# Patient Record
Sex: Male | Born: 1970 | Race: White | Hispanic: No | Smoking: Never smoker
Health system: Southern US, Community
[De-identification: ages and names within clinical notes are randomized; demographics above are authoritative.]

## PROBLEM LIST (undated history)

## (undated) DIAGNOSIS — F419 Anxiety disorder, unspecified: Secondary | ICD-10-CM

## (undated) DIAGNOSIS — F13239 Sedative, hypnotic or anxiolytic dependence with withdrawal, unspecified: Secondary | ICD-10-CM

## (undated) DIAGNOSIS — N189 Chronic kidney disease, unspecified: Secondary | ICD-10-CM

## (undated) DIAGNOSIS — N028 Recurrent and persistent hematuria with other morphologic changes: Secondary | ICD-10-CM

## (undated) DIAGNOSIS — F32A Depression, unspecified: Secondary | ICD-10-CM

## (undated) DIAGNOSIS — R011 Cardiac murmur, unspecified: Secondary | ICD-10-CM

## (undated) DIAGNOSIS — F13939 Sedative, hypnotic or anxiolytic use, unspecified with withdrawal, unspecified: Secondary | ICD-10-CM

## (undated) DIAGNOSIS — G4730 Sleep apnea, unspecified: Secondary | ICD-10-CM

## (undated) DIAGNOSIS — N02B9 Other recurrent and persistent immunoglobulin A nephropathy: Secondary | ICD-10-CM

## (undated) DIAGNOSIS — F329 Major depressive disorder, single episode, unspecified: Secondary | ICD-10-CM

## (undated) DIAGNOSIS — I10 Essential (primary) hypertension: Secondary | ICD-10-CM

## (undated) HISTORY — PX: OTHER SURGICAL HISTORY: SHX169

## (undated) HISTORY — DX: Depression, unspecified: F32.A

## (undated) HISTORY — DX: Other recurrent and persistent immunoglobulin A nephropathy: N02.B9

## (undated) HISTORY — DX: Recurrent and persistent hematuria with other morphologic changes: N02.8

## (undated) HISTORY — PX: PECTUS EXCAVATUM REPAIR: SHX437

## (undated) HISTORY — DX: Chronic kidney disease, unspecified: N18.9

## (undated) HISTORY — DX: Major depressive disorder, single episode, unspecified: F32.9

## (undated) HISTORY — DX: Cardiac murmur, unspecified: R01.1

---

## 2009-10-30 ENCOUNTER — Inpatient Hospital Stay (HOSPITAL_COMMUNITY): Admission: RE | Admit: 2009-10-30 | Discharge: 2009-11-02 | Payer: Self-pay | Admitting: Psychiatry

## 2009-10-30 ENCOUNTER — Ambulatory Visit: Payer: Self-pay | Admitting: Psychiatry

## 2010-05-11 LAB — COMPREHENSIVE METABOLIC PANEL
BUN: 16 mg/dL (ref 6–23)
Calcium: 8.6 mg/dL (ref 8.4–10.5)
Creatinine, Ser: 1.82 mg/dL — ABNORMAL HIGH (ref 0.4–1.5)
Glucose, Bld: 101 mg/dL — ABNORMAL HIGH (ref 70–99)
Sodium: 135 mEq/L (ref 135–145)
Total Protein: 7.3 g/dL (ref 6.0–8.3)

## 2010-05-11 LAB — CBC
HCT: 48.7 % (ref 39.0–52.0)
MCHC: 34.3 g/dL (ref 30.0–36.0)
MCV: 93 fL (ref 78.0–100.0)
RDW: 12.9 % (ref 11.5–15.5)

## 2010-05-11 LAB — TSH: TSH: 1.168 u[IU]/mL (ref 0.350–4.500)

## 2011-02-08 ENCOUNTER — Ambulatory Visit (INDEPENDENT_AMBULATORY_CARE_PROVIDER_SITE_OTHER): Payer: BC Managed Care – PPO

## 2011-02-08 DIAGNOSIS — Z4802 Encounter for removal of sutures: Secondary | ICD-10-CM

## 2011-10-01 ENCOUNTER — Ambulatory Visit (INDEPENDENT_AMBULATORY_CARE_PROVIDER_SITE_OTHER): Payer: BC Managed Care – PPO | Admitting: Family Medicine

## 2011-10-01 VITALS — BP 128/78 | HR 100 | Temp 98.2°F | Resp 16 | Ht 76.25 in | Wt 247.2 lb

## 2011-10-01 DIAGNOSIS — L237 Allergic contact dermatitis due to plants, except food: Secondary | ICD-10-CM

## 2011-10-01 DIAGNOSIS — L255 Unspecified contact dermatitis due to plants, except food: Secondary | ICD-10-CM

## 2011-10-01 DIAGNOSIS — R21 Rash and other nonspecific skin eruption: Secondary | ICD-10-CM

## 2011-10-01 MED ORDER — METHYLPREDNISOLONE SODIUM SUCC 125 MG IJ SOLR
125.0000 mg | Freq: Once | INTRAMUSCULAR | Status: AC
  Administered 2011-10-01: 125 mg via INTRAMUSCULAR

## 2011-10-01 MED ORDER — METHYLPREDNISOLONE 4 MG PO KIT: PACK | ORAL | Status: AC

## 2011-10-01 NOTE — Progress Notes (Signed)
[facility]:  Office Visit  Chief Complaint:  Chief Complaint  Patient presents with  . Poison Ivy    had for a while    HPI: Kelly Johnston is a 41 y.o. male who complains of  Poison ivy x 2 weeks. Developed after working out in yard. Rash behind ears, groin, legs;  Denies skin infection. Used calamine lotion without relief.  Past Medical History  Diagnosis Date  . Depression    Past Surgical History  Procedure Date  . Chest surgery    History   Social History  . Marital Status: Married    Spouse Name: N/A    Number of Children: N/A  . Years of Education: N/A   Social History Main Topics  . Smoking status: Never Smoker   . Smokeless tobacco: None  . Alcohol Use: No  . Drug Use: No  . Sexually Active: None   Other Topics Concern  . None   Social History Narrative  . None   No family history on file. No Known Allergies Prior to Admission medications   Medication Sig Start Date End Date Taking? Authorizing Provider  venlafaxine XR (EFFEXOR-XR) 150 MG 24 hr capsule Take 150 mg by mouth daily.   Yes Historical Provider, MD     ROS: The patient denies fevers, chills, night sweats, unintentional weight loss, chest pain, palpitations, wheezing, dyspnea on exertion, nausea, vomiting, abdominal pain, dysuria, hematuria, melena, numbness, weakness, or tingling.   All other systems have been reviewed and were otherwise negative with the exception of those mentioned in the HPI and as above.    PHYSICAL EXAM: Filed Vitals:   10/01/11 1314  BP: 128/78  Pulse: 109  Temp: 98.2 F (36.8 C)  Resp: 16   Filed Vitals:   10/01/11 1314  Height: 6' 4.25" (1.937 m)  Weight: 247 lb 3.2 oz (112.129 kg)   Body mass index is 29.89 kg/(m^2). Repeat 100  General: Alert, no acute distress HEENT:  Normocephalic, atraumatic, oropharynx patent.  Cardiovascular:  Regular rate and rhythm, no rubs murmurs or gallops.  No Carotid bruits, radial pulse intact. No  pedal edema.  Respiratory: Clear to auscultation bilaterally.  No wheezes, rales, or rhonchi.  No cyanosis, no use of accessory musculature GI: No organomegaly, abdomen is soft and non-tender, positive bowel sounds.  No masses. Skin: + erythematous rash, no  Signs of infection Neurologic: Facial musculature symmetric. Psychiatric: Patient is appropriate throughout our interaction. Lymphatic: No cervical lymphadenopathy Musculoskeletal: Gait intact.   LABS: Results for orders placed during the hospital encounter of 10/30/09  CBC      Component Value Range   WBC 9.7  4.0 - 10.5 K/uL   RBC 5.24  4.22 - 5.81 MIL/uL   Hemoglobin 16.7  13.0 - 17.0 g/dL   HCT 95.6  21.3 - 08.6 %   MCV 93.0  78.0 - 100.0 fL   MCH 31.9  26.0 - 34.0 pg   MCHC 34.3  30.0 - 36.0 g/dL   RDW 57.8  46.9 - 62.9 %   Platelets 321  150 - 400 K/uL  COMPREHENSIVE METABOLIC PANEL      Component Value Range   Sodium 135  135 - 145 mEq/L   Potassium 3.8  3.5 - 5.1 mEq/L   Chloride 101  96 - 112 mEq/L   CO2 27  19 - 32 mEq/L   Glucose, Bld 101 (*) 70 - 99 mg/dL   BUN 16  6 -  23 mg/dL   Creatinine, Ser 1.61 (*) 0.4 - 1.5 mg/dL   Calcium 8.6  8.4 - 09.6 mg/dL   Total Protein 7.3  6.0 - 8.3 g/dL   Albumin 4.4  3.5 - 5.2 g/dL   AST 33  0 - 37 U/L   ALT 22  0 - 53 U/L   Alkaline Phosphatase 64  39 - 117 U/L   Total Bilirubin 1.1  0.3 - 1.2 mg/dL   GFR calc non Af Amer 41 (*) >60 mL/min   GFR calc Af Amer   (*) >60 mL/min   Value: 50            The eGFR has been calculated     using the MDRD equation.     This calculation has not been     validated in all clinical     situations.     eGFR's persistently     <60 mL/min signify     possible Chronic Kidney Disease.  TSH      Component Value Range   TSH 1.168  0.350 - 4.500 uIU/mL     EKG/XRAY:   Primary read interpreted by Dr. Conley Rolls at [facility].   ASSESSMENT/PLAN: Encounter Diagnoses  Name Primary?  . Poison ivy dermatitis Yes  . Rash    Rx Medrol dose  pack Take antihistamine OTC IM Solumedrol given in clinic    Korra Christine PHUONG, DO 10/01/2011 1:30 PM

## 2011-10-04 ENCOUNTER — Ambulatory Visit (INDEPENDENT_AMBULATORY_CARE_PROVIDER_SITE_OTHER): Payer: BC Managed Care – PPO | Admitting: Emergency Medicine

## 2011-10-04 VITALS — BP 132/94 | HR 94 | Temp 98.3°F | Resp 16 | Ht 74.0 in | Wt 246.0 lb

## 2011-10-04 DIAGNOSIS — L255 Unspecified contact dermatitis due to plants, except food: Secondary | ICD-10-CM

## 2011-10-04 MED ORDER — PREDNISONE 10 MG PO TABS: ORAL_TABLET | ORAL | Status: DC

## 2011-10-04 MED ORDER — HYDROXYZINE HCL 25 MG PO TABS: 25.0000 mg | ORAL_TABLET | Freq: Every evening | ORAL | Status: AC | PRN

## 2011-10-04 NOTE — Progress Notes (Signed)
  Subjective:    Patient ID: Kelly Johnston, male    DOB: 12-19-1970, 41 y.o.   MRN: [id number]  HPI See previous visit 10/01/11  Seen for PI and given Solu Medrol 125 IM and po 4 mg methylprednisone dose pack. He states that he is much worse today and was told to come back.   He says his dose today was only 2 tablets. The rash has spread and is worse on arms trunk and groin. He can not sleep because of the itching. Benadryl has not been very helpful.     Review of Systems As noted in HPI, otherwise negative     Objective:   Physical Exam  Multiple scattered areas of vesicular patches on red base along with linear vesicular lesions. Face spared.      Assessment & Plan:  Contact Dermatitis  Change to Prednisone 10 mg 6,5,4,3,2,1 taper Add atarax 25 mg po qhs

## 2012-01-03 ENCOUNTER — Ambulatory Visit (INDEPENDENT_AMBULATORY_CARE_PROVIDER_SITE_OTHER): Payer: BC Managed Care – PPO | Admitting: Internal Medicine

## 2012-01-03 VITALS — BP 144/90 | HR 100 | Temp 98.0°F | Resp 17 | Ht 77.0 in | Wt 266.0 lb

## 2012-01-03 DIAGNOSIS — N529 Male erectile dysfunction, unspecified: Secondary | ICD-10-CM

## 2012-01-03 DIAGNOSIS — Z719 Counseling, unspecified: Secondary | ICD-10-CM

## 2012-01-03 MED ORDER — SILDENAFIL CITRATE 100 MG PO TABS: 100.0000 mg | ORAL_TABLET | Freq: Every day | ORAL | Status: DC | PRN

## 2012-01-03 NOTE — Patient Instructions (Signed)
Erectile Dysfunction  Erectile dysfunction (ED) is the inability to get a good enough erection to have sexual intercourse. ED may involve:  · Inability to get an erection.  · Lack of enough hardness to allow penetration.  · Loss of the erection before sex is finished.  · Premature ejaculation.  · Any combination of these problems if they occur more than 25% of the time.  CAUSES  · Certain drugs, such as:  · Pain relievers.  · Antihistamines.  · Antidepressants.  · Blood pressure medicines.  · Water pills.  · Ulcer medicines.  · Muscle relaxants.  · Illegal drugs.  · Excessive drinking.  · Psychological causes, such as:  · Anxiety.  · Depression.  · Sadness.  · Exhaustion.  · Performance fear.  · Stress.  · Physical causes, such as:  · Artery problems. This may include diabetes, smoking, liver disease, or atherosclerosis.  · High blood pressure.  · Hormonal problems, such as low testosterone.  · Obesity.  · Nerve problems. This may include back or pelvic injuries, diabetes, multiple sclerosis, Parkinson's disease, or some surgeries.  SYMPTOMS  · Inability to get an erection.  · Lack of enough hardness to allow penetration.  · Loss of the erection before sex is finished.  · Premature ejaculation.  · Normal erections at some times, but with frequent unsatisfactory episodes.  · Orgasms that are not satisfactory in sensation or frequency.  · Low sexual satisfaction in either partner because of erection problems.  · A curved penis occurring with erection. The curve may cause pain or may be too curved to allow for intercourse.  · Never having nighttime erections.  DIAGNOSIS  Your caregiver can often diagnose this condition by:  · Performing a physical exam to find other diseases or specific problems with the penis.  · Asking you detailed questions about the problem.  · Performing blood tests to check for diabetes or to measure hormone levels.  · Performing urine tests to find other underlying health  conditions.  · Performing an ultrasound to check for scarring.  · Performing a test to check blood flow to the penis.  · Doing a sleep study at home to measure nighttime erections.  TREATMENT   · You may be prescribed medicines by mouth.  · You may be given medicine injections into the penis.  · You may be prescribed a vacuum pump with a ring.  · Penile implant surgery may be performed. You may receive:  · An inflatable implant.  · A semi-rigid implant.  · Blood vessel surgery may be performed.  HOME CARE INSTRUCTIONS  · Take all medicine as directed by your caregiver. Do not take any other medicines without talking to your caregiver first.  · Follow your caregiver's directions for specific treatments as prescribed.  · Follow up with your caregiver as directed.  Document Released: 02/10/2000 Document Revised: 05/07/2011 Document Reviewed: 06/04/2010  [employer]® Patient Information ©2013 [employer].

## 2012-01-03 NOTE — Progress Notes (Signed)
  Subjective:    Patient ID: Kelly Johnston, male    DOB: 1970/10/14, 41 y.o.   MRN: [id number]  HPI ED using viagra with success. No side affects, no other issues    Review of Systems     Objective:   Physical Exam nl       Assessment & Plan:  SED/viagra rf

## 2012-01-10 ENCOUNTER — Encounter (HOSPITAL_COMMUNITY): Payer: Self-pay | Admitting: Emergency Medicine

## 2012-01-10 ENCOUNTER — Emergency Department (HOSPITAL_COMMUNITY)
Admission: EM | Admit: 2012-01-10 | Discharge: 2012-01-10 | Disposition: A | Discharge status: Home / Self Care | Payer: BC Managed Care – PPO | Attending: Emergency Medicine | Admitting: Emergency Medicine

## 2012-01-10 ENCOUNTER — Ambulatory Visit (INDEPENDENT_AMBULATORY_CARE_PROVIDER_SITE_OTHER): Payer: BC Managed Care – PPO | Admitting: Family Medicine

## 2012-01-10 VITALS — BP 122/85 | HR 95 | Temp 98.5°F | Resp 16 | Ht 75.75 in | Wt 260.0 lb

## 2012-01-10 DIAGNOSIS — N028 Recurrent and persistent hematuria with other morphologic changes: Secondary | ICD-10-CM

## 2012-01-10 DIAGNOSIS — R1110 Vomiting, unspecified: Secondary | ICD-10-CM

## 2012-01-10 DIAGNOSIS — F3289 Other specified depressive episodes: Secondary | ICD-10-CM | POA: Insufficient documentation

## 2012-01-10 DIAGNOSIS — L0291 Cutaneous abscess, unspecified: Secondary | ICD-10-CM

## 2012-01-10 DIAGNOSIS — Z79899 Other long term (current) drug therapy: Secondary | ICD-10-CM | POA: Insufficient documentation

## 2012-01-10 DIAGNOSIS — L03116 Cellulitis of left lower limb: Secondary | ICD-10-CM

## 2012-01-10 DIAGNOSIS — R6889 Other general symptoms and signs: Secondary | ICD-10-CM | POA: Insufficient documentation

## 2012-01-10 DIAGNOSIS — L02419 Cutaneous abscess of limb, unspecified: Secondary | ICD-10-CM | POA: Insufficient documentation

## 2012-01-10 DIAGNOSIS — N189 Chronic kidney disease, unspecified: Secondary | ICD-10-CM | POA: Insufficient documentation

## 2012-01-10 DIAGNOSIS — F329 Major depressive disorder, single episode, unspecified: Secondary | ICD-10-CM | POA: Insufficient documentation

## 2012-01-10 DIAGNOSIS — R238 Other skin changes: Secondary | ICD-10-CM

## 2012-01-10 LAB — POCT CBC
Lymph, poc: 2.6 (ref 0.6–3.4)
MCH, POC: 29.1 pg (ref 27–31.2)
MCV: 95.7 fL (ref 80–97)
MID (cbc): 1.6 — AB (ref 0–0.9)
POC LYMPH PERCENT: 12.2 %L (ref 10–50)
Platelet Count, POC: 356 10*3/uL (ref 142–424)
RDW, POC: 13.1 %
WBC: 21.6 10*3/uL — AB (ref 4.6–10.2)

## 2012-01-10 MED ORDER — VANCOMYCIN HCL IN DEXTROSE 1-5 GM/200ML-% IV SOLN
1000.0000 mg | Freq: Once | INTRAVENOUS | Status: AC
  Administered 2012-01-10: 1000 mg via INTRAVENOUS
  Filled 2012-01-10: qty 200

## 2012-01-10 MED ORDER — SODIUM CHLORIDE 0.9 % IV BOLUS (SEPSIS)
1000.0000 mL | Freq: Once | INTRAVENOUS | Status: AC
  Administered 2012-01-10: 1000 mL via INTRAVENOUS

## 2012-01-10 MED ORDER — OXYCODONE-ACETAMINOPHEN 5-325 MG PO TABS: 1.0000 | ORAL_TABLET | ORAL | Status: DC | PRN

## 2012-01-10 MED ORDER — SULFAMETHOXAZOLE-TRIMETHOPRIM 800-160 MG PO TABS: 1.0000 | ORAL_TABLET | Freq: Two times a day (BID) | ORAL | Status: DC

## 2012-01-10 MED ORDER — KETOROLAC TROMETHAMINE 15 MG/ML IJ SOLN
15.0000 mg | Freq: Once | INTRAMUSCULAR | Status: AC
  Administered 2012-01-10: 15 mg via INTRAVENOUS
  Filled 2012-01-10: qty 1

## 2012-01-10 NOTE — Progress Notes (Signed)
[facility] [address] [place] [address] [phone]  Date:  01/10/2012   Name:  Kelly Johnston   DOB:  1971/02/14   MRN:  [id number]  PCP:  No primary provider on file.    Chief Complaint: cellulitis   History of Present Illness:  Kelly Johnston is a 41 y.o. very pleasant male patient who presents with the following:  Here today with illness- he has noted a fever and chills/ sweats/ malaise for about 2 days. He also has cellulitis of his left thigh which he thinks could be the culprit. He is not aware of any injury to the area or anything that could have caused the cellulitis.  He has never had this in the past.  He has not noted a ST, cough, or any other specific symptoms.  He has not been having any nausea or vomiting.  However, today while in triage he felt very ill and faint, and nearly passed out.  He was transferred to a room by [facility] and an IV was started for hydration   He has a history of IgA nephropathy.  He is seen at [facility], and states that this condition is stable and not generally troublesome to him.  No recent labs available.  He is otherwise generally healthy.   He is married and has 3 children.  His wife is out of town today.    There is no problem list on file for this patient.   Past Medical History  Diagnosis Date  . Depression   . Chronic kidney disease   . Heart murmur     Past Surgical History  Procedure Date  . Chest surgery   . Pectus excavatum repair     History  Substance Use Topics  . Smoking status: Never Smoker   . Smokeless tobacco: Not on file  . Alcohol Use: No    No family history on file.  No Known Allergies  Medication list has been reviewed and updated.  Current Outpatient Prescriptions on File Prior to Visit  Medication Sig Dispense Refill  . liothyronine (CYTOMEL) 25 MCG tablet Take 25 mcg by mouth daily.      . mirtazapine (REMERON) 30 MG tablet Take 30 mg by mouth at bedtime.      .  predniSONE (DELTASONE) 10 MG tablet 6,5,4,3,2,1 taper  21 tablet  0  . sildenafil (VIAGRA) 100 MG tablet Take 100 mg by mouth daily as needed.      . sildenafil (VIAGRA) 100 MG tablet Take 1 tablet (100 mg total) by mouth daily as needed for erectile dysfunction.  10 tablet  3  . venlafaxine XR (EFFEXOR-XR) 150 MG 24 hr capsule Take 150 mg by mouth daily.      . verapamil (CALAN) 40 MG tablet Take 40 mg by mouth 3 (three) times daily.        Review of Systems:  As per HPI- otherwise negative.   Physical Examination: Filed Vitals:   01/10/12 1032  Pulse: 114  Temp: 97.8 F (36.6 C)  Resp: 16   Filed Vitals:   01/10/12 1032  Height: 6' 3.75" (1.924 m)  Weight: 260 lb (117.935 kg)   Body mass index is 31.86 kg/(m^2). Ideal Body Weight: Weight in (lb) to have BMI = 25: 203.6   GEN: WDWN, NAD, Non-toxic, A & O x 3- clammy and pale in triage. slightly overweight HEENT: Atraumatic, Normocephalic. Neck supple. No masses, No LAD. Bilateral TM wnl,  oropharynx normal.  PEERL,EOMI.   Ears and Nose: No external deformity. CV: RRR, No M/G/R. No JVD. No thrill. No extra heart sounds. PULM: CTA B, no wheezes, crackles, rhonchi. No retractions. No resp. distress. No accessory muscle use. ABD: S, NT, ND, +BS. No rebound. No HSM. EXTR: No c/c/e. Left proximal inner thigh shows an area of cellulitis about 4X 6 inches in size.  No apparent fluctuance or wound, no drainage NEURO Normal gait once recovered from pre- syncopal episode  PSYCH: Normally interactive. Conversant. Not depressed or anxious appearing.  Calm demeanor.   IV started left AC with NS for hydration.  Pt laid down and felt better- drank apple juice.  VS checked regularly.    Discussed attempting an I and D on left leg.  He would like to proceed.  Area prepped with betadine and alcohol, anesthesia with 4cc of 1% lidocaine.  Small incision made with 11 blade and pus released immediately.  Culture taken. A small to moderate amount of  pus was expressed.  Explored wound superficially with a curved hemostat but no more loculations or pus found.  Small piece of packing placed and wound dressed.    Results for orders placed in visit on 01/10/12  POCT CBC      Component Value Range   WBC 21.6 (*) 4.6 - 10.2 K/uL   Lymph, poc 2.6  0.6 - 3.4   POC LYMPH PERCENT 12.2  10 - 50 %L   MID (cbc) 1.6 (*) 0 - 0.9   POC MID % 7.6  0 - 12 %M   POC Granulocyte 17.3 (*) 2 - 6.9   Granulocyte percent 80.2 (*) 37 - 80 %G   RBC 5.73  4.69 - 6.13 M/uL   Hemoglobin 16.7  14.1 - 18.1 g/dL   HCT, POC 45.4 (*) 09.8 - 53.7 %   MCV 95.7  80 - 97 fL   MCH, POC 29.1  27 - 31.2 pg   MCHC 30.5 (*) 31.8 - 35.4 g/dL   RDW, POC 11.9     Platelet Count, POC 356  142 - 424 K/uL   MPV 8.7  0 - 99.8 fL    Assessment and Plan: 1. Abscess  Wound culture  2. Vomiting  POCT CBC   Kelly Johnston is here with an abscess/ cellulitis of his left leg, malaise and leukocytosis today.  Due to the location of his abscess I do not want to explore it aggressively in the office.  I do suspect that there is more pus given the severity of his symptoms and high wbc count.  Called and spoke with MD on call for [facility] who recommended that he have further evaluation at ED, as he may need a surgical consult and/ or admission for IV abx.    Offered to call EMS for transport.  However, Kelly Johnston declined this and wants have a friend drive him to the hospital.  His VS are stable and he is no longer clammy or pre- syncopal.  A friend arrived at clinic who can drive him to [facility].  They will proceed straight to the [facility]  ED. Called ahead no notify charge nurse.    I spent more than 45 minutes in direct patient care   Abbe Amsterdam, MD

## 2012-01-10 NOTE — ED Provider Notes (Signed)
History    41 year old male with left thigh pain. Onset on Monday and progressively worsening. Patient felt initially had an ingrown hair. Progressively larger more painful lesion since then. Surrounding redness. Seen in urgent care and reports that he incised and drained an abscess. Please refer to emergency room for further evaluation. He is not sure what specifically was their concern. Subjective fever. No nausea or vomiting. No history of diabetes or other immunocompromising state.   CSN: [id number]  Arrival date & time 01/10/12  1221   First MD Initiated Contact with Patient 01/10/12 1240      Chief Complaint  Patient presents with  . Cellulitis    LLE  . Abnormal Lab    WBC 21    (Consider location/radiation/quality/duration/timing/severity/associated sxs/prior treatment) HPI  Past Medical History  Diagnosis Date  . Depression   . Chronic kidney disease   . Heart murmur     Past Surgical History  Procedure Date  . Chest surgery   . Pectus excavatum repair     History reviewed. No pertinent family history.  History  Substance Use Topics  . Smoking status: Never Smoker   . Smokeless tobacco: Not on file  . Alcohol Use: No      Review of Systems   Review of symptoms negative unless otherwise noted in HPI.   Allergies  Review of patient's allergies indicates no known allergies.  Home Medications   Current Outpatient Rx  Name  Route  Sig  Dispense  Refill  . LIOTHYRONINE SODIUM 25 MCG PO TABS   Oral   Take 25 mcg by mouth daily.         Marland Kitchen MIRTAZAPINE 30 MG PO TABS   Oral   Take 30 mg by mouth at bedtime.         Marland Kitchen SILDENAFIL CITRATE 100 MG PO TABS   Oral   Take 100 mg by mouth daily as needed.         Marland Kitchen SILDENAFIL CITRATE 100 MG PO TABS   Oral   Take 1 tablet (100 mg total) by mouth daily as needed for erectile dysfunction.   10 tablet   3   . VENLAFAXINE HCL ER 150 MG PO CP24   Oral   Take 150 mg by mouth daily.         Marland Kitchen  VERAPAMIL HCL 40 MG PO TABS   Oral   Take 40 mg by mouth 3 (three) times daily.           BP 130/79  Pulse 108  Temp 98.3 F (36.8 C) (Oral)  Resp 18  SpO2 97%  Physical Exam  Nursing note and vitals reviewed. Constitutional: He appears well-developed and well-nourished. No distress.  HENT:  Head: Normocephalic and atraumatic.  Eyes: Conjunctivae normal are normal. Right eye exhibits no discharge. Left eye exhibits no discharge.  Neck: Neck supple.  Cardiovascular: Normal rate, regular rhythm and normal heart sounds.  Exam reveals no gallop and no friction rub.   No murmur heard. Pulmonary/Chest: Effort normal and breath sounds normal. No respiratory distress.  Abdominal: Soft. He exhibits no distension. There is no tenderness.  Musculoskeletal: He exhibits no edema and no tenderness.  Neurological: He is alert.  Skin:       Proximal, medial left thigh with recent incision and drainage. Packing is in place. Surrounding the packing there is only 3 cm of induration and outside of this there is approximately another 4-5 cm of erythema. Area with increased  warmth and tenderness. Ultrasound was performed. No evidence of persistent drainable collection. NV Intact distally. No inguinal adenopathy.  Psychiatric: He has a normal mood and affect. His behavior is normal. Thought content normal.    ED Course  Procedures (including critical care time)  Labs Reviewed - No data to display No results found.   1. Cellulitis of left thigh       MDM  Male with abscess and surrounding cellulitis of the left thigh. Reports subjective fever but is afebrile the emergency room. No vomiting. No history of diabetes or other immunocompromising state. Do not feel that labs or imaging would alter treatment or disposition at this time so deferred. And a dose of IV vancomycin. He will be discharged with Bactrim. Likely MRSA given abscess formation. Return precautions discussed and also the need for  re-check in 24-48 hours.       Raeford Razor, MD 01/11/12 1620

## 2012-01-10 NOTE — Patient Instructions (Addendum)
Go to [facility] for further treatment

## 2012-01-10 NOTE — ED Notes (Signed)
Pt reports infection to left upper leg since Monday, seen at [facility] today and advised to come here for elevated WBC.

## 2012-01-12 ENCOUNTER — Encounter (HOSPITAL_COMMUNITY): Payer: Self-pay | Admitting: *Deleted

## 2012-01-12 ENCOUNTER — Inpatient Hospital Stay (HOSPITAL_COMMUNITY)
Admission: EM | Admit: 2012-01-12 | Discharge: 2012-01-17 | DRG: 277 | Disposition: A | Discharge status: Home / Self Care | Payer: BC Managed Care – PPO | Attending: General Surgery | Admitting: General Surgery

## 2012-01-12 DIAGNOSIS — N189 Chronic kidney disease, unspecified: Secondary | ICD-10-CM | POA: Diagnosis present

## 2012-01-12 DIAGNOSIS — L03116 Cellulitis of left lower limb: Secondary | ICD-10-CM

## 2012-01-12 DIAGNOSIS — L0390 Cellulitis, unspecified: Secondary | ICD-10-CM

## 2012-01-12 DIAGNOSIS — D72829 Elevated white blood cell count, unspecified: Secondary | ICD-10-CM | POA: Diagnosis present

## 2012-01-12 DIAGNOSIS — N028 Recurrent and persistent hematuria with other morphologic changes: Secondary | ICD-10-CM

## 2012-01-12 DIAGNOSIS — F3289 Other specified depressive episodes: Secondary | ICD-10-CM | POA: Diagnosis present

## 2012-01-12 DIAGNOSIS — L0291 Cutaneous abscess, unspecified: Secondary | ICD-10-CM

## 2012-01-12 DIAGNOSIS — Z79899 Other long term (current) drug therapy: Secondary | ICD-10-CM

## 2012-01-12 DIAGNOSIS — Z8614 Personal history of Methicillin resistant Staphylococcus aureus infection: Secondary | ICD-10-CM

## 2012-01-12 DIAGNOSIS — R011 Cardiac murmur, unspecified: Secondary | ICD-10-CM | POA: Diagnosis present

## 2012-01-12 DIAGNOSIS — L02416 Cutaneous abscess of left lower limb: Secondary | ICD-10-CM | POA: Diagnosis present

## 2012-01-12 DIAGNOSIS — L02419 Cutaneous abscess of limb, unspecified: Principal | ICD-10-CM | POA: Diagnosis present

## 2012-01-12 DIAGNOSIS — F329 Major depressive disorder, single episode, unspecified: Secondary | ICD-10-CM | POA: Diagnosis present

## 2012-01-12 DIAGNOSIS — Z87891 Personal history of nicotine dependence: Secondary | ICD-10-CM

## 2012-01-12 DIAGNOSIS — N058 Unspecified nephritic syndrome with other morphologic changes: Secondary | ICD-10-CM | POA: Diagnosis present

## 2012-01-12 DIAGNOSIS — F32A Depression, unspecified: Secondary | ICD-10-CM | POA: Diagnosis present

## 2012-01-12 DIAGNOSIS — N059 Unspecified nephritic syndrome with unspecified morphologic changes: Secondary | ICD-10-CM

## 2012-01-12 LAB — CBC
HCT: 44.2 % (ref 39.0–52.0)
Hemoglobin: 15 g/dL (ref 13.0–17.0)
RDW: 12.4 % (ref 11.5–15.5)
WBC: 13.3 10*3/uL — ABNORMAL HIGH (ref 4.0–10.5)

## 2012-01-12 LAB — WOUND CULTURE

## 2012-01-12 LAB — BASIC METABOLIC PANEL
BUN: 14 mg/dL (ref 6–23)
Chloride: 102 mEq/L (ref 96–112)
GFR calc Af Amer: 88 mL/min — ABNORMAL LOW (ref >90)
Potassium: 3.5 mEq/L (ref 3.5–5.1)
Sodium: 136 mEq/L (ref 135–145)

## 2012-01-12 MED ORDER — LIOTHYRONINE SODIUM 25 MCG PO TABS
25.0000 ug | ORAL_TABLET | Freq: Every day | ORAL | Status: DC
  Administered 2012-01-13 – 2012-01-17 (×4): 25 ug via ORAL
  Filled 2012-01-12 (×5): qty 1

## 2012-01-12 MED ORDER — VERAPAMIL HCL 40 MG PO TABS
40.0000 mg | ORAL_TABLET | Freq: Three times a day (TID) | ORAL | Status: DC
  Administered 2012-01-12 – 2012-01-17 (×14): 40 mg via ORAL
  Filled 2012-01-12 (×17): qty 1

## 2012-01-12 MED ORDER — ONDANSETRON HCL 4 MG/2ML IJ SOLN: 4.0000 mg | Freq: Four times a day (QID) | INTRAMUSCULAR | Status: DC | PRN

## 2012-01-12 MED ORDER — MIRTAZAPINE 30 MG PO TABS
30.0000 mg | ORAL_TABLET | Freq: Every day | ORAL | Status: DC
  Administered 2012-01-12 – 2012-01-16 (×5): 30 mg via ORAL
  Filled 2012-01-12 (×6): qty 1

## 2012-01-12 MED ORDER — HYDROMORPHONE HCL PF 1 MG/ML IJ SOLN
1.0000 mg | Freq: Once | INTRAMUSCULAR | Status: AC
  Administered 2012-01-12: 1 mg via INTRAVENOUS
  Filled 2012-01-12: qty 1

## 2012-01-12 MED ORDER — ENOXAPARIN SODIUM 60 MG/0.6ML ~~LOC~~ SOLN
60.0000 mg | SUBCUTANEOUS | Status: DC
  Administered 2012-01-12 – 2012-01-15 (×4): 60 mg via SUBCUTANEOUS
  Filled 2012-01-12 (×5): qty 0.6

## 2012-01-12 MED ORDER — ONDANSETRON HCL 4 MG PO TABS: 4.0000 mg | ORAL_TABLET | Freq: Four times a day (QID) | ORAL | Status: DC | PRN

## 2012-01-12 MED ORDER — OXYCODONE-ACETAMINOPHEN 5-325 MG PO TABS
1.0000 | ORAL_TABLET | ORAL | Status: DC | PRN
  Administered 2012-01-12 – 2012-01-15 (×2): 1 via ORAL
  Filled 2012-01-12 (×3): qty 1

## 2012-01-12 MED ORDER — VANCOMYCIN HCL IN DEXTROSE 1-5 GM/200ML-% IV SOLN
1000.0000 mg | Freq: Once | INTRAVENOUS | Status: DC
  Filled 2012-01-12: qty 200

## 2012-01-12 MED ORDER — SODIUM CHLORIDE 0.9 % IV BOLUS (SEPSIS): 1000.0000 mL | Freq: Once | INTRAVENOUS | Status: DC

## 2012-01-12 MED ORDER — PIPERACILLIN-TAZOBACTAM 3.375 G IVPB
3.3750 g | Freq: Once | INTRAVENOUS | Status: AC
  Administered 2012-01-12: 3.375 g via INTRAVENOUS
  Filled 2012-01-12: qty 50

## 2012-01-12 MED ORDER — ENOXAPARIN SODIUM 40 MG/0.4ML ~~LOC~~ SOLN: 40.0000 mg | SUBCUTANEOUS | Status: DC

## 2012-01-12 MED ORDER — HYDROMORPHONE HCL PF 1 MG/ML IJ SOLN
1.0000 mg | INTRAMUSCULAR | Status: DC | PRN
  Administered 2012-01-13 – 2012-01-14 (×6): 1 mg via INTRAVENOUS
  Filled 2012-01-12 (×6): qty 1

## 2012-01-12 MED ORDER — SODIUM CHLORIDE 0.9 % IV BOLUS (SEPSIS)
1000.0000 mL | Freq: Once | INTRAVENOUS | Status: AC
  Administered 2012-01-12: 1000 mL via INTRAVENOUS

## 2012-01-12 MED ORDER — ONDANSETRON HCL 4 MG/2ML IJ SOLN
4.0000 mg | Freq: Once | INTRAMUSCULAR | Status: AC
  Administered 2012-01-12: 4 mg via INTRAVENOUS
  Filled 2012-01-12: qty 2

## 2012-01-12 MED ORDER — HYDROMORPHONE HCL PF 1 MG/ML IJ SOLN: 1.0000 mg | Freq: Once | INTRAMUSCULAR | Status: DC

## 2012-01-12 MED ORDER — VENLAFAXINE HCL ER 150 MG PO CP24
150.0000 mg | ORAL_CAPSULE | Freq: Every day | ORAL | Status: DC
  Administered 2012-01-13 – 2012-01-17 (×4): 150 mg via ORAL
  Filled 2012-01-12 (×5): qty 1

## 2012-01-12 NOTE — ED Notes (Signed)
Thought he had an ingrown toenail on Monday. Seen Thurs @ [facility] for abscessed [facility] ED had bedside ultrasound. Area is hot, red/purplishes.  Stated area is remains the same but not better.

## 2012-01-12 NOTE — H&P (Signed)
[facility] History and Physical  Kelly Johnston ZOX:[id number] DOB: 12/22/1970 DOA: 01/12/2012  Referring physician: dr Denton Lank PCP: No primary provider on file.    Chief Complaint: left thigh cellulitis.  HPI: Kelly Johnston is a 41 y.o. male with h/o depression, CKD, Ig A nephropathy, had a area of redness on the left thigh , thought it was a pimple vs ingrown hair, on Monday, but it spread to the surrounding area with cellulitic picture, went to urgent care on Thursday and underwent an I& D done, growing MSSA . He was on bactrim the last 2 days, but wasasked to come to ED today. He reports subjective fever, pain in the area. No other complaints.   Review of Systems: The patient denies anorexia, fever, weight loss,, vision loss, decreased hearing, hoarseness, chest pain, syncope, dyspnea on exertion, peripheral edema, balance deficits, hemoptysis, abdominal pain, melena, hematochezia, severe indigestion/heartburn, hematuria, incontinence, genital sores, muscle weakness, suspicious skin lesions, transient blindness, difficulty walking, depression, unusual weight change, abnormal bleeding, enlarged lymph nodes, angioedema, and breast masses.    Past Medical History  Diagnosis Date  . Depression   . Chronic kidney disease   . Heart murmur    Past Surgical History  Procedure Date  . Chest surgery   . Pectus excavatum repair    Social History:  reports that he has quit smoking. His smoking use included Cigarettes. He has a .2 pack-year smoking history. He has never used smokeless tobacco. He reports that he does not drink alcohol or use illicit drugs.  where does patient live--home,   No Known Allergies  Family History  Problem Relation Age of Onset  . Testicular cancer Father     Prior to Admission medications   Medication Sig Start Date End Date Taking? Authorizing Provider  liothyronine (CYTOMEL) 25 MCG tablet Take 25 mcg by mouth daily.   Yes Historical Provider, MD    mirtazapine (REMERON) 30 MG tablet Take 30 mg by mouth at bedtime.   Yes Historical Provider, MD  oxyCODONE-acetaminophen (PERCOCET/ROXICET) 5-325 MG per tablet Take 1 tablet by mouth every 4 (four) hours as needed for pain. 01/10/12  Yes Raeford Razor, MD  sulfamethoxazole-trimethoprim (BACTRIM DS,SEPTRA DS) 800-160 MG per tablet Take 1 tablet by mouth every 12 (twelve) hours. Pt to take for 7 days. Pt is on day 2 of therapy 01/10/12  Yes Raeford Razor, MD  venlafaxine XR (EFFEXOR-XR) 150 MG 24 hr capsule Take 150 mg by mouth daily.   Yes Historical Provider, MD  verapamil (CALAN) 40 MG tablet Take 40 mg by mouth 3 (three) times daily.   Yes Historical Provider, MD  sildenafil (VIAGRA) 100 MG tablet Take 100 mg by mouth daily as needed.    Historical Provider, MD  sildenafil (VIAGRA) 100 MG tablet Take 1 tablet (100 mg total) by mouth daily as needed for erectile dysfunction. 01/03/12   Jonita Albee, MD   Physical Exam: Filed Vitals:   01/12/12 1124 01/12/12 1200 01/12/12 1300 01/12/12 1507  BP: 140/82 138/92 143/93 158/89  Pulse: 108  96 96  Temp: 99.2 F (37.3 C)  98.6 F (37 C) 98.4 F (36.9 C)  TempSrc:    Oral  Resp:    15  Height:    6' 3.59" (1.92 m)  Weight:    117.9 kg (259 lb 14.8 oz)  SpO2:    100%    Constitutional: Vital signs reviewed.  Patient is a well-developed and well-nourished  in no acute distress and  cooperative with exam. Alert and oriented x3.  Head: Normocephalic and atraumatic Ear: TM normal bilaterally Mouth: no erythema or exudates, MMM Eyes: PERRL, EOMI, conjunctivae normal, No scleral icterus.  Neck: Supple, Trachea midline normal ROM, No JVD, mass, thyromegaly, or carotid bruit present.  Cardiovascular: RRR, S1 normal, S2 normal, no MRG, pulses symmetric and intact bilaterally Pulmonary/Chest: CTAB, no wheezes, rales, or rhonchi Abdominal: Soft. Non-tender, non-distended, bowel sounds are normal, no masses, organomegaly, or guarding present.  GU: no  CVA tenderness Musculoskeletal: left thigh erythema associated with induration and a 3 inch incision area bandaged.  Hematology: no cervical, inginal, or axillary adenopathy.  Neurological: A&O x3, Strength is normal and symmetric bilaterally, cranial nerve II-XII are grossly intact, no focal motor deficit, sensory intact to light touch bilaterally.     Labs on Admission:  Basic Metabolic Panel:  Lab 01/12/12 1610  NA 136  K 3.5  CL 102  CO2 24  GLUCOSE 114*  BUN 14  CREATININE 1.16  CALCIUM 8.9  MG --  PHOS --   Liver Function Tests: No results found for this basename: AST:5,ALT:5,ALKPHOS:5,BILITOT:5,PROT:5,ALBUMIN:5 in the last 168 hours No results found for this basename: LIPASE:5,AMYLASE:5 in the last 168 hours No results found for this basename: AMMONIA:5 in the last 168 hours CBC:  Lab 01/12/12 1115 01/10/12 1047  WBC 13.3* 21.6*  NEUTROABS -- --  HGB 15.0 16.7  HCT 44.2 54.8*  MCV 89.5 95.7  PLT 292 --   Cardiac Enzymes: No results found for this basename: CKTOTAL:5,CKMB:5,CKMBINDEX:5,TROPONINI:5 in the last 168 hours  BNP (last 3 results) No results found for this basename: PROBNP:3 in the last 8760 hours CBG: No results found for this basename: GLUCAP:5 in the last 168 hours  Radiological Exams on Admission: No results found.    Assessment/Plan Active Problems:   1. Cellulitis & Abscess of the left thigh: -s/p I&d. Wound cultures from Thursday grew staph aureas on vancomycin and zosyn.  - elevated the leg.  - x ray of the left thigh.  - if the cellulitis doesn't improve, please obtain CT of the left thigh and call surgery for further recommendations.   2. CKD: on verapamil.   3. Depression: no suicidal ideation.   4. Leukocytosis: probably fromt he left thigh abscess and cellulitis.   DVT prophylaxis  Code Status: full code Family Communication: none at bedside Disposition Plan: 1 to 2days   [facility] Pager  [phone]  If 7PM-7AM, please contact night-coverage [email or web address] Password [facility] 01/12/2012, 5:31 PM

## 2012-01-12 NOTE — ED Provider Notes (Addendum)
History     CSN: [id number]  Arrival date & time 01/12/12  1036   First MD Initiated Contact with Patient 01/12/12 1101      Chief Complaint  Patient presents with  . Recurrent Skin Infections    (Consider location/radiation/quality/duration/timing/severity/associated sxs/prior treatment) The history is provided by the patient.  pt c/o area redness, pain, tenderness to left proximal thigh for past 5 days. Started as small red spot, pt states thought was ingrown hair. No injury to area. No bite or sting. States since then increased swelling and spreading redness to area. Subjective fevers. Chills/sweats. Was at [facility] urgent care, and states very tiny incision made, pt was referred to ED then. In ED, no I and D was done, and pt sent home on bactrim. Pt states swelling and redness persist despite abx tx. No hx diabetes. No hx prior abscess/boils/mrsa. Pain constant, dull, non radiating, worse w palpation.      Past Medical History  Diagnosis Date  . Depression   . Chronic kidney disease   . Heart murmur     Past Surgical History  Procedure Date  . Chest surgery   . Pectus excavatum repair     Family History  Problem Relation Age of Onset  . Testicular cancer Father     History  Substance Use Topics  . Smoking status: Former Smoker -- 0.1 packs/day for 2 years    Types: Cigarettes  . Smokeless tobacco: Never Used  . Alcohol Use: No      Review of Systems  Constitutional: Positive for fever and chills.  HENT: Negative for neck pain.   Eyes: Negative for redness.  Respiratory: Negative for shortness of breath.   Cardiovascular: Negative for chest pain.  Gastrointestinal: Negative for abdominal pain.  Genitourinary: Negative for flank pain.  Musculoskeletal: Negative for back pain.  Skin: Negative for rash.  Neurological: Negative for headaches.  Hematological: Does not bruise/bleed easily.  Psychiatric/Behavioral: Negative for confusion.    Allergies    Review of patient's allergies indicates no known allergies.  Home Medications   Current Outpatient Rx  Name  Route  Sig  Dispense  Refill  . LIOTHYRONINE SODIUM 25 MCG PO TABS   Oral   Take 25 mcg by mouth daily.         Marland Kitchen MIRTAZAPINE 30 MG PO TABS   Oral   Take 30 mg by mouth at bedtime.         . OXYCODONE-ACETAMINOPHEN 5-325 MG PO TABS   Oral   Take 1 tablet by mouth every 4 (four) hours as needed for pain.   10 tablet   0   . SULFAMETHOXAZOLE-TRIMETHOPRIM 800-160 MG PO TABS   Oral   Take 1 tablet by mouth every 12 (twelve) hours. Pt to take for 7 days. Pt is on day 2 of therapy         . VENLAFAXINE HCL ER 150 MG PO CP24   Oral   Take 150 mg by mouth daily.         Marland Kitchen VERAPAMIL HCL 40 MG PO TABS   Oral   Take 40 mg by mouth 3 (three) times daily.         Marland Kitchen SILDENAFIL CITRATE 100 MG PO TABS   Oral   Take 100 mg by mouth daily as needed.         Marland Kitchen SILDENAFIL CITRATE 100 MG PO TABS   Oral   Take 1 tablet (100 mg total) by mouth  daily as needed for erectile dysfunction.   10 tablet   3     BP 147/86  Pulse 125  Temp 98.2 F (36.8 C) (Oral)  Resp 14  SpO2 98%  Physical Exam  Nursing note and vitals reviewed. Constitutional: He is oriented to person, place, and time. He appears well-developed and well-nourished. No distress.  HENT:  Head: Atraumatic.  Eyes: Pupils are equal, round, and reactive to light.  Neck: Neck supple. No tracheal deviation present.  Cardiovascular: Regular rhythm, normal heart sounds and intact distal pulses.   Pulmonary/Chest: Effort normal and breath sounds normal. No accessory muscle usage. No respiratory distress.  Abdominal: Soft. Bowel sounds are normal. He exhibits no distension.  Genitourinary:       No cva tenderness. No scrotal or testicular tenderness   Musculoskeletal: Normal range of motion.       Pt w area induration proximal left thigh w small shallow ulcer centrally draining small amt pus, around  indurated area is surrounding cellulitis. Cellulitis/pain/tenderness does not extend to groin or to scrotum/testicle. No crepitus.   Neurological: He is alert and oriented to person, place, and time.  Skin: Skin is warm and dry.  Psychiatric: He has a normal mood and affect.    ED Course  Procedures (including critical care time)   1. Abscess   2. Cellulitis    Results for orders placed during the hospital encounter of 01/12/12  CBC      Component Value Range   WBC 13.3 (*) 4.0 - 10.5 K/uL   RBC 4.94  4.22 - 5.81 MIL/uL   Hemoglobin 15.0  13.0 - 17.0 g/dL   HCT 40.1  02.7 - 25.3 %   MCV 89.5  78.0 - 100.0 fL   MCH 30.4  26.0 - 34.0 pg   MCHC 33.9  30.0 - 36.0 g/dL   RDW 66.4  40.3 - 47.4 %   Platelets 292  150 - 400 K/uL  BASIC METABOLIC PANEL      Component Value Range   Sodium 136  135 - 145 mEq/L   Potassium 3.5  3.5 - 5.1 mEq/L   Chloride 102  96 - 112 mEq/L   CO2 24  19 - 32 mEq/L   Glucose, Bld 114 (*) 70 - 99 mg/dL   BUN 14  6 - 23 mg/dL   Creatinine, Ser 2.59  0.50 - 1.35 mg/dL   Calcium 8.9  8.4 - 56.3 mg/dL   GFR calc non Af Amer 76 (*) >90 mL/min   GFR calc Af Amer 88 (*) >90 mL/min      MDM  Iv ns bolus. Dilaudid 1 mg iv. zofran iv.  Will I and D abscess.  Reviewed nursing notes and prior charts for additional history.   INCISION AND DRAINAGE Performed by: Suzi Roots Consent: Verbal consent obtained. Risks and benefits: risks, benefits and alternatives were discussed Type: abscess  Body area: left proximal thigh  Anesthesia: local infiltration  Local anesthetic: lidocaine 2% w epinephrine  Anesthetic total: 10 ml  Complexity: complex Blunt dissection to break up loculations  Drainage: purulent  Drainage amount: large  Packing material: 1/2 in iodoform gauze  Patient tolerance: Patient tolerated the procedure well with no immediate complications.    Sterile dressing.  Additional dilaudid 1 mg iv for pain.  Zosyn and vanc  iv.   Given persistent/worsening abscess/cellulitis despite outpt abx, will admit.          Suzi Roots, MD 01/12/12 [phone]  Suzi Roots, MD 01/12/12 [phone]

## 2012-01-12 NOTE — ED Notes (Signed)
Admitting hospitalist @ bedside.

## 2012-01-12 NOTE — ED Notes (Signed)
Pt states that he began to have a small bump on L groin area 5 days ago and began to have flu-like symptoms on Wed. He presented to urgent care on Thursday b/c he began to run temps and the bump "looked infected." He was sent to [facility] from urgent care for further treatment and evaluation. Pt states he had u/s and I&D was discharged and told to follow up with PCP in 48 hours. He reports it is not getting better, but it is not worse either, and states, "I decided to cut out the middle man and come here."

## 2012-01-13 ENCOUNTER — Observation Stay (HOSPITAL_COMMUNITY): Payer: BC Managed Care – PPO

## 2012-01-13 DIAGNOSIS — L02416 Cutaneous abscess of left lower limb: Secondary | ICD-10-CM | POA: Diagnosis present

## 2012-01-13 DIAGNOSIS — L03116 Cellulitis of left lower limb: Secondary | ICD-10-CM | POA: Diagnosis present

## 2012-01-13 LAB — CBC
Hemoglobin: 14.9 g/dL (ref 13.0–17.0)
MCHC: 33.4 g/dL (ref 30.0–36.0)
MCV: 90.7 fL (ref 78.0–100.0)
Platelets: 297 10*3/uL (ref 150–400)
RBC: 4.92 MIL/uL (ref 4.22–5.81)
RDW: 12.6 % (ref 11.5–15.5)

## 2012-01-13 LAB — BASIC METABOLIC PANEL
Chloride: 101 mEq/L (ref 96–112)
Creatinine, Ser: 1.1 mg/dL (ref 0.50–1.35)
GFR calc Af Amer: >90 mL/min (ref >90)
GFR calc non Af Amer: 81 mL/min — ABNORMAL LOW (ref >90)
Potassium: 3.8 mEq/L (ref 3.5–5.1)

## 2012-01-13 MED ORDER — CEFAZOLIN SODIUM-DEXTROSE 2-3 GM-% IV SOLR
2.0000 g | Freq: Three times a day (TID) | INTRAVENOUS | Status: DC
  Administered 2012-01-13 (×2): 2 g via INTRAVENOUS
  Filled 2012-01-13 (×3): qty 50

## 2012-01-13 MED ORDER — IOHEXOL 300 MG/ML  SOLN
100.0000 mL | Freq: Once | INTRAMUSCULAR | Status: AC | PRN
  Administered 2012-01-13: 100 mL via INTRAVENOUS

## 2012-01-13 MED ORDER — ACETAMINOPHEN 325 MG PO TABS
650.0000 mg | ORAL_TABLET | Freq: Four times a day (QID) | ORAL | Status: DC | PRN
  Administered 2012-01-13 – 2012-01-16 (×3): 650 mg via ORAL
  Filled 2012-01-13 (×3): qty 2

## 2012-01-13 MED ORDER — PIPERACILLIN-TAZOBACTAM 3.375 G IVPB
3.3750 g | Freq: Three times a day (TID) | INTRAVENOUS | Status: DC
  Administered 2012-01-13 – 2012-01-17 (×11): 3.375 g via INTRAVENOUS
  Filled 2012-01-13 (×14): qty 50

## 2012-01-13 MED ORDER — VANCOMYCIN HCL IN DEXTROSE 1-5 GM/200ML-% IV SOLN
1000.0000 mg | Freq: Two times a day (BID) | INTRAVENOUS | Status: DC
  Administered 2012-01-13 – 2012-01-16 (×7): 1000 mg via INTRAVENOUS
  Filled 2012-01-13 (×8): qty 200

## 2012-01-13 MED ORDER — VANCOMYCIN HCL 1000 MG IV SOLR
2000.0000 mg | Freq: Once | INTRAVENOUS | Status: AC
  Administered 2012-01-13: 2000 mg via INTRAVENOUS
  Filled 2012-01-13: qty 2000

## 2012-01-13 NOTE — Progress Notes (Signed)
[facility] PROGRESS NOTE  Kelly Johnston ZOX:[id number] DOB: Jan 14, 1971 DOA: 01/12/2012 PCP: No primary provider on file.  Brief narrative: 41 year old male with history of depression, IgA nephropathy who presented with left thigh cellulitis and abscess.  Assessment/Plan:  Principal Problem:  *Abscess and cellulitis of left thigh - patient spiked fever overnight even with antibiotics - concern is for possible development of bacteremia and/or sepsis - we will obtain blood cultures x 2 sets - we will get CT eft thigh to evaluate left thigh abscess - continue vancomycin but stop ancef - start zosyn per pharmacy - continue IV fluids - we may need surgery consult depending on CT left thigh results  Code Status: full code Family Communication: wife at bedside Disposition Plan: home when stable  Manson Passey, MD  [facility] Pager [phone]  If 7PM-7AM, please contact night-coverage [email or web address] Password [email or web address] 01/13/2012, 12:08 PM   LOS: 1 day   Consultants:  None   Procedures:  None   Antibiotics:  Ancef 11/16 --> 11/17  Vanco 01/12/2012 -->  Zosyn 01/13/2012 -->  HPI/Subjective: Pain in left leg, says he feels better but he did spike fever overnight.  Objective: Filed Vitals:   01/13/12 0337 01/13/12 0500 01/13/12 0930 01/13/12 1130  BP: 108/75  129/73   Pulse: 108  108   Temp: 100 F (37.8 C) 98.2 F (36.8 C) 98.1 F (36.7 C) 97.7 F (36.5 C)  TempSrc: Oral  Oral Oral  Resp: 15  16   Height:      Weight:      SpO2: 96%  97%     Intake/Output Summary (Last 24 hours) at 01/13/12 1208 Last data filed at 01/13/12 8295  Gross per 24 hour  Intake   1440 ml  Output      0 ml  Net   1440 ml    Exam:   General:  Pt is alert, follows commands appropriately, not in acute distress  Cardiovascular: Regular rate and rhythm, S1/S2, no murmurs, no rubs, no gallops  Respiratory: Clear to auscultation bilaterally, no wheezing, no crackles, no  rhonchi  Abdomen: Soft, non tender, non distended, bowel sounds present, no guarding  Extremities: left thigh cellulitis, worse than yesterday  Neuro: Grossly nonfocal  Data Reviewed: Basic Metabolic Panel:  Lab 01/13/12 6213 01/12/12 1115  NA 136 136  K 3.8 3.5  CL 101 102  CO2 25 24  GLUCOSE 118* 114*  BUN 13 14  CREATININE 1.10 1.16  CALCIUM 8.7 8.9   CBC:  Lab 01/13/12 0425 01/12/12 1115 01/10/12 1047  WBC 12.4* 13.3* 21.6*  HGB 14.9 15.0 16.7  HCT 44.6 44.2 54.8*  MCV 90.7 89.5 95.7  PLT 297 292 --    WOUND CULTURE     Status: Normal   Collection Time   01/10/12 11:35 AM      Component Value Range Status Comment   Culture Moderate STAPHYLOCOCCUS AUREUS   Final    GRAM STAIN Rare   Final    GRAM STAIN WBC   Final    GRAM STAIN No Squamous Epithelial Cells Seen   Final    GRAM STAIN Rare GRAM POSITIVE COCCI IN CLUSTERS   Final    Organism ID, Bacteria STAPHYLOCOCCUS AUREUS   Final      Studies: No results found.  Scheduled Meds:  . enoxaparin (LOVENOX)   60 mg Subcutaneous Q24H  . liothyronine  25 mcg Oral Daily  . mirtazapine  30 mg Oral QHS  .  piperacillin-tazobactam  3.375 g Intravenous Q8H  . vancomycin  1,000 mg Intravenous Q12H  . venlafaxine XR  150 mg Oral Daily  . verapamil  40 mg Oral TID

## 2012-01-13 NOTE — Progress Notes (Addendum)
ANTIBIOTIC CONSULT NOTE - INITIAL  Pharmacy Consult for Zosyn/Vancomcyin Indication: Cellulitis/MSSA/Concern for MRSA  No Known Allergies  Patient Measurements: Height: 6' 3.59" (192 cm) (Documented in chart as of 01/10/12) Weight: 259 lb 14.8 oz (117.9 kg) (Documented in chart on 01/10/12) IBW/kg (Calculated) : 85.86  Adjusted Body Weight:   Vital Signs: Temp: 98.2 F (36.8 C) (11/17 0500) Temp src: Oral (11/17 0337) BP: 108/75 mmHg (11/17 0337) Pulse Rate: 108  (11/17 0337) Intake/Output from previous day: 11/16 0701 - 11/17 0700 In: 1440 [P.O.:240; I.V.:1200] Out: -  Intake/Output from this shift:    Labs:  Basename 01/13/12 0425 01/12/12 1115 01/10/12 1047  WBC 12.4* 13.3* 21.6*  HGB 14.9 15.0 16.7  PLT 297 292 --  LABCREA -- -- --  CREATININE 1.10 1.16 --   Estimated Creatinine Clearance: 122.1 ml/min (by C-G formula based on Cr of 1.1). No results found for this basename: VANCOTROUGH:2,VANCOPEAK:2,VANCORANDOM:2,GENTTROUGH:2,GENTPEAK:2,GENTRANDOM:2,TOBRATROUGH:2,TOBRAPEAK:2,TOBRARND:2,AMIKACINPEAK:2,AMIKACINTROU:2,AMIKACIN:2, in the last 72 hours   Microbiology: Recent Results (from the past 720 hour(s))  WOUND CULTURE     Status: Normal   Collection Time   01/10/12 11:35 AM      Component Value Range Status Comment   Culture Moderate STAPHYLOCOCCUS AUREUS   Final    GRAM STAIN Rare   Final    GRAM STAIN WBC   Final    GRAM STAIN No Squamous Epithelial Cells Seen   Final    GRAM STAIN Rare GRAM POSITIVE COCCI IN CLUSTERS   Final    Organism ID, Bacteria STAPHYLOCOCCUS AUREUS   Final     Medical History: Past Medical History  Diagnosis Date  . Depression   . Chronic kidney disease   . Heart murmur     Medications:  11/17>> Ancef >>11/17 11/16>> Zosyn >> 11/16 11/16>> Vanc ordered, never administered 11/14>> Bactrim>>11/15    Assessment: 13 yoM with CKD/IgA nephropathy admitted for LLE cellulitis.  Pt had I/D 11/14, culture grew MSSA, and patient  treated with bactrim for 2 days. Pharmacy asked to manage Zosyn and Vancomycin therapy for patient due to concern for possible MRSA in addition to MSSA.    Tm24h: 100.9  WBC 13.3-->12.4  11/17 blood cultures sent.  11/14 I/D MSSA culture sensitive to penicillins.    SCr 1.10 - stable.  CrCl > 100 ml/min  Vancomcyin Trough Goal:  10-15 mcg/ml  Plan:  1.  Zosyn 3.375 grams IV q 8 hours (4 hour infusion) 2.  Vancomcyin  2 gm IV x 1 (Loading Dose) 2.  Vancomcyin 1 gm IV q 12 hours (Maintenance Dose) 2.  F/u cultures, T, WBC, renal fxn, clinical course, vancomycin trough at steady state  Debora Stockdale E 01/13/2012,8:27 AM

## 2012-01-14 DIAGNOSIS — L02419 Cutaneous abscess of limb, unspecified: Secondary | ICD-10-CM

## 2012-01-14 DIAGNOSIS — L03119 Cellulitis of unspecified part of limb: Secondary | ICD-10-CM

## 2012-01-14 MED ORDER — DOCUSATE SODIUM 100 MG PO CAPS
100.0000 mg | ORAL_CAPSULE | Freq: Every day | ORAL | Status: DC
  Administered 2012-01-14 – 2012-01-17 (×3): 100 mg via ORAL
  Filled 2012-01-14 (×4): qty 1

## 2012-01-14 MED ORDER — PSYLLIUM 95 % PO PACK
1.0000 | PACK | Freq: Every day | ORAL | Status: DC
  Administered 2012-01-14 – 2012-01-17 (×3): 1 via ORAL
  Filled 2012-01-14 (×4): qty 1

## 2012-01-14 NOTE — Consult Note (Signed)
Reason for Consult: Left thigh abscess/cellultis Referring Physician: Dr. [name] is an 41 y.o. male.  HPI: 41 yr old male who was admitted on Saturday due to left thigh abscess that has failed outpatient therapy.  Was seen by urgent care last week and had 2 incisions and drainage of this area with PO antibiotics.  Cultures showed MSSA.  When the area continued to get worse he represented and was admitted.  He has been on vanco and zosyn.  He had a CT yesterday and we were asked to see the patient for further recommendations.  Patient reports pain in the area and persistent redness that actually is improving.    Past Medical History  Diagnosis Date  . Depression   . Chronic kidney disease   . Heart murmur     Past Surgical History  Procedure Date  . Chest surgery   . Pectus excavatum repair     Family History  Problem Relation Age of Onset  . Testicular cancer Father     Social History:  reports that he has quit smoking. His smoking use included Cigarettes. He has a .2 pack-year smoking history. He has never used smokeless tobacco. He reports that he does not drink alcohol or use illicit drugs.  Allergies: No Known Allergies  Medications: I have reviewed the patient's current medications.  Results for orders placed during the hospital encounter of 01/12/12 (from the past 48 hour(s))  CBC     Status: Abnormal   Collection Time   01/12/12 11:15 AM      Component Value Range Comment   WBC 13.3 (*) 4.0 - 10.5 K/uL    RBC 4.94  4.22 - 5.81 MIL/uL    Hemoglobin 15.0  13.0 - 17.0 g/dL    HCT 62.1  30.8 - 65.7 %    MCV 89.5  78.0 - 100.0 fL    MCH 30.4  26.0 - 34.0 pg    MCHC 33.9  30.0 - 36.0 g/dL    RDW 84.6  96.2 - 95.2 %    Platelets 292  150 - 400 K/uL   BASIC METABOLIC PANEL     Status: Abnormal   Collection Time   01/12/12 11:15 AM      Component Value Range Comment   Sodium 136  135 - 145 mEq/L    Potassium 3.5  3.5 - 5.1 mEq/L    Chloride 102  96 -  112 mEq/L    CO2 24  19 - 32 mEq/L    Glucose, Bld 114 (*) 70 - 99 mg/dL    BUN 14  6 - 23 mg/dL    Creatinine, Ser 8.41  0.50 - 1.35 mg/dL    Calcium 8.9  8.4 - 32.4 mg/dL    GFR calc non Af Amer 76 (*) >90 mL/min    GFR calc Af Amer 88 (*) >90 mL/min   TSH     Status: Normal   Collection Time   01/13/12  4:25 AM      Component Value Range Comment   TSH 0.545  0.350 - 4.500 uIU/mL   CBC     Status: Abnormal   Collection Time   01/13/12  4:25 AM      Component Value Range Comment   WBC 12.4 (*) 4.0 - 10.5 K/uL    RBC 4.92  4.22 - 5.81 MIL/uL    Hemoglobin 14.9  13.0 - 17.0 g/dL    HCT 40.1  02.7 -  52.0 %    MCV 90.7  78.0 - 100.0 fL    MCH 30.3  26.0 - 34.0 pg    MCHC 33.4  30.0 - 36.0 g/dL    RDW 96.0  45.4 - 09.8 %    Platelets 297  150 - 400 K/uL   BASIC METABOLIC PANEL     Status: Abnormal   Collection Time   01/13/12  4:25 AM      Component Value Range Comment   Sodium 136  135 - 145 mEq/L    Potassium 3.8  3.5 - 5.1 mEq/L    Chloride 101  96 - 112 mEq/L    CO2 25  19 - 32 mEq/L    Glucose, Bld 118 (*) 70 - 99 mg/dL    BUN 13  6 - 23 mg/dL    Creatinine, Ser 1.19  0.50 - 1.35 mg/dL    Calcium 8.7  8.4 - 14.7 mg/dL    GFR calc non Af Amer 81 (*) >90 mL/min    GFR calc Af Amer >90  >90 mL/min   CULTURE, BLOOD (ROUTINE X 2)     Status: Normal (Preliminary result)   Collection Time   01/13/12  8:15 AM      Component Value Range Comment   Specimen Description BLOOD RIGHT HAND      Special Requests BOTTLES DRAWN AEROBIC AND ANAEROBIC 5CC EACH      Culture  Setup Time 01/13/2012 18:40      Culture        Value:        BLOOD CULTURE RECEIVED NO GROWTH TO DATE CULTURE WILL BE HELD FOR 5 DAYS BEFORE ISSUING A FINAL NEGATIVE REPORT   Report Status PENDING     CULTURE, BLOOD (ROUTINE X 2)     Status: Normal (Preliminary result)   Collection Time   01/13/12  8:22 AM      Component Value Range Comment   Specimen Description BLOOD RIGHT ARM      Special Requests BOTTLES  DRAWN AEROBIC AND ANAEROBIC 5CC EACH      Culture  Setup Time 01/13/2012 18:40      Culture        Value:        BLOOD CULTURE RECEIVED NO GROWTH TO DATE CULTURE WILL BE HELD FOR 5 DAYS BEFORE ISSUING A FINAL NEGATIVE REPORT   Report Status PENDING       Ct Femur Left W Contrast  01/13/2012  *RADIOLOGY REPORT*  Clinical Data: MRSA abscess.  Left thigh abscess.  CT OF THE LEFT FEMUR WITH CONTRAST  Contrast: OMNIPAQUE IOHEXOL 300 MG/ML  SOLN  Comparison: None.  Findings: Cellulitis of the anterior left thigh is present.  There is a wound extending to the skin surface anterior to the left sartorius.  This is subcutaneous without extension of the deep soft tissues.  There is high density material centrally, which may represent wound packing.  Bones appear within normal limits.  The neurovascular bundles are normal.  Reactive mildly enlarged lymph nodes present in the left inguinal region.  The distal left thigh appears normal.  There is lateral tilt of the patella incidentally noted.  IMPRESSION: Left thigh cellulitis with small wound in the anterior proximal thigh subcutaneous tissues.  No extension into the deep tissues. Mild reactive adenopathy.  No abscess.   Original Report Authenticated By: Kelly Johnston, M.D.     Review of Systems  Constitutional: Positive for fever. Negative for chills and  weight loss.  HENT: Negative.   Eyes: Negative.   Respiratory: Negative.   Cardiovascular: Negative.   Gastrointestinal: Negative.   Genitourinary: Negative.   Musculoskeletal: Negative.   Skin: Negative.        Abscess of left thigh  Neurological: Negative.   Endo/Heme/Allergies: Negative.   Psychiatric/Behavioral: Negative.    Blood pressure 149/90, pulse 94, temperature 97.9 F (36.6 C), temperature source Oral, resp. rate 15, height 6' 3.59" (1.92 m), weight 259 lb 14.8 oz (117.9 kg), SpO2 96.00%. Physical Exam  Constitutional: He is oriented to person, place, and time. He appears  well-developed and well-nourished. No distress.  HENT:  Head: Normocephalic and atraumatic.  Eyes: Conjunctivae normal are normal. Pupils are equal, round, and reactive to light.  Neck: Normal range of motion.  Cardiovascular: Normal rate and regular rhythm.   Respiratory: Effort normal and breath sounds normal.  GI: Soft. Bowel sounds are normal.  Genitourinary:       Deferred   Musculoskeletal: Normal range of motion.  Neurological: He is alert and oriented to person, place, and time.  Skin:       3-4 cm incision on anterior upper left thigh with large area of erythema that extends mostly superior.  The inferior portion seems to be resolving (has receeded from line that's drawn)  Wound is healthy appear with beefy red tissue  Psychiatric: He has a normal mood and affect. His behavior is normal.    Assessment/Plan: 1. Left thigh cellulitis with abscess that is s/p I and D: patient's wound appears to be adequately drained and CT does not show a residual abscess.  The patient did fail outpatient therapy and would recommend a full 72 hours of IV antibiotics followed by 10 days of PO (doxycycline).  Continue dressing changes daily with Iodoform dressing.  We will see the patient tomorrow before discharge.  Johnston, Kelly 01/14/2012, 10:43 AM    Agree with above.  Still with significant cellulitis, but no deep abscess.  Would like to see some improvement in cellulitis before switching to PO antibiotics.  Will follow.  Kelly Arms. Corliss Skains, MD, [facility]  01/14/2012 1:23 PM

## 2012-01-14 NOTE — Consult Note (Signed)
WOC consult Note Reason for Consult: Patient with suspected abscess Wound type:infectious Pressure Ulcer POA: No Patient seen by [facility] (PA) and there are orders for wet to dry dressings with iodoform gauze provided. I did not see and I will not follow.  Please re-consult if needed. Thanks, Ladona Mow, MSN, RN, [facility], CWOCN [phone])

## 2012-01-14 NOTE — Progress Notes (Signed)
Lt. Upper thigh extremely reddened with an open wound draining purulent drainage, a moderate amount. Dressing changed, area cleansed with NS and  Repacked with Idophor packing and covered with 4x4 an ABD. Pt.'s wife watched as I did the dressing change so she would know what to do when they go home. Possible discharge tommorrow. Appetite good. Family in visiting.Pt. Ambulating to the bathroom with wife's assist,tol. Well.

## 2012-01-14 NOTE — Progress Notes (Signed)
[facility] PROGRESS NOTE  Kelly Johnston RUE:[id number] DOB: Oct 25, 1970 DOA: 01/12/2012 PCP: No primary provider on file.  Brief narrative: 41 year old male with history of depression, IgA nephropathy who presented with left thigh cellulitis.  Assessment/Plan:   Principal Problem:  *Abscess and cellulitis of left thigh  - patient is now afebrile in past 24 hours  - CT left thigh shows no abscess - appreciate surgery recomednations - blood cultures x 2 sets show no growth to date - Continue vancomycin and Zosyn   Code Status: full code  Family Communication: wife at bedside  Disposition Plan: home when stable   Manson Passey, MD  [facility]  Pager [phone]  Consultants:  None  Procedures:  None  Antibiotics:  Ancef 11/16 --> 11/17  Vanco 01/12/2012 -->  Zosyn 01/13/2012 -->  If 7PM-7AM, please contact night-coverage [email or web address] Password [email or web address] 01/14/2012, 3:11 PM   LOS: 2 days   HPI/Subjective: No acute events overnight.  Objective: Filed Vitals:   01/14/12 0135 01/14/12 0535 01/14/12 1042 01/14/12 1350  BP: 126/77 149/90 149/90 129/87  Pulse: 79 94  89  Temp: 97.4 F (36.3 C) 97.9 F (36.6 C)  97.6 F (36.4 C)  TempSrc: Oral Oral  Oral  Resp: 16 15  18   Height:      Weight:      SpO2: 95% 96%  98%    Intake/Output Summary (Last 24 hours) at 01/14/12 1511 Last data filed at 01/14/12 0535  Gross per 24 hour  Intake   1142 ml  Output      0 ml  Net   1142 ml    Exam:   General:  Pt is alert, follows commands appropriately, not in acute distress  Cardiovascular: Regular rate and rhythm, S1/S2, no murmurs, no rubs, no gallops  Respiratory: Clear to auscultation bilaterally, no wheezing, no crackles, no rhonchi  Abdomen: Soft, non tender, non distended, bowel sounds present, no guarding  Extremities: Left thigh cellulitis with central wound area of about an inch in diameter draining pus, or pulses DP and PT palpable bilaterally  Neuro:  Grossly nonfocal  Data Reviewed: Basic Metabolic Panel:  Lab 01/13/12 6213 01/12/12 1115  NA 136 136  K 3.8 3.5  CL 101 102  CO2 25 24  GLUCOSE 118* 114*  BUN 13 14  CREATININE 1.10 1.16  CALCIUM 8.7 8.9   CBC:  Lab 01/13/12 0425 01/12/12 1115 01/10/12 1047  WBC 12.4* 13.3* 21.6*  HGB 14.9 15.0 16.7  HCT 44.6 44.2 54.8*  MCV 90.7 89.5 95.7  PLT 297 292 --     WOUND CULTURE     Status: Normal   Collection Time   01/10/12 11:35 AM      Component Value Range Status Comment   Organism ID, Bacteria STAPHYLOCOCCUS AUREUS   Final   CULTURE, BLOOD (ROUTINE X 2)     Status: Normal (Preliminary result)   Collection Time   01/13/12  8:15 AM      Component Value Range Status Comment   Culture     Final    Value:        BLOOD CULTURE RECEIVED NO GROWTH TO DATE   Report Status PENDING   Incomplete   CULTURE, BLOOD (ROUTINE X 2)     Status: Normal (Preliminary result)   Collection Time   01/13/12  8:22 AM      Component Value Range Status Comment   Culture     Final    Value:  BLOOD CULTURE RECEIVED NO GROWTH TO DATE    Report Status PENDING   Incomplete      Studies: Ct Femur Left W Contrast 01/13/2012  *  IMPRESSION: Left thigh cellulitis with small wound in the anterior proximal thigh subcutaneous tissues.  No extension into the deep tissues. Mild reactive adenopathy.  No abscess.      Scheduled Meds:  . docusate sodium  100 mg Oral Daily  . enoxaparin (LOVENOX)   60 mg Subcutaneous Q24H  . HYDROmorphone  1 mg Intravenous Once  . liothyronine  25 mcg Oral Daily  . mirtazapine  30 mg Oral QHS  . piperacillin-tazobactam   3.375 g Intravenous Q8H  . psyllium  1 packet Oral Daily  . vancomycin  1,000 mg Intravenous Q12H  . venlafaxine XR  150 mg Oral Daily  . verapamil  40 mg Oral TID

## 2012-01-15 ENCOUNTER — Encounter (HOSPITAL_COMMUNITY): Payer: Self-pay | Admitting: *Deleted

## 2012-01-15 NOTE — Progress Notes (Signed)
Patient ID: Kelly Johnston, male   DOB: 1970/03/24, 41 y.o.   MRN: [id number]    Subjective: Pt reports that thigh is feeling better, still having some fevers (low grade 100.4), feels feverish but feeling better overall.  Objective: Vital signs in last 24 hours: Temp:  [97.6 F (36.4 C)-100.3 F (37.9 C)] 98 F (36.7 C) (11/19 0529) Pulse Rate:  [80-97] 80  (11/19 0529) Resp:  [18-20] 20  (11/19 0529) BP: (129-149)/(83-90) 137/83 mmHg (11/19 0529) SpO2:  [98 %-100 %] 100 % (11/19 0529) Weight:  [259 lb 14.8 oz (117.901 kg)] 259 lb 14.8 oz (117.901 kg) (11/18 1350) Last BM Date: 01/11/12  Intake/Output from previous day: 11/18 0701 - 11/19 0700 In: 2377 [P.O.:1617; I.V.:560; IV Piggyback:200] Out: -  Intake/Output this shift:    PE: Thigh: decrease in overall redness, increase in purulent drainage from wound but it was not packed and had begun to close.    Lab Results:   Basename 01/13/12 0425 01/12/12 1115  WBC 12.4* 13.3*  HGB 14.9 15.0  HCT 44.6 44.2  PLT 297 292   BMET  Basename 01/13/12 0425 01/12/12 1115  NA 136 136  K 3.8 3.5  CL 101 102  CO2 25 24  GLUCOSE 118* 114*  BUN 13 14  CREATININE 1.10 1.16  CALCIUM 8.7 8.9   PT/INR No results found for this basename: LABPROT:2,INR:2 in the last 72 hours CMP     Component Value Date/Time   NA 136 01/13/2012 0425   K 3.8 01/13/2012 0425   CL 101 01/13/2012 0425   CO2 25 01/13/2012 0425   GLUCOSE 118* 01/13/2012 0425   BUN 13 01/13/2012 0425   CREATININE 1.10 01/13/2012 0425   CALCIUM 8.7 01/13/2012 0425   PROT 7.3 10/30/2009 2120   ALBUMIN 4.4 10/30/2009 2120   AST 33 10/30/2009 2120   ALT 22 10/30/2009 2120   ALKPHOS 64 10/30/2009 2120   BILITOT 1.1 10/30/2009 2120   GFRNONAA 81* 01/13/2012 0425   GFRAA >90 01/13/2012 0425   Lipase  No results found for this basename: lipase       Studies/Results: Ct Femur Left W Contrast  01/13/2012  *RADIOLOGY REPORT*  Clinical Data: MRSA abscess.  Left thigh abscess.   CT OF THE LEFT FEMUR WITH CONTRAST  Contrast: OMNIPAQUE IOHEXOL 300 MG/ML  SOLN  Comparison: None.  Findings: Cellulitis of the anterior left thigh is present.  There is a wound extending to the skin surface anterior to the left sartorius.  This is subcutaneous without extension of the deep soft tissues.  There is high density material centrally, which may represent wound packing.  Bones appear within normal limits.  The neurovascular bundles are normal.  Reactive mildly enlarged lymph nodes present in the left inguinal region.  The distal left thigh appears normal.  There is lateral tilt of the patella incidentally noted.  IMPRESSION: Left thigh cellulitis with small wound in the anterior proximal thigh subcutaneous tissues.  No extension into the deep tissues. Mild reactive adenopathy.  No abscess.   Original Report Authenticated By: Andreas Newport, M.D.     Anti-infectives: Anti-infectives     Start     Dose/Rate Route Frequency Ordered Stop   01/13/12 2200   vancomycin (VANCOCIN) IVPB 1000 mg/200 mL premix        1,000 mg 200 mL/hr over 60 Minutes Intravenous Every 12 hours 01/13/12 0940     01/13/12 1000  piperacillin-tazobactam (ZOSYN) IVPB 3.375 g  3.375 g 12.5 mL/hr over 240 Minutes Intravenous Every 8 hours 01/13/12 0919     01/13/12 1000   vancomycin (VANCOCIN) 2,000 mg in sodium chloride 0.9 % 500 mL IVPB        2,000 mg 250 mL/hr over 120 Minutes Intravenous  Once 01/13/12 0940 01/13/12 1300   01/13/12 0100   ceFAZolin (ANCEF) IVPB 2 g/50 mL premix  Status:  Discontinued        2 g 100 mL/hr over 30 Minutes Intravenous 3 times per day 01/13/12 0057 01/13/12 0754   01/12/12 1230  piperacillin-tazobactam (ZOSYN) IVPB 3.375 g       3.375 g 12.5 mL/hr over 240 Minutes Intravenous  Once 01/12/12 1202 01/12/12 1747   01/12/12 1230   vancomycin (VANCOCIN) IVPB 1000 mg/200 mL premix  Status:  Discontinued        1,000 mg 200 mL/hr over 60 Minutes Intravenous  Once  01/12/12 1202 01/13/12 0754           Assessment/Plan 1. Left Thigh abscess: erythema improving however increase in purulent drainage as wound was not packed.    --keep on IV abx today then convert to PO tomorrow.  --wound needs to be packed everyday with iodoform (wife watched dressing change today)  --OOB and ambulate in halls  --hopefully home tomorrow on PO abx and dressing changes.   LOS: 3 days    Makaela Cando 01/15/2012

## 2012-01-15 NOTE — Progress Notes (Signed)
[facility] PROGRESS NOTE  Kelly Johnston WUJ:[id number] DOB: [birth year] DOA: 01/12/2012 PCP: No primary provider on file.  Brief narrative: 41 year old male with history of depression, IgA nephropathy who presented with left thigh cellulitis and/or abscess.  Assessment/Plan:   Principal Problem:  *Abscess and cellulitis of left thigh   Patient spiked a low-grade fever in past 24 hour  Currently on Vancomycin and Zosyn   The patient had a CT scan of left thigh which did not reveal abscess  Appreciate surgery recommendation and dressing changes  Blood cultures x2 sets are negative to date  Active problems IgA nephropathy  Kidney function is normal  Per patient he is taking verapamil at home for IgA nephropathy Depression  Continue venlafaxine  Code Status: full code  Family Communication: wife at bedside updated on daily basis Disposition Plan: home when stable   Manson Passey, MD  [facility]  Pager [phone]   Consultants:  Surgery  Procedures:  None  Antibiotics:  Ancef 11/16 --> 11/17  Vanco 01/12/2012 -->  Zosyn 01/13/2012 -->  If 7PM-7AM, please contact night-coverage [email or web address] Password [email or web address] 01/15/2012, 12:42 PM   LOS: 3 days   HPI/Subjective: No acute overnight events.  Objective: Filed Vitals:   01/14/12 1350 01/14/12 1525 01/14/12 2206 01/15/12 0529  BP: 129/87 129/87 143/89 137/83  Pulse: 89  97 80  Temp: 97.6 F (36.4 C)  100.3 F (37.9 C) 98 F (36.7 C)  TempSrc: Oral  Oral Oral  Resp: 18  20 20   Height: 6' 3.59" (1.92 m)     Weight: 117.901 kg (259 lb 14.8 oz)     SpO2: 98%  98% 100%    Intake/Output Summary (Last 24 hours) at 01/15/12 1242 Last data filed at 01/15/12 0900  Gross per 24 hour  Intake   1757 ml  Output      0 ml  Net   1757 ml    Exam:   General:  Pt is alert, follows commands appropriately, not in acute distress  Cardiovascular: Regular rate and rhythm, S1/S2, no murmurs, no rubs, no  gallops  Respiratory: Clear to auscultation bilaterally, no wheezing, no crackles, no rhonchi  Abdomen: Soft, non tender, non distended, bowel sounds present, no guarding  Extremities: Left thigh cellulitis with central area of open wound with about 1 inch in length draining pus, pulses DP and PT palpable bilaterally  Neuro: Grossly nonfocal  Data Reviewed: Basic Metabolic Panel:  Lab 01/13/12 8657 01/12/12 1115  NA 136 136  K 3.8 3.5  CL 101 102  CO2 25 24  GLUCOSE 118* 114*  BUN 13 14  CREATININE 1.10 1.16  CALCIUM 8.7 8.9   CBC:  Lab 01/13/12 0425 01/12/12 1115 01/10/12 1047  WBC 12.4* 13.3* 21.6*  HGB 14.9 15.0 16.7  HCT 44.6 44.2 54.8*  MCV 90.7 89.5 95.7  PLT 297 292 --     WOUND CULTURE     Status: Normal   Collection Time   01/10/12 11:35 AM      Component Value Range Status Comment   GRAM STAIN Rare GRAM POSITIVE COCCI IN CLUSTERS   Final    Organism ID, Bacteria STAPHYLOCOCCUS AUREUS   Final   CULTURE, BLOOD (ROUTINE X 2)     Status: Normal (Preliminary result)   Collection Time   01/13/12  8:15 AM      Component Value Range Status Comment   Specimen Description BLOOD RIGHT HAND   Final    Special Requests BOTTLES DRAWN  AEROBIC AND ANAEROBIC 5CC EACH   Final    Culture  Setup Time 01/13/2012 18:40   Final    Culture     Final    Value:        BLOOD CULTURE RECEIVED NO GROWTH TO DATE CULTURE WILL BE HELD FOR 5 DAYS BEFORE ISSUING A FINAL NEGATIVE REPORT   Report Status PENDING   Incomplete   CULTURE, BLOOD (ROUTINE X 2)     Status: Normal (Preliminary result)   Collection Time   01/13/12  8:22 AM      Component Value Range Status Comment   Specimen Description BLOOD RIGHT ARM   Final    Special Requests BOTTLES DRAWN AEROBIC AND ANAEROBIC 5CC EACH   Final    Culture  Setup Time 01/13/2012 18:40   Final    Culture     Final    Value:        BLOOD CULTURE RECEIVED NO GROWTH TO DATE CULTURE WILL BE HELD FOR 5 DAYS BEFORE ISSUING A FINAL NEGATIVE REPORT    Report Status PENDING   Incomplete      Studies: Ct Femur Left W Contrast 01/13/2012  * IMPRESSION: Left thigh cellulitis with small wound in the anterior proximal thigh subcutaneous tissues.  No extension into the deep tissues. Mild reactive adenopathy.  No abscess.      Scheduled Meds:  . docusate sodium  100 mg Oral Daily  . enoxaparin (LOVENOX)   60 mg Subcutaneous Q24H  . HYDROmorphone  1 mg Intravenous Once  . liothyronine  25 mcg Oral Daily  . mirtazapine  30 mg Oral QHS  . piperacillin-tazobactam   3.375 g Intravenous Q8H  . psyllium  1 packet Oral Daily  . vancomycin  1,000 mg Intravenous Q12H  . venlafaxine XR  150 mg Oral Daily  . verapamil  40 mg Oral TID

## 2012-01-15 NOTE — Progress Notes (Signed)
Still with significant cellulitis, but it does not seem to be spreading. Purulent drainage from the wound No recent labs  Continue IV abx Will follow.  Wilmon Arms. Corliss Skains, MD, [facility]  01/15/2012 12:08 PM

## 2012-01-16 ENCOUNTER — Encounter (HOSPITAL_COMMUNITY): Payer: Self-pay | Admitting: *Deleted

## 2012-01-16 ENCOUNTER — Inpatient Hospital Stay (HOSPITAL_COMMUNITY): Payer: BC Managed Care – PPO | Admitting: *Deleted

## 2012-01-16 ENCOUNTER — Encounter (HOSPITAL_COMMUNITY)
Admission: EM | Disposition: A | Discharge status: Home / Self Care | Payer: Self-pay | Source: Home / Self Care | Attending: Internal Medicine

## 2012-01-16 DIAGNOSIS — F329 Major depressive disorder, single episode, unspecified: Secondary | ICD-10-CM

## 2012-01-16 DIAGNOSIS — F32A Depression, unspecified: Secondary | ICD-10-CM | POA: Diagnosis present

## 2012-01-16 HISTORY — PX: INCISION AND DRAINAGE ABSCESS: SHX5864

## 2012-01-16 LAB — BASIC METABOLIC PANEL
BUN: 13 mg/dL (ref 6–23)
CO2: 28 mEq/L (ref 19–32)
Chloride: 104 mEq/L (ref 96–112)
Creatinine, Ser: 1.09 mg/dL (ref 0.50–1.35)

## 2012-01-16 LAB — CBC
HCT: 45 % (ref 39.0–52.0)
MCV: 91.6 fL (ref 78.0–100.0)
RBC: 4.91 MIL/uL (ref 4.22–5.81)
WBC: 9.2 10*3/uL (ref 4.0–10.5)

## 2012-01-16 SURGERY — INCISION AND DRAINAGE, ABSCESS
Anesthesia: General | Site: Leg Upper | Laterality: Left | Wound class: Dirty or Infected

## 2012-01-16 MED ORDER — ONDANSETRON HCL 4 MG/2ML IJ SOLN
INTRAMUSCULAR | Status: DC | PRN
  Administered 2012-01-16: 4 mg via INTRAVENOUS

## 2012-01-16 MED ORDER — BUPIVACAINE-EPINEPHRINE PF 0.25-1:200000 % IJ SOLN
INTRAMUSCULAR | Status: AC
  Filled 2012-01-16: qty 30

## 2012-01-16 MED ORDER — FENTANYL CITRATE 0.05 MG/ML IJ SOLN
25.0000 ug | INTRAMUSCULAR | Status: DC | PRN
  Administered 2012-01-16 (×2): 25 ug via INTRAVENOUS

## 2012-01-16 MED ORDER — FENTANYL CITRATE 0.05 MG/ML IJ SOLN
INTRAMUSCULAR | Status: DC | PRN
  Administered 2012-01-16: 50 ug via INTRAVENOUS
  Administered 2012-01-16: 100 ug via INTRAVENOUS

## 2012-01-16 MED ORDER — HYDROMORPHONE HCL PF 1 MG/ML IJ SOLN: 1.0000 mg | INTRAMUSCULAR | Status: DC | PRN

## 2012-01-16 MED ORDER — 0.9 % SODIUM CHLORIDE (POUR BTL) OPTIME
TOPICAL | Status: DC | PRN
  Administered 2012-01-16: 1000 mL

## 2012-01-16 MED ORDER — ENOXAPARIN SODIUM 60 MG/0.6ML ~~LOC~~ SOLN
60.0000 mg | SUBCUTANEOUS | Status: DC
  Administered 2012-01-17: 60 mg via SUBCUTANEOUS
  Filled 2012-01-16 (×2): qty 0.6

## 2012-01-16 MED ORDER — BUPIVACAINE-EPINEPHRINE 0.25% -1:200000 IJ SOLN
INTRAMUSCULAR | Status: DC | PRN
  Administered 2012-01-16: 10 mL

## 2012-01-16 MED ORDER — LIDOCAINE-EPINEPHRINE 1 %-1:100000 IJ SOLN
30.0000 mL | Freq: Once | INTRAMUSCULAR | Status: DC
  Filled 2012-01-16: qty 30

## 2012-01-16 MED ORDER — OXYCODONE-ACETAMINOPHEN 5-325 MG PO TABS
1.0000 | ORAL_TABLET | ORAL | Status: DC | PRN
  Administered 2012-01-17: 2 via ORAL
  Filled 2012-01-16: qty 2

## 2012-01-16 MED ORDER — ESMOLOL HCL 10 MG/ML IV SOLN
INTRAVENOUS | Status: DC | PRN
  Administered 2012-01-16: 10 mg via INTRAVENOUS
  Administered 2012-01-16 (×2): 20 mg via INTRAVENOUS

## 2012-01-16 MED ORDER — FENTANYL CITRATE 0.05 MG/ML IJ SOLN
INTRAMUSCULAR | Status: AC
  Filled 2012-01-16: qty 2

## 2012-01-16 MED ORDER — ONDANSETRON HCL 4 MG/2ML IJ SOLN: 4.0000 mg | Freq: Four times a day (QID) | INTRAMUSCULAR | Status: DC | PRN

## 2012-01-16 MED ORDER — KETOROLAC TROMETHAMINE 15 MG/ML IJ SOLN
15.0000 mg | Freq: Four times a day (QID) | INTRAMUSCULAR | Status: DC
  Administered 2012-01-16 – 2012-01-17 (×3): 15 mg via INTRAVENOUS
  Filled 2012-01-16 (×7): qty 1

## 2012-01-16 MED ORDER — ONDANSETRON HCL 4 MG PO TABS: 4.0000 mg | ORAL_TABLET | Freq: Four times a day (QID) | ORAL | Status: DC | PRN

## 2012-01-16 MED ORDER — HYDRALAZINE HCL 20 MG/ML IJ SOLN
INTRAMUSCULAR | Status: AC
  Filled 2012-01-16: qty 1

## 2012-01-16 MED ORDER — LIDOCAINE HCL 1 % IJ SOLN
INTRAMUSCULAR | Status: DC | PRN
  Administered 2012-01-16: 100 mg via INTRADERMAL

## 2012-01-16 MED ORDER — PROPOFOL 10 MG/ML IV BOLUS
INTRAVENOUS | Status: DC | PRN
  Administered 2012-01-16: 200 mg via INTRAVENOUS

## 2012-01-16 MED ORDER — ENOXAPARIN SODIUM 40 MG/0.4ML ~~LOC~~ SOLN: 40.0000 mg | SUBCUTANEOUS | Status: DC

## 2012-01-16 MED ORDER — SODIUM CHLORIDE 0.9 % IR SOLN
Status: DC | PRN
  Administered 2012-01-16: 3000 mL

## 2012-01-16 MED ORDER — MIDAZOLAM HCL 5 MG/5ML IJ SOLN
INTRAMUSCULAR | Status: DC | PRN
  Administered 2012-01-16: 2 mg via INTRAVENOUS

## 2012-01-16 MED ORDER — HYDRALAZINE HCL 20 MG/ML IJ SOLN
INTRAMUSCULAR | Status: DC | PRN
  Administered 2012-01-16: 5 mg via INTRAVENOUS

## 2012-01-16 MED ORDER — VANCOMYCIN HCL 1000 MG IV SOLR
1250.0000 mg | Freq: Two times a day (BID) | INTRAVENOUS | Status: DC
  Administered 2012-01-17: 1250 mg via INTRAVENOUS
  Filled 2012-01-16 (×2): qty 1250

## 2012-01-16 MED ORDER — KCL IN DEXTROSE-NACL 20-5-0.45 MEQ/L-%-% IV SOLN
INTRAVENOUS | Status: DC
  Administered 2012-01-16 – 2012-01-17 (×2): via INTRAVENOUS
  Filled 2012-01-16 (×4): qty 1000

## 2012-01-16 MED ORDER — LACTATED RINGERS IV SOLN: INTRAVENOUS | Status: DC

## 2012-01-16 SURGICAL SUPPLY — 33 items
BANDAGE GAUZE ELAST BULKY 4 IN (GAUZE/BANDAGES/DRESSINGS) ×2 IMPLANT
BLADE SURG 15 STRL LF DISP TIS (BLADE) ×1 IMPLANT
BLADE SURG 15 STRL SS (BLADE) ×1
CANISTER SUCTION 2500CC (MISCELLANEOUS) ×2 IMPLANT
CLOTH BEACON ORANGE TIMEOUT ST (SAFETY) ×2 IMPLANT
COVER SURGICAL LIGHT HANDLE (MISCELLANEOUS) ×2 IMPLANT
DECANTER SPIKE VIAL GLASS SM (MISCELLANEOUS) ×2 IMPLANT
DRAPE LAPAROSCOPIC ABDOMINAL (DRAPES) IMPLANT
DRSG PAD ABDOMINAL 8X10 ST (GAUZE/BANDAGES/DRESSINGS) ×2 IMPLANT
ELECT CAUTERY BLADE 6.4 (BLADE) ×2 IMPLANT
ELECT REM PT RETURN 9FT ADLT (ELECTROSURGICAL) ×2
ELECTRODE REM PT RTRN 9FT ADLT (ELECTROSURGICAL) ×1 IMPLANT
GLOVE BIO SURGEON STRL SZ7.5 (GLOVE) ×2 IMPLANT
GOWN STRL NON-REIN LRG LVL3 (GOWN DISPOSABLE) ×4 IMPLANT
HANDPIECE INTERPULSE COAX TIP (DISPOSABLE) ×1
KIT BASIN OR (CUSTOM PROCEDURE TRAY) ×2 IMPLANT
NEEDLE HYPO 25X1 1.5 SAFETY (NEEDLE) IMPLANT
NS IRRIG 1000ML POUR BTL (IV SOLUTION) ×2 IMPLANT
PENCIL BUTTON HOLSTER BLD 10FT (ELECTRODE) ×2 IMPLANT
SET HNDPC FAN SPRY TIP SCT (DISPOSABLE) ×1 IMPLANT
SPONGE GAUZE 4X4 12PLY (GAUZE/BANDAGES/DRESSINGS) IMPLANT
SPONGE LAP 18X18 X RAY DECT (DISPOSABLE) ×2 IMPLANT
SUT MNCRL AB 4-0 PS2 18 (SUTURE) IMPLANT
SUT VIC AB 3-0 SH 27 (SUTURE)
SUT VIC AB 3-0 SH 27XBRD (SUTURE) IMPLANT
SWAB COLLECTION DEVICE MRSA (MISCELLANEOUS) IMPLANT
SYR BULB 3OZ (MISCELLANEOUS) ×2 IMPLANT
SYR CONTROL 10ML LL (SYRINGE) IMPLANT
TAPE CLOTH SURG 4X10 WHT LF (GAUZE/BANDAGES/DRESSINGS) ×2 IMPLANT
TOWEL OR 17X26 10 PK STRL BLUE (TOWEL DISPOSABLE) ×2 IMPLANT
TUBE ANAEROBIC SPECIMEN COL (MISCELLANEOUS) IMPLANT
WATER STERILE IRR 1000ML POUR (IV SOLUTION) IMPLANT
YANKAUER SUCT BULB TIP NO VENT (SUCTIONS) ×2 IMPLANT

## 2012-01-16 NOTE — Transfer of Care (Signed)
Immediate Anesthesia Transfer of Care Note  Patient: Kelly Johnston  Procedure(s) Performed: Procedure(s) (LRB) with comments: INCISION AND DRAINAGE ABSCESS (Left)  Patient Location: PACU  Anesthesia Type:General  Level of Consciousness: awake, alert , oriented and patient cooperative  Airway & Oxygen Therapy: Patient Spontanous Breathing and Patient connected to face mask oxygen  Post-op Assessment: Report given to PACU RN, Post -op Vital signs reviewed and stable and Patient moving all extremities  Post vital signs: Reviewed and stable  Complications: No apparent anesthesia complications

## 2012-01-16 NOTE — Anesthesia Procedure Notes (Signed)
Procedure Name: LMA Insertion Date/Time: 01/16/2012 12:41 PM Performed by: Durward Parcel A Pre-anesthesia Checklist: Patient identified, Timeout performed, Emergency Drugs available, Suction available and Patient being monitored Patient Re-evaluated:Patient Re-evaluated prior to inductionOxygen Delivery Method: Circle system utilized Preoxygenation: Pre-oxygenation with 100% oxygen Intubation Type: IV induction Ventilation: Mask ventilation without difficulty LMA: LMA with gastric port inserted LMA Size: 5.0 Number of attempts: 1 Tube secured with: Tape

## 2012-01-16 NOTE — Op Note (Signed)
Pre-op Diagnosis:  Left thigh abscess Post-op Diagnosis:  Same Procedure:  Incision and debridement left thigh Surgeon:  Manus Rudd K. Anesthesia:  Gen - LMA Indications:  This is a 41 year old male who presents with a one-week history of a left thigh abscess. This was drained at urgent care and treated with by mouth antibiotics. The cellulitis seems to have worsened. This morning there is a fluctuant area above his previous incision. We made the decision to proceed to the operating room for formal debridement.  The patient was brought to the operating room and placed in a supine position on the operating room table.After an adequate level of general anesthesia was obtained the patient's left proximal thigh and groin were shaved prepped with Betadine and draped sterile fashion. A timeout was taken to ensure the proper patient and proper procedure. We infiltrated the area around the incision with quarter percent Marcaine with epinephrine.  I explored the wound and there was 2 separate pockets of purulent fluid that were trapped in the subcutaneous fat superiorly. We excised some of the overlying skin and subcutaneous tissues. The abscess does not seem to track into the muscle. We thoroughly explored the wound and drained and he trapped pockets of infection. We then used the pulse lavage device with 2 L of normal saline to thoroughly irrigated the wound. We cauterized thoroughly for hemostasis. The wound was then tightly packed with saline soaked gauze. A dry dressing was applied. The patient was then extubated and brought to the recovery room in stable condition. All sponge, instrument, and needle counts are correct.  Wilmon Arms. Corliss Skains, MD, [facility]  01/16/2012 1:16 PM

## 2012-01-16 NOTE — Progress Notes (Signed)
ANTIBIOTIC CONSULT NOTE - Follow-up Pharmacy Consult for Zosyn/Vancomcyin Indication: Cellulitis/MSSA/Concern for MRSA  No Known Allergies  Patient Measurements: Height: 6' 3.59" (192 cm) Weight: 259 lb 14.8 oz (117.901 kg) IBW/kg (Calculated) : 85.86  Adjusted Body Weight:   Vital Signs: Temp: 98 F (36.7 C) (11/20 2206) Temp src: Oral (11/20 2206) BP: 151/84 mmHg (11/20 2206) Pulse Rate: 83  (11/20 2206) Intake/Output from previous day: 11/19 0701 - 11/20 0700 In: 1810 [P.O.:960; IV Piggyback:850] Out: -  Intake/Output from this shift: Total I/O In: 120 [P.O.:120] Out: -   Labs:  Basename 01/16/12 1006  WBC 9.2  HGB 14.8  PLT 373  LABCREA --  CREATININE 1.09   Estimated Creatinine Clearance: 123.2 ml/min (by C-G formula based on Cr of 1.09).  Basename 01/16/12 2131  VANCOTROUGH 8.1*  VANCOPEAK --  Drue Dun --  GENTTROUGH --  GENTPEAK --  GENTRANDOM --  TOBRATROUGH --  TOBRAPEAK --  TOBRARND --  AMIKACINPEAK --  AMIKACINTROU --  AMIKACIN --     Microbiology: Recent Results (from the past 720 hour(s))  WOUND CULTURE     Status: Normal   Collection Time   01/10/12 11:35 AM      Component Value Range Status Comment   Culture Moderate STAPHYLOCOCCUS AUREUS   Final    GRAM STAIN Rare   Final    GRAM STAIN WBC   Final    GRAM STAIN No Squamous Epithelial Cells Seen   Final    GRAM STAIN Rare GRAM POSITIVE COCCI IN CLUSTERS   Final    Organism ID, Bacteria STAPHYLOCOCCUS AUREUS   Final   CULTURE, BLOOD (ROUTINE X 2)     Status: Normal (Preliminary result)   Collection Time   01/13/12  8:15 AM      Component Value Range Status Comment   Specimen Description BLOOD RIGHT HAND   Final    Special Requests BOTTLES DRAWN AEROBIC AND ANAEROBIC 5CC EACH   Final    Culture  Setup Time 01/13/2012 18:40   Final    Culture     Final    Value:        BLOOD CULTURE RECEIVED NO GROWTH TO DATE CULTURE WILL BE HELD FOR 5 DAYS BEFORE ISSUING A FINAL NEGATIVE REPORT     Report Status PENDING   Incomplete   CULTURE, BLOOD (ROUTINE X 2)     Status: Normal (Preliminary result)   Collection Time   01/13/12  8:22 AM      Component Value Range Status Comment   Specimen Description BLOOD RIGHT ARM   Final    Special Requests BOTTLES DRAWN AEROBIC AND ANAEROBIC 5CC EACH   Final    Culture  Setup Time 01/13/2012 18:40   Final    Culture     Final    Value:        BLOOD CULTURE RECEIVED NO GROWTH TO DATE CULTURE WILL BE HELD FOR 5 DAYS BEFORE ISSUING A FINAL NEGATIVE REPORT   Report Status PENDING   Incomplete     Medical History: Past Medical History  Diagnosis Date  . Depression   . Chronic kidney disease   . Heart murmur     Medications:  11/17>> Ancef >>11/17 11/16>> Zosyn >> 11/16 11/16>> Vanc ordered, never administered 11/14>> Bactrim>>11/15  11/17 >> vanc >> 11/17 >> zosyn >>   Assessment: 61 yoM with CKD/IgA nephropathy admitted for LLE cellulitis.  Pt had I/D 11/14, culture grew MSSA, and patient treated with bactrim for  2 days. Pharmacy asked to manage Zosyn and Vancomycin therapy for patient due to concern for possible MRSA in addition to MSSA.    afebrile  WBC 13.3-->12.4 >> now wnl   11/17 blood cultures show no growth to date  11/14 I/D MSSA culture sensitive to penicillins.    SCr 1.09 - stable.  CrCl > 100 ml/min  Per MD - wound not improving. Patient with repeat I&D today 11/20  VT = 8.1 - below goal.  Vancomcyin Trough Goal:  10-15 mcg/ml  Plan:  1.  Continue Zosyn 3.375 grams IV q 8 hours (4 hour infusion) 2. Increase Vancomycin to 1250mg  IV q12h.   3. F/U SCr/levels as needed.  Lorenza Evangelist 01/16/2012 11:20 PM

## 2012-01-16 NOTE — Progress Notes (Signed)
Nutrition Brief Note  Patient identified on the Malnutrition Screening Tool (MST) Report  Body mass index is 31.98 kg/(m^2). Pt meets criteria for class I obesity based on current BMI.   Current diet order is clear liquid. Labs and medications reviewed. Met with pt and wife who report pt eating excellent with stable weight PTA. Diet will likely be advanced soon, as tolerated per nursing.   No nutrition interventions warranted at this time. If nutrition issues arise, please consult RD.   Levon Hedger MS, RD, LDN [phone] Pager [phone] After Hours Pager

## 2012-01-16 NOTE — Progress Notes (Signed)
ANTIBIOTIC CONSULT NOTE - Follow-up Pharmacy Consult for Zosyn/Vancomcyin Indication: Cellulitis/MSSA/Concern for MRSA  No Known Allergies  Patient Measurements: Height: 6' 3.59" (192 cm) Weight: 259 lb 14.8 oz (117.901 kg) IBW/kg (Calculated) : 85.86  Adjusted Body Weight:   Vital Signs: Temp: 97.7 F (36.5 C) (11/20 1433) Temp src: Oral (11/20 0628) BP: 144/89 mmHg (11/20 1433) Pulse Rate: 88  (11/20 1433) Intake/Output from previous day: 11/19 0701 - 11/20 0700 In: 1810 [P.O.:960; IV Piggyback:850] Out: -  Intake/Output from this shift: Total I/O In: 200 [I.V.:200] Out: 920 [Urine:900; Blood:20]  Labs:  [facility] 01/16/12 1006  WBC 9.2  HGB 14.8  PLT 373  LABCREA --  CREATININE 1.09   Estimated Creatinine Clearance: 123.2 ml/min (by C-G formula based on Cr of 1.09). No results found for this basename: VANCOTROUGH:2,VANCOPEAK:2,VANCORANDOM:2,GENTTROUGH:2,GENTPEAK:2,GENTRANDOM:2,TOBRATROUGH:2,TOBRAPEAK:2,TOBRARND:2,AMIKACINPEAK:2,AMIKACINTROU:2,AMIKACIN:2, in the last 72 hours   Microbiology: Recent Results (from the past 720 hour(s))  WOUND CULTURE     Status: Normal   Collection Time   01/10/12 11:35 AM      Component Value Range Status Comment   Culture Moderate STAPHYLOCOCCUS AUREUS   Final    GRAM STAIN Rare   Final    GRAM STAIN WBC   Final    GRAM STAIN No Squamous Epithelial Cells Seen   Final    GRAM STAIN Rare GRAM POSITIVE COCCI IN CLUSTERS   Final    Organism ID, Bacteria STAPHYLOCOCCUS AUREUS   Final   CULTURE, BLOOD (ROUTINE X 2)     Status: Normal (Preliminary result)   Collection Time   01/13/12  8:15 AM      Component Value Range Status Comment   Specimen Description BLOOD RIGHT HAND   Final    Special Requests BOTTLES DRAWN AEROBIC AND ANAEROBIC 5CC EACH   Final    Culture  Setup Time 01/13/2012 18:40   Final    Culture     Final    Value:        BLOOD CULTURE RECEIVED NO GROWTH TO DATE CULTURE WILL BE HELD FOR 5 DAYS BEFORE ISSUING A FINAL  NEGATIVE REPORT   Report Status PENDING   Incomplete   CULTURE, BLOOD (ROUTINE X 2)     Status: Normal (Preliminary result)   Collection Time   01/13/12  8:22 AM      Component Value Range Status Comment   Specimen Description BLOOD RIGHT ARM   Final    Special Requests BOTTLES DRAWN AEROBIC AND ANAEROBIC 5CC EACH   Final    Culture  Setup Time 01/13/2012 18:40   Final    Culture     Final    Value:        BLOOD CULTURE RECEIVED NO GROWTH TO DATE CULTURE WILL BE HELD FOR 5 DAYS BEFORE ISSUING A FINAL NEGATIVE REPORT   Report Status PENDING   Incomplete     Medical History: Past Medical History  Diagnosis Date  . Depression   . Chronic kidney disease   . Heart murmur     Medications:  11/17>> Ancef >>11/17 11/16>> Zosyn >> 11/16 11/16>> Vanc ordered, never administered 11/14>> Bactrim>>11/15  11/17 >> vanc >> 11/17 >> zosyn >>   Assessment: 84 yoM with CKD/IgA nephropathy admitted for LLE cellulitis.  Pt had I/D 11/14, culture grew MSSA, and patient treated with bactrim for 2 days. Pharmacy asked to manage Zosyn and Vancomycin therapy for patient due to concern for possible MRSA in addition to MSSA.    afebrile  WBC 13.3-->12.4 >>  now wnl   11/17 blood cultures show no growth to date  11/14 I/D MSSA culture sensitive to penicillins.    SCr 1.09 - stable.  CrCl > 100 ml/min  Per MD - wound not improving. Patient with repeat I&D today 11/20  Vancomcyin Trough Goal:  10-15 mcg/ml  Plan:  1.  Continue Zosyn 3.375 grams IV q 8 hours (4 hour infusion) 2. Continue Vancomcyin 1 gm IV q 12 hours (Maintenance Dose) for now 3. Will obtain vancomycin steady state trough prior to 2200 dose tonight and adjust dose accordingly 4.  F/u cultures, T, WBC, renal fxn, clinical course, vancomycin trough at steady state  [employer], Pharm.D. Clinical Oncology Pharmacist  Pager # [phone] 01/16/2012,3:18 PM

## 2012-01-16 NOTE — Progress Notes (Signed)
  Subjective: Increasing tenderness in proximal thigh  Objective: Vital signs in last 24 hours: Temp:  [97.6 F (36.4 C)-98.3 F (36.8 C)] 97.6 F (36.4 C) (11/20 0628) Pulse Rate:  [81-107] 81  (11/20 0628) Resp:  [16-20] 16  (11/20 0628) BP: (137-142)/(79-88) 139/79 mmHg (11/20 0628) SpO2:  [96 %-98 %] 98 % (11/20 0628) Last BM Date: 01/12/12  Intake/Output from previous day: 11/19 0701 - 11/20 0700 In: 1810 [P.O.:960; IV Piggyback:850] Out: -  Intake/Output this shift:    Increased fluctuance and deep erythema above the wound  Lab Results:  No results found for this basename: WBC:2,HGB:2,HCT:2,PLT:2 in the last 72 hours BMET No results found for this basename: NA:2,K:2,CL:2,CO2:2,GLUCOSE:2,BUN:2,CREATININE:2,CALCIUM:2 in the last 72 hours PT/INR No results found for this basename: LABPROT:2,INR:2 in the last 72 hours ABG No results found for this basename: PHART:2,PCO2:2,PO2:2,HCO3:2 in the last 72 hours  Studies/Results: No results found.  Anti-infectives: Anti-infectives     Start     Dose/Rate Route Frequency Ordered Stop   01/13/12 2200   vancomycin (VANCOCIN) IVPB 1000 mg/200 mL premix        1,000 mg 200 mL/hr over 60 Minutes Intravenous Every 12 hours 01/13/12 0940     01/13/12 1000   piperacillin-tazobactam (ZOSYN) IVPB 3.375 g        3.375 g 12.5 mL/hr over 240 Minutes Intravenous Every 8 hours 01/13/12 0919     01/13/12 1000   vancomycin (VANCOCIN) 2,000 mg in sodium chloride 0.9 % 500 mL IVPB        2,000 mg 250 mL/hr over 120 Minutes Intravenous  Once 01/13/12 0940 01/13/12 1300   01/13/12 0100   ceFAZolin (ANCEF) IVPB 2 g/50 mL premix  Status:  Discontinued        2 g 100 mL/hr over 30 Minutes Intravenous 3 times per day 01/13/12 0057 01/13/12 0754   01/12/12 1230   piperacillin-tazobactam (ZOSYN) IVPB 3.375 g        3.375 g 12.5 mL/hr over 240 Minutes Intravenous  Once 01/12/12 1202 01/12/12 1747   01/12/12 1230   vancomycin (VANCOCIN)  IVPB 1000 mg/200 mL premix  Status:  Discontinued        1,000 mg 200 mL/hr over 60 Minutes Intravenous  Once 01/12/12 1202 01/13/12 0754          Assessment/Plan: s/p * No surgery found * Worsening cellulitis/ abscess Plan wound exploration/ incision and drainage of left thigh abscess today.  Transfer to [facility] - we will call [facility] if needed, but the patient has minimal other comorbidities.  The surgical procedure has been discussed with the patient.  Potential risks, benefits, alternative treatments, and expected outcomes have been explained.  All of the patient's questions at this time have been answered.  The likelihood of reaching the patient's treatment goal is good.  The patient understand the proposed surgical procedure and wishes to proceed.   LOS: 4 days    Aliya Sol K. 01/16/2012

## 2012-01-16 NOTE — Progress Notes (Signed)
[facility] PROGRESS NOTE  Kelly Johnston MVH:[id number] DOB: 1971/01/13 DOA: 01/12/2012  PCP: Does not have a PCP Nephrologist: Dr. Abel Presto  Brief HPI: Kelly Johnston is a 41 y.o. 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 He thought it was a pimple vs ingrown hair on last Monday, but it spread to the surrounding area with cellulitic picture, went to urgent care on Thursday and underwent an I& D done, growing MSSA. He was on bactrim the last 2 days, but was asked to come to ED as the area was not improving. He reported subjective fever, pain in the area. No other complaints.   Past medical history:  Past Medical History  Diagnosis Date  . Depression   . Chronic kidney disease   . Heart murmur     Consultants: Gen Surgery  Procedures: Plan is for surgical debridement of left thigh wound 11/20  Antibiotics: Vanc and Zosyn since 11/17  Subjective: Patient feels some pain in the left thigh. Better than what it was. Redness persists. Some yellowish drainage.  Objective: Vital Signs  Filed Vitals:   01/15/12 0529 01/15/12 1342 01/15/12 2218 01/16/12 0628  BP: 137/83 142/80 137/88 139/79  Pulse: 80 107 86 81  Temp: 98 F (36.7 C) 98.3 F (36.8 C) 97.8 F (36.6 C) 97.6 F (36.4 C)  TempSrc: Oral  Oral Oral  Resp: 20 18 20 16   Height:      Weight:      SpO2: 100% 98% 96% 98%    Intake/Output Summary (Last 24 hours) at 01/16/12 0859 Last data filed at 01/16/12 0600  Gross per 24 hour  Intake   1810 ml  Output      0 ml  Net   1810 ml   Filed Weights   01/12/12 1507 01/14/12 1350  Weight: 117.9 kg (259 lb 14.8 oz) 117.901 kg (259 lb 14.8 oz)    Intake/Output from previous day: 11/19 0701 - 11/20 0700 In: 1810 [P.O.:960; IV Piggyback:850] Out: -   General appearance: alert, cooperative, appears stated age and no distress Head: Normocephalic, without obvious abnormality, atraumatic Resp: clear to  auscultation bilaterally Cardio: regular rate and rhythm, S1, S2 normal, no murmur, click, rub or gallop GI: soft, non-tender; bowel sounds normal; no masses,  no organomegaly Extremities: Left thigh shows erythema, swelling, tenderness, open wound with yellow drainage Pulses: 2+ and symmetric Skin: erythema around left thigh Neurologic: Alert and Oriented x 3. No focal deficits.  Lab Results:  Basic Metabolic Panel:  Lab 01/13/12 8413 01/12/12 1115  NA 136 136  K 3.8 3.5  CL 101 102  CO2 25 24  GLUCOSE 118* 114*  BUN 13 14  CREATININE 1.10 1.16  CALCIUM 8.7 8.9  MG -- --  PHOS -- --   Liver Function Tests: No results found for this basename: AST:5,ALT:5,ALKPHOS:5,BILITOT:5,PROT:5,ALBUMIN:5 in the last 168 hours No results found for this basename: LIPASE:5,AMYLASE:5 in the last 168 hours No results found for this basename: AMMONIA:5 in the last 168 hours CBC:  Lab 01/13/12 0425 01/12/12 1115 01/10/12 1047  WBC 12.4* 13.3* 21.6*  NEUTROABS -- -- --  HGB 14.9 15.0 16.7  HCT 44.6 44.2 54.8*  MCV 90.7 89.5 95.7  PLT 297 292 --   Cardiac Enzymes: No results found for this basename: CKTOTAL:5,CKMB:5,CKMBINDEX:5,TROPONINI:5 in the last 168 hours BNP (last 3 results) No results found for this basename: PROBNP:3 in the last 8760 hours CBG:  No results found for this basename: GLUCAP:5 in the last 168 hours  Recent Results (from the past 240 hour(s))  WOUND CULTURE     Status: Normal   Collection Time   01/10/12 11:35 AM      Component Value Range Status Comment   Culture Moderate STAPHYLOCOCCUS AUREUS   Final    GRAM STAIN Rare   Final    GRAM STAIN WBC   Final    GRAM STAIN No Squamous Epithelial Cells Seen   Final    GRAM STAIN Rare GRAM POSITIVE COCCI IN CLUSTERS   Final    Organism ID, Bacteria STAPHYLOCOCCUS AUREUS   Final   CULTURE, BLOOD (ROUTINE X 2)     Status: Normal (Preliminary result)   Collection Time   01/13/12  8:15 AM      Component Value Range Status  Comment   Specimen Description BLOOD RIGHT HAND   Final    Special Requests BOTTLES DRAWN AEROBIC AND ANAEROBIC 5CC EACH   Final    Culture  Setup Time 01/13/2012 18:40   Final    Culture     Final    Value:        BLOOD CULTURE RECEIVED NO GROWTH TO DATE CULTURE WILL BE HELD FOR 5 DAYS BEFORE ISSUING A FINAL NEGATIVE REPORT   Report Status PENDING   Incomplete   CULTURE, BLOOD (ROUTINE X 2)     Status: Normal (Preliminary result)   Collection Time   01/13/12  8:22 AM      Component Value Range Status Comment   Specimen Description BLOOD RIGHT ARM   Final    Special Requests BOTTLES DRAWN AEROBIC AND ANAEROBIC 5CC EACH   Final    Culture  Setup Time 01/13/2012 18:40   Final    Culture     Final    Value:        BLOOD CULTURE RECEIVED NO GROWTH TO DATE CULTURE WILL BE HELD FOR 5 DAYS BEFORE ISSUING A FINAL NEGATIVE REPORT   Report Status PENDING   Incomplete       Studies/Results: No results found.  Medications:  Scheduled:   . docusate sodium  100 mg Oral Daily  . enoxaparin (LOVENOX) injection  60 mg Subcutaneous Q24H  . HYDROmorphone  1 mg Intravenous Once  . lidocaine-EPINEPHrine  30 mL Intradermal Once  . liothyronine  25 mcg Oral Daily  . mirtazapine  30 mg Oral QHS  . piperacillin-tazobactam (ZOSYN)  IV  3.375 g Intravenous Q8H  . psyllium  1 packet Oral Daily  . sodium chloride  1,000 mL Intravenous Once  . vancomycin  1,000 mg Intravenous Q12H  . venlafaxine XR  150 mg Oral Daily  . verapamil  40 mg Oral TID   Continuous:  ZOX:[id number], HYDROmorphone (DILAUDID) injection, ondansetron (ZOFRAN) IV, ondansetron, oxyCODONE-acetaminophen  Assessment/Plan:  Principal Problem:  *Abscess of left thigh Active Problems:  Cellulitis of left thigh   Abscess and cellulitis of left thigh  Patient afebrile. But the wound apparently not improving despite broad spectrum coverage. Surgery plans to take him to OR today. Continue antibiotics for now.   IgA nephropathy    Chronic. Continue Verapamil. Was also on Lisinopril but has not taken it for a few months. He will talk to Dr. Abel Presto about resuming it. Will get renal function checked today as he is on Antibiotics.   Depression  Continue venlafaxine, Cytomel and Remeron. Patient has received ECT in the past.  Code Status: Full code  Family Communication: Wife at bedside Disposition Plan: Will likely return home when improved.   DVT Prophylaxis Enoxaparin  General Surgery to assume care per Dr. Fatima Sanger note. [facility] will be available as needed. Will follow up on labs ordered for today.   LOS: 4 days   [facility]  [facility] Pager [phone] 01/16/2012, 8:59 AM  If 8PM-8AM, please contact night-coverage at [email or web address], password [facility]

## 2012-01-16 NOTE — Anesthesia Preprocedure Evaluation (Signed)
Anesthesia Evaluation  Patient identified by MRN, date of birth, ID band Patient awake    Reviewed: Allergy & Precautions, H&P , NPO status , Patient's Chart, lab work & pertinent test results  Airway Mallampati: II TM Distance: >3 FB Neck ROM: full    Dental No notable dental hx. (+) Teeth Intact and Dental Advisory Given   Pulmonary neg pulmonary ROS,  breath sounds clear to auscultation  Pulmonary exam normal       Cardiovascular Exercise Tolerance: Good negative cardio ROS  Rhythm:regular Rate:Normal     Neuro/Psych negative neurological ROS  negative psych ROS   GI/Hepatic negative GI ROS, Neg liver ROS,   Endo/Other  negative endocrine ROS  Renal/GU Renal diseaseIgA nephropathy.  Labs OK  negative genitourinary   Musculoskeletal   Abdominal   Peds  Hematology negative hematology ROS (+)   Anesthesia Other Findings   Reproductive/Obstetrics negative OB ROS                           Anesthesia Physical Anesthesia Plan  ASA: II  Anesthesia Plan: General   Post-op Pain Management:    Induction: Intravenous  Airway Management Planned: LMA  Additional Equipment:   Intra-op Plan:   Post-operative Plan:   Informed Consent: I have reviewed the patients History and Physical, chart, labs and discussed the procedure including the risks, benefits and alternatives for the proposed anesthesia with the patient or authorized representative who has indicated his/her understanding and acceptance.   Dental Advisory Given  Plan Discussed with: CRNA and Surgeon  Anesthesia Plan Comments:         Anesthesia Quick Evaluation

## 2012-01-16 NOTE — Anesthesia Postprocedure Evaluation (Signed)
  Anesthesia Post-op Note  Patient: Kelly Johnston  Procedure(s) Performed: Procedure(s) (LRB): INCISION AND DRAINAGE ABSCESS (Left)  Patient Location: PACU  Anesthesia Type: General  Level of Consciousness: awake and alert   Airway and Oxygen Therapy: Patient Spontanous Breathing  Post-op Pain: mild  Post-op Assessment: Post-op Vital signs reviewed, Patient's Cardiovascular Status Stable, Respiratory Function Stable, Patent Airway and No signs of Nausea or vomiting  Last Vitals:  Filed Vitals:   01/16/12 1806  BP: 140/85  Pulse: 99  Temp: 36.6 C  Resp: 22    Post-op Vital Signs: stable   Complications: No apparent anesthesia complications

## 2012-01-17 LAB — CBC
HCT: 42.9 % (ref 39.0–52.0)
Hemoglobin: 13.8 g/dL (ref 13.0–17.0)
MCH: 29.6 pg (ref 26.0–34.0)
MCV: 92.1 fL (ref 78.0–100.0)
RBC: 4.66 MIL/uL (ref 4.22–5.81)

## 2012-01-17 LAB — BASIC METABOLIC PANEL
BUN: 17 mg/dL (ref 6–23)
CO2: 27 mEq/L (ref 19–32)
Calcium: 8.5 mg/dL (ref 8.4–10.5)
Glucose, Bld: 101 mg/dL — ABNORMAL HIGH (ref 70–99)
Sodium: 140 mEq/L (ref 135–145)

## 2012-01-17 MED ORDER — OXYCODONE HCL 5 MG PO TABS: 5.0000 mg | ORAL_TABLET | ORAL | Status: DC | PRN

## 2012-01-17 MED ORDER — DSS 100 MG PO CAPS: 100.0000 mg | ORAL_CAPSULE | Freq: Every day | ORAL | Status: DC

## 2012-01-17 MED ORDER — DOXYCYCLINE HYCLATE 50 MG PO CAPS: 100.0000 mg | ORAL_CAPSULE | Freq: Two times a day (BID) | ORAL | Status: DC

## 2012-01-17 NOTE — Discharge Summary (Signed)
Physician Discharge Summary  Patient ID: Kelly Johnston MRN: [id number] DOB/AGE: 42/04/1970 42 y.o.  Admit date: 01/12/2012 Discharge date: 01/17/2012  Admitting Diagnosis: Left thigh abscess, cellulitis  Discharge Diagnosis Patient Active Problem List   Diagnosis Date Noted  . Depression 01/16/2012  . Abscess of left thigh 01/13/2012  . Cellulitis of left thigh 01/13/2012  . IgA nephropathy 01/10/2012    Consultants Internal medicine  Procedures Left thigh abscess incision and drainage 01/16/2012  Hospital Course:  41 yr old male who presented to [facility] with abscess and cellulitis of the left upper thigh.  He had been treated as an outpatient with antibiotics and small incision and drainage but this failed and he worsened.  He presented with fevers, chills and extensive cellulitis and drainage from the existing wound.  He was admitted and placed on IV antibiotics.  His outpatient cultures grew MSSA.  Over the next 2 days the inferior portion of erythema improved however the superior portion worsened and developed and area of fluctuance.  He was taken to the OR and under went procedure above.  He tolerated this well.  On POD#1 the patient's pain was improved significantly, he was afebrile and the erythema had almost resolved.  His wound bed was healthy with less then 10% necrotic tissue.  He was tolerating his diet, ambulating well and was felt ready for discharge.  He will get his last dose of IV antibiotics this AM.  He will have home health to help with dressings and will follow up next week for wound check.     Medication List     As of 01/17/2012  8:46 AM    TAKE these medications         doxycycline 50 MG capsule   Commonly known as: VIBRAMYCIN   Take 2 capsules (100 mg total) by mouth 2 (two) times daily.      DSS 100 MG Caps   Take 100 mg by mouth daily.      liothyronine 25 MCG tablet   Commonly known as: CYTOMEL   Take 25 mcg by mouth daily.      mirtazapine  30 MG tablet   Commonly known as: REMERON   Take 30 mg by mouth at bedtime.      oxyCODONE 5 MG immediate release tablet   Commonly known as: Oxy IR/ROXICODONE   Take 1-2 tablets (5-10 mg total) by mouth every 4 (four) hours as needed for pain.      oxyCODONE-acetaminophen 5-325 MG per tablet   Commonly known as: PERCOCET/ROXICET   Take 1 tablet by mouth every 4 (four) hours as needed for pain.      sildenafil 100 MG tablet   Commonly known as: VIAGRA   Take 100 mg by mouth daily as needed.      sildenafil 100 MG tablet   Commonly known as: VIAGRA   Take 1 tablet (100 mg total) by mouth daily as needed for erectile dysfunction.      sulfamethoxazole-trimethoprim 800-160 MG per tablet   Commonly known as: BACTRIM DS,SEPTRA DS   Take 1 tablet by mouth every 12 (twelve) hours. Pt to take for 7 days. Pt is on day 2 of therapy      venlafaxine XR 150 MG 24 hr capsule   Commonly known as: EFFEXOR-XR   Take 150 mg by mouth daily.      verapamil 40 MG tablet   Commonly known as: CALAN   Take 40 mg by mouth 3 (three)  times daily.             Follow-up Information    Follow up with [facility] On 01/22/2012. (Arrive at 1:45 for 2pm appointment)    Contact information:   [address] [place] [address] [phone]         Signed: Denny Levy [facility] [phone]  01/17/2012, 8:46 AM

## 2012-01-17 NOTE — Discharge Instructions (Signed)
1.  Change dressing daily with wet gauze in base of wound, dry gauze over top and ABD dressing over top of all dressings. 2.  May shower but cover dressing well, then change dressing after shower.  Use saline to rinse base of wound. 3.  May change outer dressing as needed during day for saturation. 4.  Call with fever >101, foul smell from wound, purulent drainage, increasing redness or pain. 5.  May drive if not taking narcotics. 6.  Finish antibiotics as prescribed. 7.  Follow up Tuesday 11/26 at 1:45 for wound check.  Abscess Care After An abscess (also called a boil or furuncle) is an infected area that contains a collection of pus. Signs and symptoms of an abscess include pain, tenderness, redness, or hardness, or you may feel a moveable soft area under your skin. An abscess can occur anywhere in the body. The infection may spread to surrounding tissues causing cellulitis. A cut (incision) by the surgeon was made over your abscess and the pus was drained out. Gauze may have been packed into the space to provide a drain that will allow the cavity to heal from the inside outwards. The boil may be painful for 5 to 7 days. Most people with a boil do not have high fevers. Your abscess, if seen early, may not have localized, and may not have been lanced. If not, another appointment may be required for this if it does not get better on its own or with medications. HOME CARE INSTRUCTIONS   Only take over-the-counter or prescription medicines for pain, discomfort, or fever as directed by your caregiver.  When you bathe, soak and then remove gauze or iodoform packs at least daily or as directed by your caregiver. You may then wash the wound gently with mild soapy water. Repack with gauze or do as your caregiver directs. SEEK IMMEDIATE MEDICAL CARE IF:   You develop increased pain, swelling, redness, drainage, or bleeding in the wound site.  You develop signs of generalized infection including muscle  aches, chills, fever, or a general ill feeling.  An oral temperature above 102 F (38.9 C) develops, not controlled by medication. See your caregiver for a recheck if you develop any of the symptoms described above. If medications (antibiotics) were prescribed, take them as directed. Document Released: 08/31/2004 Document Revised: 05/07/2011 Document Reviewed: 04/28/2007 [facility] Patient Information 2013 [place], [place].

## 2012-01-17 NOTE — Discharge Summary (Signed)
Patient improved.  Home health nursing for dressing changes.   Wilmon Arms. Corliss Skains, MD, [facility]  01/17/2012 9:03 AM

## 2012-01-18 ENCOUNTER — Encounter (HOSPITAL_COMMUNITY): Payer: Self-pay | Admitting: Surgery

## 2012-01-19 LAB — CULTURE, BLOOD (ROUTINE X 2): Culture: NO GROWTH

## 2012-01-22 ENCOUNTER — Encounter (INDEPENDENT_AMBULATORY_CARE_PROVIDER_SITE_OTHER): Payer: Self-pay | Admitting: General Surgery

## 2012-01-22 ENCOUNTER — Ambulatory Visit (INDEPENDENT_AMBULATORY_CARE_PROVIDER_SITE_OTHER): Payer: BC Managed Care – PPO | Admitting: General Surgery

## 2012-01-22 VITALS — BP 130/96 | HR 100 | Temp 98.1°F | Resp 18 | Ht 77.0 in | Wt 264.0 lb

## 2012-01-22 DIAGNOSIS — Z9889 Other specified postprocedural states: Secondary | ICD-10-CM

## 2012-01-22 DIAGNOSIS — L02419 Cutaneous abscess of limb, unspecified: Secondary | ICD-10-CM

## 2012-01-22 DIAGNOSIS — L02415 Cutaneous abscess of right lower limb: Secondary | ICD-10-CM

## 2012-01-22 NOTE — Patient Instructions (Signed)
Continue daily dressing changes

## 2012-01-22 NOTE — Progress Notes (Signed)
Kelly Johnston 1970-03-15 [id number]  Patient is s/p I&D of right thigh abscess by Dr. Corliss Skains last Thursday.  The patient has been doing daily dressing changes at home with no issues.  PE: Ext: wound is healing well with good granulation tissue.  No purulent drainage or evidence of continued infection.  Some induration still present, but minimal.  Packing replaced.  A/P: 1. Right thigh abscess, s/p I&D Continue normal saline WD dressing changes daily to wound.  Will RTC in 2 weeks for another wound check.

## 2012-02-05 ENCOUNTER — Encounter (INDEPENDENT_AMBULATORY_CARE_PROVIDER_SITE_OTHER): Payer: BC Managed Care – PPO

## 2012-02-19 ENCOUNTER — Encounter (INDEPENDENT_AMBULATORY_CARE_PROVIDER_SITE_OTHER): Payer: BC Managed Care – PPO | Admitting: General Surgery

## 2012-03-05 ENCOUNTER — Ambulatory Visit (INDEPENDENT_AMBULATORY_CARE_PROVIDER_SITE_OTHER): Payer: BC Managed Care – PPO | Admitting: Surgery

## 2012-03-05 ENCOUNTER — Encounter (INDEPENDENT_AMBULATORY_CARE_PROVIDER_SITE_OTHER): Payer: Self-pay | Admitting: Surgery

## 2012-03-05 VITALS — BP 136/92 | HR 93 | Temp 98.6°F | Ht 77.0 in | Wt 276.0 lb

## 2012-03-05 DIAGNOSIS — L02416 Cutaneous abscess of left lower limb: Secondary | ICD-10-CM

## 2012-03-05 DIAGNOSIS — L02419 Cutaneous abscess of limb, unspecified: Secondary | ICD-10-CM

## 2012-03-05 NOTE — Progress Notes (Signed)
Status post incision and drainage of a very large left thigh abscess on 01/16/12. The patient is doing well. The incision has completely healed. He has a firm scar tissue in this area but no sign of infection. No drainage. I told that the scar tissue should soften up over the next several months. No need for antibiotics or further treatment this time. He may followup with [place] as needed.  Wilmon Arms. Corliss Skains, MD, [facility]  03/05/2012 10:11 AM

## 2012-03-07 ENCOUNTER — Ambulatory Visit (INDEPENDENT_AMBULATORY_CARE_PROVIDER_SITE_OTHER): Payer: BC Managed Care – PPO | Admitting: Family Medicine

## 2012-03-07 VITALS — BP 167/98 | HR 102 | Temp 99.0°F | Resp 16 | Ht 77.0 in | Wt 227.8 lb

## 2012-03-07 DIAGNOSIS — L03119 Cellulitis of unspecified part of limb: Secondary | ICD-10-CM

## 2012-03-07 DIAGNOSIS — L03116 Cellulitis of left lower limb: Secondary | ICD-10-CM

## 2012-03-07 DIAGNOSIS — L02419 Cutaneous abscess of limb, unspecified: Secondary | ICD-10-CM

## 2012-03-07 LAB — POCT CBC
Granulocyte percent: 68.2 %G (ref 37–80)
HCT, POC: 54.1 % — AB (ref 43.5–53.7)
MCH, POC: 29.6 pg (ref 27–31.2)
MCV: 94.8 fL (ref 80–97)
POC LYMPH PERCENT: 24.1 %L (ref 10–50)
RBC: 5.71 M/uL (ref 4.69–6.13)
WBC: 14 10*3/uL — AB (ref 4.6–10.2)

## 2012-03-07 MED ORDER — DOXYCYCLINE HYCLATE 100 MG PO TABS: 100.0000 mg | ORAL_TABLET | Freq: Two times a day (BID) | ORAL | Status: DC

## 2012-03-07 MED ORDER — SULFAMETHOXAZOLE-TMP DS 800-160 MG PO TABS: 1.0000 | ORAL_TABLET | Freq: Two times a day (BID) | ORAL | Status: DC

## 2012-03-07 NOTE — Patient Instructions (Addendum)
Keep a close eye on your condition.  If you are getting worse- clammy, fever over 101, vomiting, or weak- please call or go to the ED right away.  Please come by clinic tomorrow for a recheck.

## 2012-03-07 NOTE — Progress Notes (Signed)
I have spoken to [facility] the Triage nurse at [facility] and Dr Andrey Campanile is on call. She will have him call Dr Patsy Lager back concerning the patient.

## 2012-03-07 NOTE — Progress Notes (Signed)
[facility] [address], [place] [address] [phone]  Date:  03/07/2012   Name:  Kelly Johnston   DOB:  [birth year]   MRN:  [id number]  PCP:  Provider Not In System    Chief Complaint: Cellulitis   History of Present Illness:  Kelly Johnston is a 42 y.o. very pleasant male patient who presents with the following:  I saw this gentleman in November with cellulitis of his left leg and associated illness.  He was noted to have a very high wbc count.  We did an I and D here but suspected there was more pus present due to the severity of his symtpoms.  He was sent for evaluation at the ED and sent home, then went back 2 days later and was admitted for IV abx.  Dr. Harlon Flor performed an I and D in the OR on November 20th.  He was discharged home on 01/17/12, and just saw Dr. Corliss Skains for a recheck 2 days ago.  At that time he was doing ok.    Yesterday he noted return of redness, heat and soreness on his left thigh- distal to the area that he had last time. He recently had a couple of superficial pustules- ?related to folliculitis- which he did pick at.  Then the redness and heat/ tenderness spread.  He also feels fatigued, achy, and had noted a subjective fever. Temp here today is 99 and he is slightly tachycardic as well.  However, he does not feel faint or sweaty as he did last time.    His last wound culture grew out MSSA.  He was sent home with doxy and septra po which he tolerated well  Patient Active Problem List  Diagnosis  . IgA nephropathy  . Abscess of left thigh  . Cellulitis of left thigh  . Depression    Past Medical History  Diagnosis Date  . Depression   . Chronic kidney disease   . Heart murmur     Past Surgical History  Procedure Date  . Chest surgery   . Pectus excavatum repair   . Incision and drainage abscess 01/16/2012    Procedure: INCISION AND DRAINAGE ABSCESS;  Surgeon: Wilmon Arms. Corliss Skains, MD;  Location: [facility];  Service: General;   Laterality: Left;    History  Substance Use Topics  . Smoking status: Former Smoker -- 0.1 packs/day for 2 years    Types: Cigarettes  . Smokeless tobacco: Never Used  . Alcohol Use: No    Family History  Problem Relation Age of Onset  . Testicular cancer Father     No Known Allergies  Medication list has been reviewed and updated.  Current Outpatient Prescriptions on File Prior to Visit  Medication Sig Dispense Refill  . docusate sodium 100 MG CAPS Take 100 mg by mouth daily.  10 capsule    . liothyronine (CYTOMEL) 25 MCG tablet Take 25 mcg by mouth daily.      . mirtazapine (REMERON) 30 MG tablet Take 30 mg by mouth at bedtime.      . sildenafil (VIAGRA) 100 MG tablet Take 100 mg by mouth daily as needed.      . venlafaxine XR (EFFEXOR-XR) 150 MG 24 hr capsule Take 150 mg by mouth daily.      . verapamil (CALAN) 40 MG tablet Take 40 mg by mouth 3 (three) times daily.        Review of Systems:  As per HPI-  otherwise negative.   Physical Examination: Filed Vitals:   03/07/12 1432  BP: 167/98  Pulse: 118  Temp: 99 F (37.2 C)  Resp: 16   Filed Vitals:   03/07/12 1432  Height: 6\' 5"  (1.956 m)  Weight: 227 lb 12.8 oz (103.329 kg)   Body mass index is 27.01 kg/(m^2). Ideal Body Weight: Weight in (lb) to have BMI = 25: 210.4   GEN: WDWN, NAD, Non-toxic, A & O x 3 HEENT: Atraumatic, Normocephalic. Neck supple. No masses, No LAD. Ears and Nose: No external deformity. CV: RRR, No M/G/R. No JVD. No thrill. No extra heart sounds. PULM: CTA B, no wheezes, crackles, rhonchi. No retractions. No resp. distress. No accessory muscle use. ABD: S, NT, ND, +BS. No rebound. No HSM. EXTR: No c/c/e NEURO Normal gait.  PSYCH: Normally interactive. Conversant. Not depressed or anxious appearing.  Calm demeanor.  Left thigh: there are two areas which appear to have been picked pustules- surrounded by about 6 by 5 inch area of induration (which was outlined), heat and redness.   There is no fluctuance to suggest a drainable abscess.   Results for orders placed in visit on 03/07/12  POCT CBC      Component Value Range   WBC 14.0 (*) 4.6 - 10.2 K/uL   Lymph, poc 3.4  0.6 - 3.4   POC LYMPH PERCENT 24.1  10 - 50 %L   MID (cbc) 1.1 (*) 0 - 0.9   POC MID % 7.7  0 - 12 %M   POC Granulocyte 9.5 (*) 2 - 6.9   Granulocyte percent 68.2  37 - 80 %G   RBC 5.71  4.69 - 6.13 M/uL   Hemoglobin 16.9  14.1 - 18.1 g/dL   HCT, POC 11.9 (*) 14.7 - 53.7 %   MCV 94.8  80 - 97 fL   MCH, POC 29.6  27 - 31.2 pg   MCHC 31.2 (*) 31.8 - 35.4 g/dL   RDW, POC 82.9     Platelet Count, POC 403  142 - 424 K/uL   MPV 8.2  0 - 99.8 fL    Spoke with Dr. Andrey Campanile, on call for [facility].  At this time I do not feel there is any area to I and D. Will start treatment with doxy and septra PO, and will follow-up closely.  He will recheck tomorrow. If not making improvement soon will need I and D and possible admission.   Assessment and Plan: 1. Cellulitis of left thigh  POCT CBC, doxycycline (VIBRA-TABS) 100 MG tablet, sulfamethoxazole-trimethoprim (BACTRIM DS) 800-160 MG per tablet   Treat as above with septra and doxy.  Close follow needed, return to care precautions discussed in detail.    Meds ordered this encounter  Medications  . doxycycline (VIBRA-TABS) 100 MG tablet    Sig: Take 1 tablet (100 mg total) by mouth 2 (two) times daily.    Dispense:  20 tablet    Refill:  0  . sulfamethoxazole-trimethoprim (BACTRIM DS) 800-160 MG per tablet    Sig: Take 1 tablet by mouth 2 (two) times daily.    Dispense:  20 tablet    Refill:  0     Maurice Ramseur, MD

## 2012-03-08 ENCOUNTER — Ambulatory Visit (INDEPENDENT_AMBULATORY_CARE_PROVIDER_SITE_OTHER): Payer: BC Managed Care – PPO | Admitting: Emergency Medicine

## 2012-03-08 VITALS — BP 149/94 | HR 103 | Temp 97.7°F | Resp 17 | Ht 77.5 in | Wt 274.0 lb

## 2012-03-08 DIAGNOSIS — L0291 Cutaneous abscess, unspecified: Secondary | ICD-10-CM

## 2012-03-08 DIAGNOSIS — L0390 Cellulitis, unspecified: Secondary | ICD-10-CM

## 2012-03-08 DIAGNOSIS — R030 Elevated blood-pressure reading, without diagnosis of hypertension: Secondary | ICD-10-CM

## 2012-03-08 NOTE — Progress Notes (Signed)
[facility] [address] [place] [address] [phone]  Date:  03/08/2012   Name:  Kelly Johnston   DOB:  02-04-1971   MRN:  [id number]  PCP:  Provider Not In System    Chief Complaint: Follow-up   History of Present Illness:  Kelly Johnston is a 42 y.o. very pleasant male patient who presents with the following:  Seen yesterday with cellulitis of left thigh and put on doxy and septra.  Still had fever last night but says pain and tenderness is less today.  No nausea or vomiting.  No drainage.  Patient Active Problem List  Diagnosis  . IgA nephropathy  . Abscess of left thigh  . Cellulitis of left thigh  . Depression    Past Medical History  Diagnosis Date  . Depression   . Chronic kidney disease   . Heart murmur     Past Surgical History  Procedure Date  . Chest surgery   . Pectus excavatum repair   . Incision and drainage abscess 01/16/2012    Procedure: INCISION AND DRAINAGE ABSCESS;  Surgeon: Wilmon Arms. Corliss Skains, MD;  Location: [facility];  Service: General;  Laterality: Left;    History  Substance Use Topics  . Smoking status: Former Smoker -- 0.1 packs/day for 2 years    Types: Cigarettes  . Smokeless tobacco: Never Used  . Alcohol Use: No    Family History  Problem Relation Age of Onset  . Testicular cancer Father     No Known Allergies  Medication list has been reviewed and updated.  Current Outpatient Prescriptions on File Prior to Visit  Medication Sig Dispense Refill  . docusate sodium 100 MG CAPS Take 100 mg by mouth daily.  10 capsule    . doxycycline (VIBRA-TABS) 100 MG tablet Take 1 tablet (100 mg total) by mouth 2 (two) times daily.  20 tablet  0  . liothyronine (CYTOMEL) 25 MCG tablet Take 25 mcg by mouth daily.      . mirtazapine (REMERON) 30 MG tablet Take 30 mg by mouth at bedtime.      . sildenafil (VIAGRA) 100 MG tablet Take 100 mg by mouth daily as needed.      . sulfamethoxazole-trimethoprim (BACTRIM DS)  800-160 MG per tablet Take 1 tablet by mouth 2 (two) times daily.  20 tablet  0  . venlafaxine XR (EFFEXOR-XR) 150 MG 24 hr capsule Take 150 mg by mouth daily.      . verapamil (CALAN) 40 MG tablet Take 40 mg by mouth 3 (three) times daily.        Review of Systems:  As per HPI, otherwise negative.    Physical Examination: Filed Vitals:   03/08/12 1223  BP: 149/94  Pulse: 103  Temp: 97.7 F (36.5 C)  Resp: 17   Filed Vitals:   03/08/12 1223  Height: 6' 5.5" (1.969 m)  Weight: 274 lb (124.286 kg)   Body mass index is 32.07 kg/(m^2). Ideal Body Weight: Weight in (lb) to have BMI = 25: 213.1    GEN: WDWN, NAD, Non-toxic, Alert & Oriented x 3 HEENT: Atraumatic, Normocephalic.  Ears and Nose: No external deformity. EXTR: No clubbing/cyanosis/edema NEURO: antalgic gait.  PSYCH: Normally interactive. Conversant. Not depressed or anxious appearing.  Calm demeanor.  LEFT THIGH:  Cellulitis with no fluctuance.  Erythema has not spread outside demarkation.    Assessment and Plan: Cellulitis Elevated blood pressure Follow up in two days sooner if  worse Elevate Local heat  Carmelina Dane, MD

## 2012-03-08 NOTE — Patient Instructions (Signed)
Cellulitis Cellulitis is an infection of the skin and the tissue beneath it. The infected area is usually red and tender. Cellulitis occurs most often in the arms and lower legs.  CAUSES  Cellulitis is caused by bacteria that enter the skin through cracks or cuts in the skin. The most common types of bacteria that cause cellulitis are Staphylococcus and Streptococcus. SYMPTOMS   Redness and warmth.  Swelling.  Tenderness or pain.  Fever. DIAGNOSIS  Your caregiver can usually determine what is wrong based on a physical exam. Blood tests may also be done. TREATMENT  Treatment usually involves taking an antibiotic medicine. HOME CARE INSTRUCTIONS   Take your antibiotics as directed. Finish them even if you start to feel better.  Keep the infected arm or leg elevated to reduce swelling.  Apply a warm cloth to the affected area up to 4 times per day to relieve pain.  Only take over-the-counter or prescription medicines for pain, discomfort, or fever as directed by your caregiver.  Keep all follow-up appointments as directed by your caregiver. SEEK MEDICAL CARE IF:   You notice red streaks coming from the infected area.  Your red area gets larger or turns dark in color.  Your bone or joint underneath the infected area becomes painful after the skin has healed.  Your infection returns in the same area or another area.  You notice a swollen bump in the infected area.  You develop new symptoms. SEEK IMMEDIATE MEDICAL CARE IF:   You have a fever.  You feel very sleepy.  You develop vomiting or diarrhea.  You have a general ill feeling (malaise) with muscle aches and pains. MAKE SURE YOU:   Understand these instructions.  Will watch your condition.  Will get help right away if you are not doing well or get worse. Document Released: 11/22/2004 Document Revised: 08/14/2011 Document Reviewed: 04/30/2011 [facility] Patient Information 2013 [place], [place]. Hypertension As  your heart beats, it forces blood through your arteries. This force is your blood pressure. If the pressure is too high, it is called hypertension (HTN) or high blood pressure. HTN is dangerous because you may have it and not know it. High blood pressure may mean that your heart has to work harder to pump blood. Your arteries may be narrow or stiff. The extra work puts you at risk for heart disease, stroke, and other problems.  Blood pressure consists of two numbers, a higher number over a lower, 110/72, for example. It is stated as "110 over 72." The ideal is below 120 for the top number (systolic) and under 80 for the bottom (diastolic). Write down your blood pressure today. You should pay close attention to your blood pressure if you have certain conditions such as:  Heart failure.  Prior heart attack.  Diabetes  Chronic kidney disease.  Prior stroke.  Multiple risk factors for heart disease. To see if you have HTN, your blood pressure should be measured while you are seated with your arm held at the level of the heart. It should be measured at least twice. A one-time elevated blood pressure reading (especially in the Emergency Department) does not mean that you need treatment. There may be conditions in which the blood pressure is different between your right and left arms. It is important to see your caregiver soon for a recheck. Most people have essential hypertension which means that there is not a specific cause. This type of high blood pressure may be lowered by changing lifestyle factors  such as:  Stress.  Smoking.  Lack of exercise.  Excessive weight.  Drug/tobacco/alcohol use.  Eating less salt. Most people do not have symptoms from high blood pressure until it has caused damage to the body. Effective treatment can often prevent, delay or reduce that damage. TREATMENT  When a cause has been identified, treatment for high blood pressure is directed at the cause. There are a  large number of medications to treat HTN. These fall into several categories, and your caregiver will help you select the medicines that are best for you. Medications may have side effects. You should review side effects with your caregiver. If your blood pressure stays high after you have made lifestyle changes or started on medicines,   Your medication(s) may need to be changed.  Other problems may need to be addressed.  Be certain you understand your prescriptions, and know how and when to take your medicine.  Be sure to follow up with your caregiver within the time frame advised (usually within two weeks) to have your blood pressure rechecked and to review your medications.  If you are taking more than one medicine to lower your blood pressure, make sure you know how and at what times they should be taken. Taking two medicines at the same time can result in blood pressure that is too low. SEEK IMMEDIATE MEDICAL CARE IF:  You develop a severe headache, blurred or changing vision, or confusion.  You have unusual weakness or numbness, or a faint feeling.  You have severe chest or abdominal pain, vomiting, or breathing problems. MAKE SURE YOU:   Understand these instructions.  Will watch your condition.  Will get help right away if you are not doing well or get worse. Document Released: 02/12/2005 Document Revised: 05/07/2011 Document Reviewed: 10/03/2007 [facility] Patient Information 2013 [place], [place].

## 2012-03-14 ENCOUNTER — Ambulatory Visit (INDEPENDENT_AMBULATORY_CARE_PROVIDER_SITE_OTHER): Payer: BC Managed Care – PPO | Admitting: Family Medicine

## 2012-03-14 ENCOUNTER — Encounter: Payer: Self-pay | Admitting: Family Medicine

## 2012-03-14 VITALS — BP 135/92 | HR 105 | Temp 97.4°F | Resp 20 | Ht 77.0 in | Wt 276.0 lb

## 2012-03-14 DIAGNOSIS — R599 Enlarged lymph nodes, unspecified: Secondary | ICD-10-CM

## 2012-03-14 DIAGNOSIS — G479 Sleep disorder, unspecified: Secondary | ICD-10-CM

## 2012-03-14 NOTE — Patient Instructions (Addendum)
Sleep Apnea  Sleep apnea is a sleep disorder characterized by abnormal pauses in breathing while you sleep. When your breathing pauses, the level of oxygen in your blood decreases. This causes you to move out of deep sleep and into light sleep. As a result, your quality of sleep is poor, and the system that carries your blood throughout your body (cardiovascular system) experiences stress. If sleep apnea remains untreated, the following conditions can develop:  High blood pressure (hypertension).  Coronary artery disease.  Inability to achieve or maintain an erection (impotence).  Impairment of your thought process (cognitive dysfunction). There are three types of sleep apnea: 1. Obstructive sleep apnea Pauses in breathing during sleep because of a blocked airway. 2. Central sleep apnea Pauses in breathing during sleep because the area of the brain that controls your breathing does not send the correct signals to the muscles that control breathing. 3. Mixed sleep apnea A combination of both obstructive and central sleep apnea. RISK FACTORS The following risk factors can increase your risk of developing sleep apnea:  Being overweight.  Smoking.  Having narrow passages in your nose and throat.  Being of older age.  Being male.  Alcohol use.  Sedative and tranquilizer use.  Ethnicity. Among individuals younger than 35 years, African Americans are at increased risk of sleep apnea. SYMPTOMS   Difficulty staying asleep.  Daytime sleepiness and fatigue.  Loss of energy.  Irritability.  Loud, heavy snoring.  Morning headaches.  Trouble concentrating.  Forgetfulness.  Decreased interest in sex. DIAGNOSIS  In order to diagnose sleep apnea, your caregiver will perform a physical examination. Your caregiver may suggest that you take a home sleep test. Your caregiver may also recommend that you spend the night in a sleep lab. In the sleep lab, several monitors record  information about your heart, lungs, and brain while you sleep. Your leg and arm movements and blood oxygen level are also recorded. TREATMENT The following actions may help to resolve mild sleep apnea:  Sleeping on your side.   Using a decongestant if you have nasal congestion.   Avoiding the use of depressants, including alcohol, sedatives, and narcotics.   Losing weight and modifying your diet if you are overweight. There also are devices and treatments to help open your airway:  Oral appliances. These are custom-made mouthpieces that shift your lower jaw forward and slightly open your bite. This opens your airway.  Devices that create positive airway pressure. This positive pressure "splints" your airway open to help you breathe better during sleep. The following devices create positive airway pressure:  Continuous positive airway pressure (CPAP) device. The CPAP device creates a continuous level of air pressure with an air pump. The air is delivered to your airway through a mask while you sleep. This continuous pressure keeps your airway open.  Nasal expiratory positive airway pressure (EPAP) device. The EPAP device creates positive air pressure as you exhale. The device consists of single-use valves, which are inserted into each nostril and held in place by adhesive. The valves create very little resistance when you inhale but create much more resistance when you exhale. That increased resistance creates the positive airway pressure. This positive pressure while you exhale keeps your airway open, making it easier to breath when you inhale again.  Bilevel positive airway pressure (BPAP) device. The BPAP device is used mainly in patients with central sleep apnea. This device is similar to the CPAP device because it also uses an air pump to deliver  continuous air pressure through a mask. However, with the BPAP machine, the pressure is set at two different levels. The pressure when you  exhale is lower than the pressure when you inhale.  Surgery. Typically, surgery is only done if you cannot comply with less invasive treatments or if the less invasive treatments do not improve your condition. Surgery involves removing excess tissue in your airway to create a wider passage way. Document Released: 02/02/2002 Document Revised: 08/14/2011 Document Reviewed: 06/21/2011 [facility] Patient Information 2013 [place], [place].    You will be contacted to schedule a follow-up appointment once the results of the Sleep Study are back and I have reviewed them; I may have you see a sleep specialist and/or ENT specialist.

## 2012-03-14 NOTE — Progress Notes (Signed)
S:  This 42 y.o. Cauc male is here for evaluation of sleep disturbance. Wife reports pt seems "to be fighting for breath" as he sleeps. He admits to snoring and daytime fatigue but denies napping during the day. This has been a problem "for a long time".  Denies hx of allergies or chronic sore throat or frequent pharyngitis. His father snored but no other family hx of apnea.   Pt sees a psychiatrist- Dr. Jennelle Human- for treatment of Depression; he also takes Cytomel for treatment of Hypothyroidism.  ROS: As per HPI. Negative for fever/ chills, unexplained weight change, CP or tightness, palpitations, SOB or DOE, cough, edema, myalgias, rash or pallor, HA, dizziness, weakness, numbness or syncope.   O:  Filed Vitals:   03/14/12 0856  BP: 135/92  Pulse: 105  Temp: 97.4 F (36.3 C)  Resp: 20   GEN: In NAD; WN,WD. HEENT: [place]/AT; EOMI w/ clear conj and sclerae; EACs/ TMs normal. Posterior ph w/ mild erythema and cobblestoning but no exudate.               Hard palate is high-arched and narrow. Nasal passage/ bridge of nose is narrow. NECK: Supple; Left anterior cervical node palpable, nontender. COR: RRR; no m/g/r. LUNGS: CTA; normal resp rate and effort. BS sounds distant at bases due to size of thorax. SKIN: W&D; no rashes or pallor. MS: FROM in all ext; no deformities. NEURO: A&O x 3; CNs intact. Nonfocal.   A/P: 1. Sleep disturbance  Ambulatory referral to Sleep Studies  2. Enlarged lymph node  Monitor; recheck at follow-up visit when returns for review of Sleep Study.

## 2012-03-17 ENCOUNTER — Encounter: Payer: Self-pay | Admitting: Family Medicine

## 2012-03-17 DIAGNOSIS — G479 Sleep disorder, unspecified: Secondary | ICD-10-CM | POA: Insufficient documentation

## 2012-03-17 DIAGNOSIS — E669 Obesity, unspecified: Secondary | ICD-10-CM | POA: Insufficient documentation

## 2012-05-13 ENCOUNTER — Ambulatory Visit (INDEPENDENT_AMBULATORY_CARE_PROVIDER_SITE_OTHER): Payer: BC Managed Care – PPO

## 2012-05-13 VITALS — BP 148/96 | Ht 79.0 in | Wt 280.0 lb

## 2012-05-13 DIAGNOSIS — R0683 Snoring: Secondary | ICD-10-CM

## 2012-05-13 DIAGNOSIS — G4700 Insomnia, unspecified: Secondary | ICD-10-CM

## 2012-05-13 DIAGNOSIS — G4710 Hypersomnia, unspecified: Secondary | ICD-10-CM

## 2012-05-13 DIAGNOSIS — G4761 Periodic limb movement disorder: Secondary | ICD-10-CM

## 2012-05-13 DIAGNOSIS — G4733 Obstructive sleep apnea (adult) (pediatric): Secondary | ICD-10-CM

## 2012-05-15 NOTE — Progress Notes (Deleted)
Patient Name:  Kelly Johnston  Date of Birth:  06/02/1930  Medical Record #:  [id number]  Procedure #:  [id number]  Study Date:  04/24/2012  Date Scored: 05/09/2012   Date Interpreted:         Referring Physician:  Avie Echevaria, MD    INDICATION: Evaluate for apnea  CLINICAL HISTORY:  The patient is a 42 year old with Secondary Parkinsonism, Bipolar disorder, Prostate cancer treated with external radiation, Hyperlipidemia, melanoma, statin induced Myopathy, Obstructive Sleep Apnea treated with UPPP, TIA, Dementia, Shingles, Atrial fibrillation, Pacemaker, and Cardiovascular disease.  The patient complains of REM Behavior Disorder with frequent dream enactments, excessive daytime sleepiness, snoring, kicking at night, uncomfortable sensations or urges to move the legs, vivid dreams, a depressed mood, memory problems, anxiety, short temper, short attention span.  His Epworth Sleepiness Score is 14 out of a maximum of 24. His medications include Lithium Carbonate, Vistaril, Lopressor, Sinemet, Gabapentin, Vytorin, Trileptal, Aspirin, Exelon, and Vitamin D. His height is 5'8" in and weight is 180 lb for a BMI of 27.4.  STUDY ACQUISITION: This is a multichannel digital polysomnogram utilizing the [employer] Profusion 3 system.   Electrodes and sensors were applied and monitored per AASM specifications.   EEG, EOG, Chin and Limb EMG, ECG, Snore and Nasal Pressure channels were sampled at 256hz .  Thermal Airflow and  Respiratory Effort were sampled at 128hz .    End Tidal CO2 was sampled at 64hz .   Oximetry was sampled at 16hz . Digital video and audio were recorded.      STUDY SCORING TECHNIQUE: The study is scored according to the most recent American Academy of Sleep Medicine (AASM) criteria for sleep staging, periodic limb movements, arousals, and respiratory events. Respiratory events are scored in accordance to the AASM Adult scoring rules, using following criteria: Obstructive apneas are scored as >90%  fall in signal amplitude for at least 90% of the entire respiratory event compared to the pre-event baseline amplitudes with continued or increased effort for at least 10 seconds. Obstructive hypopneas are scored as at least 30% fall in amplitude on the PTAF compared to pre-event baseline excursions for at least 10 seconds followed by an arousal, awakening or at least 4% desaturation (per Medicare guidelines) or 3% desaturation (per AASM criteria for all other insurances). The fall in signal amplitude should last for at least 90% of the entire respiratory event. Central apneas not occurring post arousal can be scored if no effort and no airflow are present for at least 10 seconds. Central apneas occurring post-arousal are scored if no airflow and no effort are present for at least 10 seconds duration or if no airflow and no effort are present for at least 10 seconds and are followed by an arousal, awakening or at least 3% desaturation. The apnea index (AI) reflects the number of apneas per hour of total sleep time (TST). The hypopnea index (HI) is the number of hypopneas per hour of TST. The apnea hypopnea index (AHI) reflects the number of apneas and hypopneas per hour of TST.   INTERVENTIONS:      None  SLEEP-DISORDERED BREATHING:  Total apnea hypopnea index (AHI):   1.0/hour Supine AHI:             0.0/hour Left AHI:             0.0/hour Right AHI:  1.0/hour REM sleep AHI:                   0.0/hour Oxyhemoglobin desaturation nadir:   84% Hypoxic Burden          0:00:35.0 min  PERIODIC LIMB MOVEMENTS:  Periodic limb movement index during sleep (PLMs):  1.7/hour PLM arousal index during sleep:    1.7/hour PLM index during wake time:     0.0/hour  INTERPRETATION: Lights out was at 20:53:46; lights on at 03:31:45. Total recording time (TRT) was 398.0  minutes and total sleep time (TST) was 178.5 minutes. During the overnight study,  the sleep efficiency was reduced / normal at 44.8%. The latency to sleep was normal / reduced / prolonged at 0.0  minutes. WASO (wake after sleep onset) was 216.5 minutes. The arousal index was elevated / normal at 39.0 arousals/ hour, due primarily to respiratory events / spontaneous arousals / periodic limb movements.   Distribution of sleep stages was notable for an increased percentage of stage 1 and stage 2 sleep at 35.3% and 64.1%, respectively, a decreased / normal percentage of slow wave sleep at 0.6%, and a normal / decreased / increased percentage of REM sleep at 0.0%. The latency to REM sleep was prolonged / normal / reduced at - minutes. A reduced REM latency can be seen in narcolepsy, depression, or prior REM deprivation. An excessive amount of WASO (wake after sleep onset) was noted as well as sleep fragmentation. Alpha intrusion in non-REM sleep was noted, a finding which has been associated with non-restorative sleep of varying etiologies.  Periodic limb movements occurred 1.7 times per hour during sleep and resulted in 1.7 arousals per hour. PLMs were noted during WASO (wake after sleep onset) and prior to falling asleep, a finding that has been associated with RLS (restless legs syndrome). There were no clinically significant periodic limb  movements.    There were no clinically significant cardiac arrhythmias noted on the LEAD II EKG, with an average asleep heart rate of 102 bpm (max. 112, min. 79 bpm, respectively).   Video analysis did not demonstrate any unusual behaviors, movements, phonations or vocalizations; EEG analysis did not show any abnormal discharges. Snoring was noted with the patient in the supine and lateral positions. The patient slept mostly in the _ position. Occasional / Frequent obstructive apneas and occasional / frequent hypopneas produced a normal / elevated AHI of 1.0 events per hour in total, with further elevation to 0.0 in REM sleep and 0.0 in the supine position.  There were 2.0 RERAs (respiratory effort related arousals) per hour with a resulting RDI of 3.4/h.  His/Her baseline oxygen saturation was noted to be 94%.  Oxyhemoglobin saturation reached a nadir of 84% during REM / non-REM sleep with a hypoxic burden (time spent below 90% saturation) of 0:00:35.0 minutes. End tidal carbon dioxide peaked at - mm Hg during NREM sleep and - mm Hg during REM sleep. End tidal carbon dioxide was greater than 50 mm Hg for  minutes, which is % of TST. Post-study, the patient indicated, that he / she slept worse / better than / the same as usual.    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 Due to the fact that there was no REM sleep achieved, these numbers are likely an underestimation of her sleep disordered breathing, since most patients have their most frequent and severe apneic/hypopneic events while in supine REM sleep. 2) Please note, that the above numbers may represent an underestimation of the patient's sleep disordered breathing, since no supine sleep was achieved in this study. Most patients with sleep apnea, have  their most frequent and significant apneic or hypopneic events while in supine sleep, particularly in supine REM sleep.  3) This study shows sleep fragmentation and abnormal sleep stage percentages; these are non-specific findings and per se do not signify an intrinsic sleep disorder or a cause for the patient's symptoms of excessive daytime somnolence. Causes include (but are not limited to) the "first night effect" of a sleep study, circadian rhythm disturbances, psychiatric conditions, medication effect, or an underlying medical condition. 4) Due to the very / mild nature / moderate severity of the patient's sleep apnea, avoidance of the supine position and weight loss may constitute sufficient therapy / there are several therapeutic avenues available. Options include avoidance of the supine sleep position along with weight loss / therapy with CPAP is recommended. This will require a repeat study for titration which we will be happy to arrange. Other treatment options include weight loss, upper airway or jaw surgery in selected patients or the use of an oral appliance in certain patients. ENT evaluation and/or consultation with a maxillofacial surgeon may be feasible in some patients./ or CPAP, which would require a repeat study for titration. Alternative and/or adjunctive therapies for loud snoring include the following: a) Dental devices obtained from a dentist or oral surgeon, b) Surgery or upper airway procedure as determined by an otolaryngologist or maxillofacial surgeon, c) Weight loss may be helpful, d) Nasal decongestion can be a helpful adjunctive measure to reduce upper airway resistance, and e) Positional therapy by avoiding sleeping in the supine position may reduce the severity of snoring. 5) Avoidance of the supine position and weight loss will likely aid in reducing the patient's sleep disordered breathing.   6) The side sleep position may aid in reducing the patient's respiratory effort and  snoring related arousals and thus possibly help attaining a more restful sleep.  7) This study does not demonstrate any significant obstructive or central sleep disordered breathing. This study does not support an intrinsic sleep disorder as a cause for the patient's symptoms of excessive daytime somnolence. Other causes, including circadian rhythm disturbances, psychiatric conditions, medication effect and/or behavior problems cannot be ruled out. Additional studies to quantify excessive daytime somnolence (such as a multiple sleep latency test) or circadian rhythm disorder (using actigraphy) may be beneficial. It may be helpful to review of a sleep diary, and re-iteration of  good sleep hygiene measures should be pursued. 8) Moderate periodic limb movements were observed and may require further clinical correlation. Evaluation and potential treatment for periodic limb movements and/or restless legs syndrome (RLS) is recommended, especially, if the patient complains of symptoms of RLS. Treatable conditions resulting in secondary RLS include for example anemia or iron deficiency or uremia. The PLM associated arousal index is mildly elevated. Please note, that this patient taking fluoxetine/sertraline/venlafaxine/citalopram, which is a serotonin reuptake inhibitor. The mechanisms by which serotonergic and tricyclic antidepressants might induce or aggravate PLMs is not fully clear. It may involve the mild dopamine antagonist effect brought about by the antidepressant or a relative imbalance between the serotonin and dopamine/norepinephrine systems. In contrast, the antidepressants bupropion (Wellbutrin) and trazodone (Desyrel) appear to reduce PLMs. Bupropion has been reported to possibly increase levels of dopamine and norepinephrine without increasing serotonin, and there has a been a report of patients with PLMs and depression to have a decreased frequency of PLMs associated with bupropion use. If the sleep  disruption from PLMs is reduced, a reduction in daytime symptoms may be a reasonable expectation. However, if the patient is well maintained on Prozac, there may not be a reason to change antidepressants; he may still benefit from pharmacotherapy of his PLMs.  9) Mild periodic llimb movements were observed and may require further clinical correlation. Evaluation and potential treatment for periodic limb movements and/or restless legs syndrome (RLS) is recommended, if the patient complains of symptoms of RLS. Treatable conditions resulting in secondary RLS include for example anemia or iron deficiency or uremia. Of note, the mere presence of PLMs does not warrant treatment, especially, if the PLM associated arousal index is low, as is the case here. Please note, that this patient's mildly elevated PLM index may be due to medication effect [facility]), which is, in part, a serotonin reuptake inhibitor. 10) Periodic limb movements were observed and may require further clinical correlation following adequate apnea treatment. / Evaluation and potential treatment for periodic limb movements and/or restless legs syndrome is recommended given the patients clinical history and polysomnographic findings. Treatable conditions include for example anemia or iron deficiency or uremia.       Please note, that this patient has a moderately elevated PLM index, despite being on treatment with pramipexole. However, she is taking fluvoxamine, which is a serotonin reuptake inhibitor. The mechanisms by which serotonergic and tricyclic antidepressants might induce or aggravate PLMs is not fully clear. It may involve the mild dopamine antagonist effect brought about by the antidepressant or a relative imbalance between the serotonin and dopamine/norepinephrine systems. In contrast, the antidepressants bupropion (Wellbutrin) and trazodone (Desyrel) appear to reduce PLMs. Bupropion has been reported to possibly increase levels of dopamine  and norepinephrine without increasing serotonin, and there has a been a report of patients with PLMs and depression to have a decreased frequency of PLMs associated with bupropion use. If the sleep disruption from PLMs is reduced, a reduction in daytime symptoms may be a reasonable expectation. 6)  Given the high degree of sleepiness as indicated by the patient, he / she should be cautioned not to drive, work at heights or operate dangerous equipment when tired or sleepy. In general, patients with this / significant degree of daytime sleepiness should be advised not to function in a capacity that requires heightened vigilance or attention. General recommendations for excessive daytime somnolence include maintaining sufficient sleep time and evaluation/improvement of sleep hygiene (for more information, please see www.sleepfoundation.org).  7) Please also note,  that untreated OSA carries additional perioperative morbidity. Patients with significant OSA should receive perioperative CPAP and the surgeons and particularly the anesthesiologists should be informed of the diagnosis and disease severity. The longterm health consequences of untreated mild OSA are not known, however.  8)  The patient will be seen in follow-up / consultation by Dr. Frances Furbish / Dohmeier at [facility] for discussion of his / her study results and further management. Dr. Marland Kitchen was notified of the test results at the time of interpretation via faxed copy of the report.     I certify that I have reviewed the entire raw data recording prior to the issuance of this report in accordance with the Standards of Accreditation of the American Academy of Sleep Medicine (AASM).        Huston Foley, MD, PhD Diplomate, American Board of Psychiatry and Neurology (Neurology and Sleep) [facility]  rev. 04-21-2012         ___________________________________________________   Date Signed:_______________ Melvyn Novas, MD Diplomat,  American Board of Psychiatry and Neurology  Diplomat, American Board of Sleep Medicine Medical Director, [facility] [employer], [facility]

## 2012-05-27 ENCOUNTER — Other Ambulatory Visit: Payer: Self-pay | Admitting: Neurology

## 2012-05-27 DIAGNOSIS — G4761 Periodic limb movement disorder: Secondary | ICD-10-CM

## 2012-05-27 DIAGNOSIS — G4733 Obstructive sleep apnea (adult) (pediatric): Secondary | ICD-10-CM

## 2012-05-27 NOTE — Progress Notes (Signed)
Will get pt started on cpap and see him in consultation.

## 2012-05-31 ENCOUNTER — Encounter: Payer: Self-pay | Admitting: Neurology

## 2012-07-15 ENCOUNTER — Telehealth: Payer: Self-pay

## 2012-07-15 NOTE — Telephone Encounter (Signed)
Pharm requests some RFs on Viagra 100 mg tabs to have on file for pt.

## 2012-07-22 MED ORDER — SILDENAFIL CITRATE 100 MG PO TABS: 100.0000 mg | ORAL_TABLET | Freq: Every day | ORAL | Status: DC | PRN

## 2012-07-22 NOTE — Telephone Encounter (Signed)
OK 3 rfs

## 2012-07-22 NOTE — Telephone Encounter (Signed)
Sent in for him

## 2012-08-01 ENCOUNTER — Ambulatory Visit: Payer: BC Managed Care – PPO

## 2012-08-01 ENCOUNTER — Ambulatory Visit (INDEPENDENT_AMBULATORY_CARE_PROVIDER_SITE_OTHER): Payer: BC Managed Care – PPO | Admitting: Family Medicine

## 2012-08-01 VITALS — BP 124/96 | HR 88 | Temp 97.9°F | Resp 16 | Ht 77.0 in | Wt 279.0 lb

## 2012-08-01 DIAGNOSIS — M79671 Pain in right foot: Secondary | ICD-10-CM

## 2012-08-01 DIAGNOSIS — M79609 Pain in unspecified limb: Secondary | ICD-10-CM

## 2012-08-01 DIAGNOSIS — M779 Enthesopathy, unspecified: Secondary | ICD-10-CM

## 2012-08-01 NOTE — Progress Notes (Signed)
[facility]:  Office Visit  Chief Complaint:  Chief Complaint  Patient presents with  . Foot Pain    right foot x 3 day    HPI: Kelly Johnston is a 42 y.o. male who complains of  Right foot pain started yesterday, NKI. 5/10 dull/sharp pain. No numbness/weakness/tingling. Noprior injuries/surgeries to foot. No gout. No anklepain.   Past Medical History  Diagnosis Date  . Depression   . Chronic kidney disease   . Heart murmur    Past Surgical History  Procedure Laterality Date  . Chest surgery    . Pectus excavatum repair    . Incision and drainage abscess  01/16/2012    Procedure: INCISION AND DRAINAGE ABSCESS;  Surgeon: Wilmon Arms. Corliss Skains, MD;  Location: [facility];  Service: General;  Laterality: Left;   History   Social History  . Marital Status: Married    Spouse Name: N/A    Number of Children: N/A  . Years of Education: N/A   Social History Main Topics  . Smoking status: Former Smoker -- 0.10 packs/day for 2 years    Types: Cigarettes  . Smokeless tobacco: Never Used  . Alcohol Use: No  . Drug Use: No  . Sexually Active: Yes    Birth Control/ Protection: Other-see comments   Other Topics Concern  . None   Social History Narrative  . None   Family History  Problem Relation Age of Onset  . Testicular cancer Father    No Known Allergies Prior to Admission medications   Medication Sig Start Date End Date Taking? Authorizing Provider  liothyronine (CYTOMEL) 25 MCG tablet Take 25 mcg by mouth daily.   Yes Historical Provider, MD  mirtazapine (REMERON) 30 MG tablet Take 30 mg by mouth at bedtime.   Yes Historical Provider, MD  sildenafil (VIAGRA) 100 MG tablet Take 1 tablet (100 mg total) by mouth daily as needed. 07/22/12  Yes Jonita Albee, MD  venlafaxine XR (EFFEXOR-XR) 150 MG 24 hr capsule Take 150 mg by mouth daily.   Yes Historical Provider, MD  docusate sodium 100 MG CAPS Take 100 mg by mouth daily. 01/17/12   Doristine Mango, PA-C   doxycycline (VIBRA-TABS) 100 MG tablet Take 1 tablet (100 mg total) by mouth 2 (two) times daily. 03/07/12   Gwenlyn Found Copland, MD  sulfamethoxazole-trimethoprim (BACTRIM DS) 800-160 MG per tablet Take 1 tablet by mouth 2 (two) times daily. 03/07/12   Gwenlyn Found Copland, MD  verapamil (CALAN) 40 MG tablet Take 40 mg by mouth 3 (three) times daily.    Historical Provider, MD     ROS: The patient denies fevers, chills, night sweats, unintentional weight loss, chest pain, palpitations, wheezing, dyspnea on exertion, nausea, vomiting, abdominal pain, dysuria, hematuria, melena, numbness, weakness, or tingling.   All other systems have been reviewed and were otherwise negative with the exception of those mentioned in the HPI and as above.    PHYSICAL EXAM: Filed Vitals:   08/01/12 0828  BP: 124/96  Pulse: 88  Temp: 97.9 F (36.6 C)  Resp: 16   Filed Vitals:   08/01/12 0828  Height: 6\' 5"  (1.956 m)  Weight: 279 lb (126.554 kg)   Body mass index is 33.08 kg/(m^2).  General: Alert, no acute distress HEENT:  Normocephalic, atraumatic, oropharynx patent.  Cardiovascular:  Regular rate and rhythm, no rubs murmurs or gallops.  No Carotid bruits, radial pulse intact. No pedal edema.  Respiratory: Clear to auscultation bilaterally.  No wheezes, rales, or rhonchi.  No cyanosis, no use of accessory musculature GI: No organomegaly, abdomen is soft and non-tender, positive bowel sounds.  No masses. Skin: No rashes. Neurologic: Facial musculature symmetric. Psychiatric: Patient is appropriate throughout our interaction. Lymphatic: No cervical lymphadenopathy Musculoskeletal: Gait intact. Ankle-nl Right foot-no deformities. + DP, tender along extensor tendons of 4-5th toes. Full ROM, 5/5 strength, senstatoin intact  LABS: Results for orders placed in visit on 03/07/12  POCT CBC      Result Value Range   WBC 14.0 (*) 4.6 - 10.2 K/uL   Lymph, poc 3.4  0.6 - 3.4   POC LYMPH PERCENT 24.1  10 - 50  %L   MID (cbc) 1.1 (*) 0 - 0.9   POC MID % 7.7  0 - 12 %M   POC Granulocyte 9.5 (*) 2 - 6.9   Granulocyte percent 68.2  37 - 80 %G   RBC 5.71  4.69 - 6.13 M/uL   Hemoglobin 16.9  14.1 - 18.1 g/dL   HCT, POC 16.1 (*) 09.6 - 53.7 %   MCV 94.8  80 - 97 fL   MCH, POC 29.6  27 - 31.2 pg   MCHC 31.2 (*) 31.8 - 35.4 g/dL   RDW, POC 04.5     Platelet Count, POC 403  142 - 424 K/uL   MPV 8.2  0 - 99.8 fL     EKG/XRAY:   Primary read interpreted by Dr. Conley Rolls at [facility].   ASSESSMENT/PLAN: Encounter Diagnoses  Name Primary?  . Right foot pain Yes  . Tendonitis    Post op shoe given, pt feels better with it on. Xrays negative for fracture He has IgA nephropathy so will recommend Tylenol only , no NSAIDs RICE F/u prn or 1 week   Julias Mould PHUONG, DO 08/01/2012 9:12 AM

## 2012-08-19 ENCOUNTER — Institutional Professional Consult (permissible substitution): Payer: BC Managed Care – PPO | Admitting: Neurology

## 2012-09-27 ENCOUNTER — Encounter: Payer: Self-pay | Admitting: Neurology

## 2012-10-06 ENCOUNTER — Ambulatory Visit (INDEPENDENT_AMBULATORY_CARE_PROVIDER_SITE_OTHER): Payer: BC Managed Care – PPO | Admitting: Neurology

## 2012-10-06 ENCOUNTER — Encounter: Payer: Self-pay | Admitting: Neurology

## 2012-10-06 VITALS — BP 140/91 | HR 86 | Temp 97.0°F | Ht 79.0 in | Wt 280.0 lb

## 2012-10-06 DIAGNOSIS — Z9989 Dependence on other enabling machines and devices: Secondary | ICD-10-CM

## 2012-10-06 DIAGNOSIS — G4733 Obstructive sleep apnea (adult) (pediatric): Secondary | ICD-10-CM

## 2012-10-06 NOTE — Progress Notes (Signed)
Subjective:    Patient ID: Kelly Johnston is a 42 y.o. male.  HPI  Interim history:   Kelly Johnston is a very friendly 42 year old right-handed gentleman who presents for followup consultation after his recent sleep study and for CPAP checkup. He is unaccompanied today. I first met him on 04/28/2012 at that request of his primary care physician, which time he reported a long-standing history of snoring and daytime somnolence. He has an underlying medical history of depression, anxiety, hypothyroidism and I invited him back for a sleep study. He had a split-night sleep study on 05/13/2012 and I went over his test results with him in detail today. His baseline sleep efficiency was 75% with a latency to sleep of 6 minutes. Of note, the patient was noted to be nauseated upon arrival and vomited before the test started as well as in the morning. He felt that he had had a stomach bug. He had a markedly increased percentage of stage I sleep and absence of REM sleep prior to CPAP. He had mild to moderate snoring. He had a total of 46 obstructive apneas as well as 101 obstructive hypopneas, rendering in overall AHI of 74.5 per hour. His baseline oxygen saturation was 91% and his nadir was 85%. He was then titrated on CPAP from 5-8 cm of water pressure and had complete resolution of his sleep disordered breathing on 7 cm of pressure. He was noted to have periodic leg movements of sleep. His PLMS for severe post CPAP at 69.8 per hour with an associated arousal index of 8.2 per hour. He was since then prescribed CPAP. I reviewed compliance data with him as well. This was from 06/11/1998 1420 07/09/2012 which is a total of 30 days during which time he uses CPAP every day except for one day. His percent used days greater than 4 hours was 90%, indicating good compliance. His residual AHI was 3.1, indicating an adequate treatment pressure of 7 with EPR of 2. His average usage was 6 hours and 7 minutes for all days. He reports  doing well with CPAP. He has adjusted to the pressure well. He has no problems tolerating the nasal pillows mouse. He feels better overall. He has no new complaints.     His Past Medical History Is Significant For: Past Medical History  Diagnosis Date  . Depression   . Chronic kidney disease   . Heart murmur     His Past Surgical History Is Significant For: Past Surgical History  Procedure Laterality Date  . Chest surgery    . Pectus excavatum repair    . Incision and drainage abscess  01/16/2012    Procedure: INCISION AND DRAINAGE ABSCESS;  Surgeon: Wilmon Arms. Corliss Skains, MD;  Location: [facility];  Service: General;  Laterality: Left;    His Family History Is Significant For: Family History  Problem Relation Age of Onset  . Testicular cancer Father     His Social History Is Significant For: History   Social History  . Marital Status: Married    Spouse Name: N/A    Number of Children: N/A  . Years of Education: N/A   Social History Main Topics  . Smoking status: Former Smoker -- 0.10 packs/day for 2 years    Types: Cigarettes  . Smokeless tobacco: Never Used  . Alcohol Use: No  . Drug Use: No  . Sexually Active: Yes    Birth Control/ Protection: Other-see comments   Other Topics Concern  . None  Social History Narrative  . None    His Allergies Are:  No Known Allergies:   His Current Medications Are:  Outpatient Encounter Prescriptions as of 10/06/2012  Medication Sig Dispense Refill  . liothyronine (CYTOMEL) 25 MCG tablet Take 25 mcg by mouth daily.      . mirtazapine (REMERON) 30 MG tablet Take 30 mg by mouth at bedtime.      . sildenafil (VIAGRA) 100 MG tablet Take 1 tablet (100 mg total) by mouth daily as needed.  10 tablet  3  . venlafaxine XR (EFFEXOR-XR) 150 MG 24 hr capsule Take 150 mg by mouth daily.      . [DISCONTINUED] docusate sodium 100 MG CAPS Take 100 mg by mouth daily.  10 capsule    . [DISCONTINUED] doxycycline (VIBRA-TABS) 100 MG tablet Take 1  tablet (100 mg total) by mouth 2 (two) times daily.  20 tablet  0  . [DISCONTINUED] sulfamethoxazole-trimethoprim (BACTRIM DS) 800-160 MG per tablet Take 1 tablet by mouth 2 (two) times daily.  20 tablet  0  . [DISCONTINUED] verapamil (CALAN) 40 MG tablet Take 40 mg by mouth 3 (three) times daily.       No facility-administered encounter medications on file as of 10/06/2012.   Review of Systems  All other systems reviewed and are negative.    Objective:  Neurologic Exam  Physical Exam Physical Examination:   Filed Vitals:   10/06/12 0922  BP: 140/91  Pulse: 86  Temp: 97 F (36.1 C)    General Examination: The patient is a very pleasant 42 y.o. male in no acute distress. He appears well-developed and well-nourished and well groomed.  HEENT exam: Normocephalic, atraumatic, pupils are equal, round and reactive to light and accommodation. Extraocular tracking is good without nystagmus. Hearing is intact. Face is symmetric with normal facial animation noted. Speech is clear. He has on airway examination a Mallampati class III. He has a narrow appearing airway with a thickened soft palate and tonsillar size of 2+ overall airway tightness seems to be moderate to severe. Tongue is normal in size and uvula is normal in size as well. Mucosal membranes are moist.  Chest clear to auscultation without wheezing, crackles or rhonchi. Heart sounds are normal without murmurs, rubs or gallops noted. Abdomen is soft, mildly protuberant, nontender with normal bowel sounds. He has no pitting edema in the distal lower extremities bilaterally. Skin is warm and dry. He has no trophic changes and no obvious joint deformities. Neurologically: Mental status: The patient is awake, alert and oriented in all 4 spheres. His memory, attention, language and knowledge are appropriate. Cranial nerves II through XII are as described above. Motor exam reveals normal bulk, strength and tone. He has no drift or tremor. Romberg  is negative. Reflexes are 2+ throughout. Sensory exam is intact to light touch. Gait, station and balance are normal, and intact stance is noted.  Of note, was most of her visit in reviewing test results and counseling and coordination of care.  Assessment and Plan:    In summary, Kelly Johnston is a very pleasant 42 y.o.-year old male with a history of anxiety and depression and a recent diagnosis of OSA, now on CPAP treatment at a pressure of 7 cwp. His physical exam is stable and He indicates good results with the use of CPAP, and good tolerance of the pressure and mask. I reviewed the compliance data with the patient and encouraged him to continue to use CPAP regularly to help  reduce cardiovascular risk.  I congratulated him on his good CPAP compliance.  since she is stable I would like to see him back in 6 months from now, sooner if the need arises. If he continues to do well and can see him on a yearly basis thereafter. He was in agreement.

## 2012-10-06 NOTE — Patient Instructions (Addendum)
Please continue using your CPAP regularly. While your insurance requires that you use CPAP at least 4 hours each night on 70% of the nights, I recommend, that you not skip any nights and use it throughout the night if you can. Getting used to CPAP does take time and patience and discipline. Untreated obstructive sleep apnea when it is moderate to severe can have an adverse impact on cardiovascular health and raise her risk for heart disease, arrhythmias, hypertension, congestive heart failure, stroke and diabetes. Untreated obstructive sleep apnea causes sleep disruption, nonrestorative sleep, and sleep deprivation. This can have an impact on your day to day functioning and cause daytime sleepiness and impairment of cognitive function, memory loss, mood disturbance, and problems focussing. Using CPAP regularly can improve these symptoms.  Please establish care with a PCP.

## 2012-10-25 ENCOUNTER — Ambulatory Visit: Payer: BC Managed Care – PPO

## 2012-10-25 ENCOUNTER — Ambulatory Visit (INDEPENDENT_AMBULATORY_CARE_PROVIDER_SITE_OTHER): Payer: BC Managed Care – PPO | Admitting: Emergency Medicine

## 2012-10-25 VITALS — BP 118/82 | HR 106 | Temp 97.7°F | Resp 18

## 2012-10-25 DIAGNOSIS — S8011XA Contusion of right lower leg, initial encounter: Secondary | ICD-10-CM

## 2012-10-25 DIAGNOSIS — S8010XA Contusion of unspecified lower leg, initial encounter: Secondary | ICD-10-CM

## 2012-10-25 DIAGNOSIS — M79609 Pain in unspecified limb: Secondary | ICD-10-CM

## 2012-10-25 MED ORDER — OXYCODONE-ACETAMINOPHEN 5-325 MG PO TABS: 1.0000 | ORAL_TABLET | Freq: Three times a day (TID) | ORAL | Status: DC | PRN

## 2012-10-25 NOTE — Progress Notes (Signed)
  Subjective:    Patient ID: Kelly Johnston, male    DOB: [birth year], 42 y.o.   MRN: [id number]  HPI patient was in her usual state of health until earlier today when while working on tearing down a fence he struck one of the fence post with his leg and a portion of the wood kicked back and struck him in his medial right lower leg.Marland Kitchen He continued to stay upright and over that period of time developed significant swelling over the calf to where he was having severe pain feeling faint and noticed the significant swelling. He presents now for evaluation    Review of Systems     Objective:   Physical Exam there is a proximate 6 x 6" hematoma over the media  portion of the right lower leg. This is directly over the greater saphenous drainage system. There is no posterior calf tenderness. Sensation in the toes is normal. dorsalis pedis and posterior tibial pulses are 2+.  [facility] reading (PRIMARY) by  Dr.Diquan Kassis no fracture is seen        Assessment & Plan:  We'll recheck tomorrow. We'll treat with ice and elevation. He was given a prescription for pain medication. He was given instructions regarding decreased circulation or decreased sensation to his legs and to present for reevaluation if this developed

## 2012-10-26 ENCOUNTER — Telehealth: Payer: Self-pay | Admitting: *Deleted

## 2012-10-26 NOTE — Telephone Encounter (Signed)
lmom to cb with status since pt has not shown up for recheck

## 2012-10-27 ENCOUNTER — Telehealth: Payer: Self-pay | Admitting: Family Medicine

## 2012-10-27 NOTE — Telephone Encounter (Signed)
Left message to return call. Per Dr. Cleta Alberts radiologist suggested we get 2 more films to get the lower 1/3 of the tibia. He was supposed to follow up yesterday but didn't. He can come here or follow up with Dr. Althea Charon to get those views. No fracture was seen on XR.

## 2012-10-29 ENCOUNTER — Encounter: Payer: Self-pay | Admitting: Neurology

## 2012-10-29 NOTE — Progress Notes (Signed)
Quick Note:  I reviewed the patient's CPAP compliance data from 07/10/12 to 08/08/12, which is a total of 30 days, during which time the patient used CPAP every day except for 7 days. The average usage for all days was 4 hours and 9 minutes. The percent used days greater than 4 hours was 67%, indicating fair compliance. The residual AHI was 6.3 per hour, indicating a suboptimal treatment pressure of 7 cwp w EPR of 2. I will review this data with the patient at the next visit, provide feedback and additional trouble shooting if need be.  Huston Foley, MD, PhD [facility] ([facility]   ______

## 2012-10-30 ENCOUNTER — Encounter: Payer: Self-pay | Admitting: Neurology

## 2012-10-30 NOTE — Telephone Encounter (Signed)
Called patient. He is advised of the xray report. He is feeling much better. He will call me back, if he has any additional problems.

## 2013-02-02 ENCOUNTER — Other Ambulatory Visit: Payer: Self-pay | Admitting: Internal Medicine

## 2013-02-07 ENCOUNTER — Ambulatory Visit (INDEPENDENT_AMBULATORY_CARE_PROVIDER_SITE_OTHER): Payer: BC Managed Care – PPO | Admitting: Emergency Medicine

## 2013-02-07 VITALS — BP 124/76 | HR 120 | Temp 98.4°F | Resp 16 | Ht 77.0 in | Wt 274.0 lb

## 2013-02-07 DIAGNOSIS — N529 Male erectile dysfunction, unspecified: Secondary | ICD-10-CM

## 2013-02-07 MED ORDER — SILDENAFIL CITRATE 100 MG PO TABS: 100.0000 mg | ORAL_TABLET | ORAL | Status: DC | PRN

## 2013-02-07 NOTE — Addendum Note (Signed)
Addended by: Carmelina Dane on: 02/07/2013 10:58 AM   Modules accepted: Orders

## 2013-02-07 NOTE — Patient Instructions (Signed)
Erectile Dysfunction  Erectile dysfunction is the inability to get or sustain a good enough erection to have sexual intercourse. Erectile dysfunction may involve:   Inability to get an erection.   Lack of enough hardness to allow penetration.   Loss of the erection before sex is finished.   Premature ejaculation.  CAUSES   Certain drugs, such as:   Pain relievers.   Antihistamines.   Antidepressants.   Blood pressure medicines.   Water pills (diuretics).   Ulcer medicines.   Muscle relaxants.   Illegal drugs.   Excessive drinking.   Psychological causes, such as:   Anxiety.   Depression.   Sadness.   Exhaustion.   Performance fear.   Stress.   Physical causes, such as:   Artery problems. This may include diabetes, smoking, liver disease, or atherosclerosis.   High blood pressure.   Hormonal problems, such as low testosterone.   Obesity.   Nerve problems. This may include back or pelvic injuries, diabetes mellitus, multiple sclerosis, or Parkinson disease.  SYMPTOMS   Inability to get an erection.   Lack of enough hardness to allow penetration.   Loss of the erection before sex is finished.   Premature ejaculation.   Normal erections at some times, but with frequent unsatisfactory episodes.   Orgasms that are not satisfactory in sensation or frequency.   Low sexual satisfaction in either partner because of erection problems.   A curved penis occurring with erection. The curve may cause pain or may be too curved to allow for intercourse.   Never having nighttime erections.  DIAGNOSIS  Your caregiver can often diagnose this condition by:   Performing a physical exam to find other diseases or specific problems with the penis.   Asking you detailed questions about the problem.   Performing blood tests to check for diabetes mellitus or to measure hormone levels.   Performing urine tests to find other underlying health conditions.   Performing an ultrasound exam to check for  scarring.   Performing a test to check blood flow to the penis.   Doing a sleep study at home to measure nighttime erections.  TREATMENT    You may be prescribed medicines by mouth.   You may be given medicine injections into the penis.   You may be prescribed a vacuum pump with a ring.   Penile implant surgery may be performed. You may receive:   An inflatable implant.   A semirigid implant.   Blood vessel surgery may be performed.  HOME CARE INSTRUCTIONS   If you are prescribed oral medicine, you should take the medicine as prescribed. Do not increase the dosage without first discussing it with your physician.   If you are using self-injections, be careful to avoid any veins that are on the surface of the penis. Apply pressure to the injection site for 5 minutes.   If you are using a vacuum pump, make sure you have read the instructions before using it. Discuss any questions with your physician before taking the pump home.  SEEK MEDICAL CARE IF:   You experience pain that is not responsive to the pain medicine you have been prescribed.   You experience nausea or vomiting.  SEEK IMMEDIATE MEDICAL CARE IF:    When taking oral or injectable medications, you experience an erection that lasts longer than 4 hours. If your physician is unavailable, go to the nearest emergency room for evaluation. An erection that lasts much longer than 4 hours can   result in permanent damage to your penis.   You have pain that is severe.   You develop redness, severe pain, or severe swelling of your penis.   You have redness spreading up into your groin or lower abdomen.   You are unable to pass your urine.  Document Released: 02/10/2000 Document Revised: 10/15/2012 Document Reviewed: 07/17/2012  [employer] Patient Information 2014 [employer].

## 2013-02-07 NOTE — Progress Notes (Signed)
[facility] [address], [place] [address] [phone]  Date:  02/07/2013   Name:  Kelly Johnston   DOB:  [birth year]   MRN:  [id number]  PCP:  Provider Not In System    Chief Complaint: Medication Refill   History of Present Illness:  Kelly Johnston is a 42 y.o. very pleasant male patient who presents with the following:  History of ED and using viagra.  Never had testosterone check.  Is experiencing no adverse effect of the drug.  No improvement with over the counter medications or other home remedies. Denies other complaint or health concern today.   Patient Active Problem List   Diagnosis Date Noted  . Obesity (BMI 30.0-34.9) 03/17/2012  . Sleep disturbance 03/17/2012  . Depression 01/16/2012  . Abscess of left thigh 01/13/2012  . IgA nephropathy 01/10/2012    Past Medical History  Diagnosis Date  . Depression   . Chronic kidney disease   . Heart murmur     Past Surgical History  Procedure Laterality Date  . Chest surgery    . Pectus excavatum repair    . Incision and drainage abscess  01/16/2012    Procedure: INCISION AND DRAINAGE ABSCESS;  Surgeon: Wilmon Arms. Corliss Skains, MD;  Location: [facility];  Service: General;  Laterality: Left;    History  Substance Use Topics  . Smoking status: Former Smoker -- 0.10 packs/day for 2 years    Types: Cigarettes  . Smokeless tobacco: Never Used  . Alcohol Use: No    Family History  Problem Relation Age of Onset  . Testicular cancer Father     No Known Allergies  Medication list has been reviewed and updated.  Current Outpatient Prescriptions on File Prior to Visit  Medication Sig Dispense Refill  . liothyronine (CYTOMEL) 25 MCG tablet Take 25 mcg by mouth daily.      . mirtazapine (REMERON) 30 MG tablet Take 30 mg by mouth at bedtime.      Marland Kitchen oxyCODONE-acetaminophen (ROXICET) 5-325 MG per tablet Take 1 tablet by mouth every 8 (eight) hours as needed for pain.  20 tablet  0  . sildenafil  (VIAGRA) 100 MG tablet Take 1 tablet (100 mg total) by mouth daily as needed.  10 tablet  3  . venlafaxine XR (EFFEXOR-XR) 150 MG 24 hr capsule Take 150 mg by mouth daily.      Marland Kitchen VIAGRA 100 MG tablet TAKE 1 TABLET (100 MG TOTAL) BY MOUTH DAILY AS NEEDED FOR ERECTILE DYSFUNCTION.  10 tablet  2   No current facility-administered medications on file prior to visit.    Review of Systems:  As per HPI, otherwise negative.    Physical Examination: Filed Vitals:   02/07/13 1003  BP: 124/76  Pulse: 120  Temp: 98.4 F (36.9 C)  Resp: 16   Filed Vitals:   02/07/13 1003  Height: 6\' 5"  (1.956 m)  Weight: 274 lb (124.286 kg)   Body mass index is 32.49 kg/(m^2). Ideal Body Weight: Weight in (lb) to have BMI = 25: 210.4   GEN: WDWN, NAD, Non-toxic, Alert & Oriented x 3 HEENT: Atraumatic, Normocephalic.  Ears and Nose: No external deformity. EXTR: No clubbing/cyanosis/edema NEURO: Normal gait.  PSYCH: Normally interactive. Conversant. Not depressed or anxious appearing.  Calm demeanor.    Assessment and Plan: ED Testosterone level Future labs   Signed,  Phillips Odor, MD

## 2013-02-08 ENCOUNTER — Other Ambulatory Visit: Payer: Self-pay | Admitting: Emergency Medicine

## 2013-02-08 MED ORDER — TESTOSTERONE 40.5 MG/2.5GM (1.62%) TD GEL: TRANSDERMAL | Status: DC

## 2013-04-08 ENCOUNTER — Encounter (INDEPENDENT_AMBULATORY_CARE_PROVIDER_SITE_OTHER): Payer: Self-pay

## 2013-04-08 ENCOUNTER — Encounter: Payer: Self-pay | Admitting: Neurology

## 2013-04-08 ENCOUNTER — Ambulatory Visit (INDEPENDENT_AMBULATORY_CARE_PROVIDER_SITE_OTHER): Payer: BC Managed Care – PPO | Admitting: Neurology

## 2013-04-08 VITALS — BP 148/96 | HR 79 | Temp 98.1°F | Ht 79.0 in | Wt 270.0 lb

## 2013-04-08 DIAGNOSIS — I10 Essential (primary) hypertension: Secondary | ICD-10-CM

## 2013-04-08 DIAGNOSIS — Z9989 Dependence on other enabling machines and devices: Secondary | ICD-10-CM

## 2013-04-08 DIAGNOSIS — G4733 Obstructive sleep apnea (adult) (pediatric): Secondary | ICD-10-CM

## 2013-04-08 NOTE — Progress Notes (Signed)
Subjective:    Johnston ID: Kelly Johnston is a 43 y.o. male.  HPI    Interim history:   Kelly Johnston is a very friendly 43 year old right-handed Kelly Johnston with an underlying medical history of depression, anxiety, and hypothyroidism, who presents for followup consultation of his severe OSA, now on treatment. Kelly Johnston is accompanied by his daughter today. Kelly Johnston last saw Kelly Johnston on 10/06/2012, at which time we discussed a sleep study findings and his compliance. Kelly Johnston was doing well with CPAP and was compliant with treatment. Kelly Johnston suggested a six-month followup and yearly thereafter if Kelly Johnston continues to do well.  Today, Kelly Johnston reviewed Kelly compliance data we have available from 07/10/2012 through 08/08/2012 and during this 30 day window Kelly Johnston use CPAP 23 days. Percent used days greater than 4 hours was 67% which indicates suboptimal compliance. Average usage for all days was 4 hours and 9 minutes. Kelly Johnston is residual AHI was 6.3 with acceptable leak. Pressure was 7 cm with CPR of 2. I do not have Kelly most recent compliance data available for review and we have requested that for today. Today, Kelly Johnston reports that Kelly Johnston is mostly compliant with CPAP but sometimes Kelly Johnston gets a cold or congestion and cannot use it. Kelly Johnston recently had a cold and does sound congested today. Kelly Johnston has no new complaints. Kelly Johnston used to be on blood pressure medication but is currently not on anything for blood pressure. Overall, Kelly Johnston has done well with CPAP and reports improvement of his sleep with Kelly use of CPAP.  Kelly Johnston first met Kelly Johnston on 04/28/2012 at Kelly request of his PCP. Kelly Johnston reported a long-standing history of snoring and EDS at Kelly time. Kelly Johnston had a split-night sleep study on 05/13/2012: His baseline sleep efficiency was 75% with a latency to sleep of 6 minutes. Of note, Kelly Johnston was noted to be nauseated upon arrival and vomited before Kelly test started as well as in Kelly morning. Kelly Johnston felt that Kelly Johnston had had a stomach bug. Kelly Johnston had a markedly increased percentage of stage Kelly Johnston sleep and absence  of REM sleep prior to CPAP. Kelly Johnston had mild to moderate snoring. Kelly Johnston had a total of 46 obstructive apneas as well as 101 obstructive hypopneas, rendering in overall AHI of 74.5 per hour. His baseline oxygen saturation was 91% and his nadir was 85%. Kelly Johnston was then titrated on CPAP from 5-8 cm of water pressure and had complete resolution of his sleep disordered breathing on 7 cm of pressure. Kelly Johnston was noted to have periodic leg movements of sleep. His PLMS were severe post CPAP at 69.8 per hour with an associated arousal index of 8.2 per hour. Kelly Johnston was started on CPAP. 30 day compliance data from 06/10/2012 to 07/09/2012 showed: daily usage, except for one day. His percent used days greater than 4 hours was 90%, indicating great compliance. His residual AHI was 3.1, indicating an adequate treatment pressure of 7 with EPR of 2. His average usage was 6 hours and 7 minutes for all days.   His Past Medical History Is Significant For: Past Medical History  Diagnosis Date  . Depression   . Chronic kidney disease   . Heart murmur     His Past Surgical History Is Significant For: Past Surgical History  Procedure Laterality Date  . Chest surgery    . Pectus excavatum repair    . Incision and drainage abscess  01/16/2012    Procedure: INCISION AND DRAINAGE ABSCESS;  Surgeon: Imogene Burn. Tsuei, MD;  Location: [facility];  Service: General;  Laterality: Left;    His Family History Is Significant For: Family History  Problem Relation Age of Onset  . Testicular cancer Father     His Social History Is Significant For: History   Social History  . Marital Status: Married    Spouse Name: N/A    Number of Children: N/A  . Years of Education: N/A   Social History Main Topics  . Smoking status: Former Smoker -- 0.10 packs/day for 2 years    Types: Cigarettes  . Smokeless tobacco: Never Used  . Alcohol Use: No  . Drug Use: No  . Sexual Activity: Yes    Birth Control/ Protection: Other-see comments   Other Topics  Concern  . None   Social History Narrative  . None    His Allergies Are:  No Known Allergies:   His Current Medications Are:  Outpatient Encounter Prescriptions as of 04/08/2013  Medication Sig  . liothyronine (CYTOMEL) 25 MCG tablet Take 25 mcg by mouth daily.  . mirtazapine (REMERON) 30 MG tablet Take 30 mg by mouth at bedtime.  Marland Kitchen oxyCODONE-acetaminophen (ROXICET) 5-325 MG per tablet Take 1 tablet by mouth every 8 (eight) hours as needed for pain.  . sildenafil (VIAGRA) 100 MG tablet Take 1 tablet (100 mg total) by mouth daily as needed.  . sildenafil (VIAGRA) 100 MG tablet Take 1 tablet (100 mg total) by mouth as needed for erectile dysfunction.  . Testosterone (ANDROGEL) 40.5 MG/2.5GM (1.62%) GEL Four pumps daily applied to chest and shoulders.  . venlafaxine XR (EFFEXOR-XR) 150 MG 24 hr capsule Take 150 mg by mouth daily.  :  Review of Systems:  Out of a complete 14 point review of systems, all are reviewed and negative with Kelly exception of these symptoms as listed below:   Review of Systems  Constitutional: Negative.   HENT: Negative.   Eyes: Negative.   Respiratory: Negative.   Cardiovascular: Negative.   Gastrointestinal: Negative.   Endocrine: Negative.   Genitourinary: Negative.   Musculoskeletal: Negative.   Skin: Negative.   Allergic/Immunologic: Negative.   Neurological: Negative.   Hematological: Negative.   Psychiatric/Behavioral: Negative.   All other systems reviewed and are negative.    Objective:  Neurologic Exam  Physical Exam Physical Examination:   Filed Vitals:   04/08/13 1124  BP: 148/96  Pulse: 79  Temp: 98.1 F (36.7 C)    General Examination: Kelly Johnston is a very pleasant 43 y.o. male in no acute distress. Kelly Johnston appears well-developed and well-nourished and well groomed.  HEENT exam: Normocephalic, atraumatic, pupils are equal, round and reactive to light and accommodation. Extraocular tracking is good without nystagmus. Hearing is  intact. Face is symmetric with normal facial animation noted. Speech is clear. Kelly Johnston has on airway examination a Mallampati class III. Kelly Johnston has a narrow appearing airway with a thickened soft palate and tonsillar size of 2+ overall airway tightness seems to be moderate to severe. Tongue is normal in size and uvula is normal in size as well. Mucosal membranes are moist. Kelly Johnston has a nasal sounding voice and has mild clear nasal discharge. Chest clear to auscultation without wheezing, crackles or rhonchi. Heart sounds are normal without murmurs, rubs or gallops noted. Abdomen is soft, mildly protuberant, nontender with normal bowel sounds. Kelly Johnston has no pitting edema in Kelly distal lower extremities bilaterally. Skin is warm and dry. Kelly Johnston has no trophic changes and no obvious joint deformities. Neurologically: Mental status: Kelly Johnston is awake, alert and oriented  in all 4 spheres. His memory, attention, language and knowledge are appropriate. Cranial nerves II through XII are as described above. Motor exam reveals normal bulk, strength and tone. Kelly Johnston has no drift or tremor. Romberg is negative. Reflexes are 2+ throughout. Sensory exam is intact to light touch. Gait, station and balance are normal, and intact stance is noted.   Assessment and Plan:   In summary, IAM LIPSON is a very pleasant 43 year old male with a history of anxiety and depression and a diagnosis of severe OSA, on CPAP treatment at a pressure of 7 cwp. Kelly Johnston has previously been very compliant with treatment and has some suboptimal compliance in June of last year, however Kelly Johnston have requested Kelly most recent compliance data from his DME company. Also Kelly Johnston has not had any recent supplies. Kelly Johnston have requested and ordered new CPAP supplies. His physical exam is stable and Kelly Johnston indicates good results with Kelly use of CPAP, and good tolerance of Kelly pressure and mask. Kelly Johnston will review Kelly most recent compliance data once it is available and Kelly Johnston encouraged Kelly Johnston to continue to use CPAP  regularly to help reduce cardiovascular risk.  Kelly Johnston congratulated Kelly Johnston on his good CPAP compliance. Kelly Johnston is advised to not skip any days if possible. Kelly Johnston would like to see Kelly Johnston back in a years time since Kelly Johnston has been doing well overall. Kelly Johnston was in agreement.

## 2013-05-21 ENCOUNTER — Encounter: Payer: Self-pay | Admitting: Neurology

## 2013-05-22 NOTE — Progress Notes (Signed)
Quick Note:  I reviewed the patient's CPAP compliance data from 04/09/2013 to 05/18/2013, which is a total of 40 days, during which time the patient used CPAP only 29 days. The average usage for all days was 4 hours and 24 minutes. The percent used days greater than 4 hours was 65 %, indicating suboptimal compliance. The residual AHI was 1.3 per hour, indicating an appropriate treatment pressure of 7 cwp with EPR of 2. I will review this data with the patient at the next office visit, provide feedback and additional troubleshooting if need be. Currently he is routinely scheduled for 04/08/2014 at noon. We will get in touch with the patient regarding his lapses in compliance. He will be encouraged not to skip any days. If he starts using CPAP every night he will fulfill all compliance criteria. Leak was very low most of the time.  Huston FoleySaima Shahab Polhamus, MD, PhD [facility] ([facility]   ______

## 2013-05-26 ENCOUNTER — Encounter: Payer: Self-pay | Admitting: Neurology

## 2013-08-05 ENCOUNTER — Other Ambulatory Visit: Payer: Self-pay

## 2013-08-05 ENCOUNTER — Other Ambulatory Visit: Payer: Self-pay | Admitting: Internal Medicine

## 2013-08-05 MED ORDER — TESTOSTERONE 40.5 MG/2.5GM (1.62%) TD GEL: TRANSDERMAL | Status: DC

## 2013-08-05 NOTE — Telephone Encounter (Signed)
Dr Dareen Piano, pharm requests RF of Androgel. Pt is due for f/up this month and I have pended 1 RF w/note that pt needs OV for your review.

## 2013-08-05 NOTE — Telephone Encounter (Signed)
Faxed Rx to [facility].

## 2013-08-06 ENCOUNTER — Telehealth: Payer: Self-pay

## 2013-08-06 NOTE — Telephone Encounter (Signed)
Pharm faxed request for Viagra RF. Pt is due for f/up. Pt should have RFs on file from 02/07/13 Rx. Sent back fax w/note.

## 2013-11-03 ENCOUNTER — Ambulatory Visit (INDEPENDENT_AMBULATORY_CARE_PROVIDER_SITE_OTHER): Payer: BC Managed Care – PPO | Admitting: Internal Medicine

## 2013-11-03 VITALS — BP 126/72 | HR 117 | Temp 97.5°F | Resp 22 | Ht 76.0 in | Wt 278.2 lb

## 2013-11-03 DIAGNOSIS — R197 Diarrhea, unspecified: Secondary | ICD-10-CM

## 2013-11-03 DIAGNOSIS — R1115 Cyclical vomiting syndrome unrelated to migraine: Secondary | ICD-10-CM

## 2013-11-03 LAB — POCT CBC
GRANULOCYTE PERCENT: 69.4 % (ref 37–80)
HEMATOCRIT: 61.2 % — AB (ref 43.5–53.7)
HEMOGLOBIN: 19.8 g/dL — AB (ref 14.1–18.1)
Lymph, poc: 1.9 (ref 0.6–3.4)
MCH, POC: 29.4 pg (ref 27–31.2)
MCHC: 32.4 g/dL (ref 31.8–35.4)
MCV: 90.6 fL (ref 80–97)
MID (cbc): 0.6 (ref 0–0.9)
MPV: 7.4 fL (ref 0–99.8)
POC Granulocyte: 5.6 (ref 2–6.9)
POC LYMPH %: 23.7 % (ref 10–50)
POC MID %: 6.9 %M (ref 0–12)
Platelet Count, POC: 311 10*3/uL (ref 142–424)
RBC: 6.75 M/uL — AB (ref 4.69–6.13)
RDW, POC: 13.6 %
WBC: 8.1 10*3/uL (ref 4.6–10.2)

## 2013-11-03 LAB — COMPLETE METABOLIC PANEL WITH GFR
ALBUMIN: 4.4 g/dL (ref 3.5–5.2)
ALT: 27 U/L (ref 0–53)
AST: 16 U/L (ref 0–37)
Alkaline Phosphatase: 101 U/L (ref 39–117)
BUN: 28 mg/dL — AB (ref 6–23)
CALCIUM: 8.7 mg/dL (ref 8.4–10.5)
CHLORIDE: 98 meq/L (ref 96–112)
CO2: 23 meq/L (ref 19–32)
Creat: 1.82 mg/dL — ABNORMAL HIGH (ref 0.50–1.35)
GFR, EST NON AFRICAN AMERICAN: 44 mL/min — AB
GFR, Est African American: 51 mL/min — ABNORMAL LOW
GLUCOSE: 105 mg/dL — AB (ref 70–99)
POTASSIUM: 4.1 meq/L (ref 3.5–5.3)
SODIUM: 134 meq/L — AB (ref 135–145)
TOTAL PROTEIN: 7.3 g/dL (ref 6.0–8.3)
Total Bilirubin: 1 mg/dL (ref 0.2–1.2)

## 2013-11-03 MED ORDER — ONDANSETRON HCL 8 MG PO TABS: 8.0000 mg | ORAL_TABLET | Freq: Three times a day (TID) | ORAL | Status: DC | PRN

## 2013-11-03 MED ORDER — ONDANSETRON 4 MG PO TBDP
4.0000 mg | ORAL_TABLET | Freq: Once | ORAL | Status: AC
  Administered 2013-11-03: 4 mg via ORAL

## 2013-11-03 MED ORDER — DIPHENOXYLATE-ATROPINE 2.5-0.025 MG PO TABS: 1.0000 | ORAL_TABLET | Freq: Four times a day (QID) | ORAL | Status: DC | PRN

## 2013-11-03 NOTE — Progress Notes (Signed)
Subjective:    Patient ID: Kelly Johnston, male    DOB: August 29, 1970, 43 y.o.   MRN: [id number]  Emesis  Pertinent negatives include no arthralgias, chest pain or coughing.  Diarrhea  Pertinent negatives include no arthralgias or coughing.  Abdominal Cramping Pertinent negatives include no arthralgias.    Chief Complaint  Patient presents with  . Emesis    x 3 days  . Diarrhea  . Abdominal Cramping  . Weight Loss    x 15lbs in 3 days   Abrupt onset of nausea vomiting and diarrhea 3 days ago Last vomitus was yesterday Last diarrhea a few minutes ago Able to tolerate fluids today Fever on day 1 but none today No abdominal pain No intake of food 3 days with resulting weight loss/ unsure of exposure to people or food No recent antibiotics  Review of Systems  HENT: Negative for sneezing, sore throat and trouble swallowing.   Eyes: Negative for discharge.  Respiratory: Negative for cough and shortness of breath.   Cardiovascular: Negative for chest pain and palpitations.  Gastrointestinal: Negative for blood in stool.  Genitourinary: Negative for difficulty urinating.  Musculoskeletal: Negative for arthralgias.  Skin: Negative for rash.       Objective:   Physical Exam BP 126/72  Pulse 117  Temp(Src) 97.5 F (36.4 C) (Oral)  Resp 22  Ht  (1.93 m)  Wt 278 lb 3.2 oz (126.191 kg)  BMI 33.88 kg/m2  SpO2 99% No acute distress HEENT clear--- oropharynx moist Heart regular with rate of 100 no murmur Lungs clear Abdomen benign without masses organomegaly Extremities clear Neurological intact  8 mg Zofran/fluids tolerated Results for orders placed in visit on 11/03/13  POCT CBC      Result Value Ref Range   WBC 8.1  4.6 - 10.2 K/uL   Lymph, poc 1.9  0.6 - 3.4   POC LYMPH PERCENT 23.7  10 - 50 %L   MID (cbc) 0.6  0 - 0.9   POC MID % 6.9  0 - 12 %M   POC Granulocyte 5.6  2 - 6.9   Granulocyte percent 69.4  37 - 80 %G   RBC 6.75 (*) 4.69 - 6.13 M/uL   Hemoglobin 19.8 (*) 14.1 - 18.1 g/dL   HCT, POC 40.9 (*) 81.1 - 53.7 %   MCV 90.6  80 - 97 fL   MCH, POC 29.4  27 - 31.2 pg   MCHC 32.4  31.8 - 35.4 g/dL   RDW, POC 91.4     Platelet Count, POC 311  142 - 424 K/uL   MPV 7.4  0 - 99.8 fL        Assessment & Plan:   vomiting with nausea likely infection Diarrhea -assoc  zofran---push fluids Lomotil 2-3 days Ck cmet   Adden: Results for orders placed in visit on 11/03/13  COMPLETE METABOLIC PANEL WITH GFR      Result Value Ref Range   Sodium 134 (*) 135 - 145 mEq/L   Potassium 4.1  3.5 - 5.3 mEq/L   Chloride 98  96 - 112 mEq/L   CO2 23  19 - 32 mEq/L   Glucose, Bld 105 (*) 70 - 99 mg/dL   BUN 28 (*) 6 - 23 mg/dL   Creat 7.82 (*) 9.56 - 1.35 mg/dL   Total Bilirubin 1.0  0.2 - 1.2 mg/dL   Alkaline Phosphatase 101  39 - 117 U/L   AST 16  0 -  37 U/L   ALT 27  0 - 53 U/L   Total Protein 7.3  6.0 - 8.3 g/dL   Albumin 4.4  3.5 - 5.2 g/dL   Calcium 8.7  8.4 - 16.1 mg/dL   GFR, Est African American 51 (*)    GFR, Est Non African American 44 (*)    Will reck and repeat

## 2014-02-16 ENCOUNTER — Other Ambulatory Visit: Payer: Self-pay | Admitting: Emergency Medicine

## 2014-02-17 NOTE — Telephone Encounter (Signed)
Dr Dareen PianoAnderson, pt hasn't been seen in a year for ED. Do you want to RF once w/note he needs OV, or deny?

## 2014-03-12 ENCOUNTER — Telehealth: Payer: Self-pay | Admitting: *Deleted

## 2014-03-12 NOTE — Telephone Encounter (Signed)
Called and changed the appointment  Patient understood

## 2014-04-08 ENCOUNTER — Ambulatory Visit: Payer: Self-pay | Admitting: Neurology

## 2014-04-30 ENCOUNTER — Encounter (HOSPITAL_COMMUNITY): Payer: Self-pay

## 2014-04-30 ENCOUNTER — Emergency Department (HOSPITAL_COMMUNITY)
Admission: EM | Admit: 2014-04-30 | Discharge: 2014-04-30 | Disposition: A | Discharge status: Home / Self Care | Payer: BLUE CROSS/BLUE SHIELD | Attending: Emergency Medicine | Admitting: Emergency Medicine

## 2014-04-30 DIAGNOSIS — R630 Anorexia: Secondary | ICD-10-CM | POA: Diagnosis not present

## 2014-04-30 DIAGNOSIS — R739 Hyperglycemia, unspecified: Secondary | ICD-10-CM

## 2014-04-30 DIAGNOSIS — R1900 Intra-abdominal and pelvic swelling, mass and lump, unspecified site: Secondary | ICD-10-CM | POA: Insufficient documentation

## 2014-04-30 DIAGNOSIS — R1084 Generalized abdominal pain: Secondary | ICD-10-CM | POA: Diagnosis not present

## 2014-04-30 DIAGNOSIS — F329 Major depressive disorder, single episode, unspecified: Secondary | ICD-10-CM | POA: Insufficient documentation

## 2014-04-30 DIAGNOSIS — N189 Chronic kidney disease, unspecified: Secondary | ICD-10-CM | POA: Diagnosis not present

## 2014-04-30 DIAGNOSIS — R000 Tachycardia, unspecified: Secondary | ICD-10-CM | POA: Diagnosis not present

## 2014-04-30 DIAGNOSIS — Z79899 Other long term (current) drug therapy: Secondary | ICD-10-CM | POA: Diagnosis not present

## 2014-04-30 DIAGNOSIS — R011 Cardiac murmur, unspecified: Secondary | ICD-10-CM | POA: Insufficient documentation

## 2014-04-30 DIAGNOSIS — R110 Nausea: Secondary | ICD-10-CM | POA: Diagnosis not present

## 2014-04-30 DIAGNOSIS — R809 Proteinuria, unspecified: Secondary | ICD-10-CM | POA: Diagnosis not present

## 2014-04-30 DIAGNOSIS — R197 Diarrhea, unspecified: Secondary | ICD-10-CM | POA: Insufficient documentation

## 2014-04-30 DIAGNOSIS — R109 Unspecified abdominal pain: Secondary | ICD-10-CM

## 2014-04-30 DIAGNOSIS — R531 Weakness: Secondary | ICD-10-CM | POA: Insufficient documentation

## 2014-04-30 LAB — COMPREHENSIVE METABOLIC PANEL
ALBUMIN: 4.7 g/dL (ref 3.5–5.2)
ALK PHOS: 93 U/L (ref 39–117)
ALT: 29 U/L (ref 0–53)
AST: 25 U/L (ref 0–37)
Anion gap: 7 (ref 5–15)
BUN: 18 mg/dL (ref 6–23)
CALCIUM: 8.8 mg/dL (ref 8.4–10.5)
CO2: 22 mmol/L (ref 19–32)
CREATININE: 1.16 mg/dL (ref 0.50–1.35)
Chloride: 109 mmol/L (ref 96–112)
GFR calc Af Amer: 87 mL/min — ABNORMAL LOW (ref >90)
GFR calc non Af Amer: 75 mL/min — ABNORMAL LOW (ref >90)
GLUCOSE: 158 mg/dL — AB (ref 70–99)
POTASSIUM: 4.5 mmol/L (ref 3.5–5.1)
Sodium: 138 mmol/L (ref 135–145)
TOTAL PROTEIN: 8.2 g/dL (ref 6.0–8.3)
Total Bilirubin: 0.5 mg/dL (ref 0.3–1.2)

## 2014-04-30 LAB — CBC WITH DIFFERENTIAL/PLATELET
Basophils Absolute: 0 10*3/uL (ref 0.0–0.1)
Basophils Relative: 0 % (ref 0–1)
EOS ABS: 0.1 10*3/uL (ref 0.0–0.7)
Eosinophils Relative: 1 % (ref 0–5)
HCT: 55 % — ABNORMAL HIGH (ref 39.0–52.0)
HEMOGLOBIN: 18.6 g/dL — AB (ref 13.0–17.0)
LYMPHS ABS: 1.1 10*3/uL (ref 0.7–4.0)
Lymphocytes Relative: 8 % — ABNORMAL LOW (ref 12–46)
MCH: 30.9 pg (ref 26.0–34.0)
MCHC: 33.8 g/dL (ref 30.0–36.0)
MCV: 91.5 fL (ref 78.0–100.0)
MONOS PCT: 7 % (ref 3–12)
Monocytes Absolute: 1.1 10*3/uL — ABNORMAL HIGH (ref 0.1–1.0)
NEUTROS ABS: 12.3 10*3/uL — AB (ref 1.7–7.7)
Neutrophils Relative %: 85 % — ABNORMAL HIGH (ref 43–77)
Platelets: 316 10*3/uL (ref 150–400)
RBC: 6.01 MIL/uL — AB (ref 4.22–5.81)
RDW: 12.9 % (ref 11.5–15.5)
WBC: 14.6 10*3/uL — ABNORMAL HIGH (ref 4.0–10.5)

## 2014-04-30 LAB — URINALYSIS, ROUTINE W REFLEX MICROSCOPIC
BILIRUBIN URINE: NEGATIVE
Glucose, UA: NEGATIVE mg/dL
Ketones, ur: NEGATIVE mg/dL
Leukocytes, UA: NEGATIVE
Nitrite: NEGATIVE
Specific Gravity, Urine: 1.027 (ref 1.005–1.030)
UROBILINOGEN UA: 0.2 mg/dL (ref 0.0–1.0)
pH: 5 (ref 5.0–8.0)

## 2014-04-30 LAB — URINE MICROSCOPIC-ADD ON

## 2014-04-30 LAB — LIPASE, BLOOD: LIPASE: 30 U/L (ref 11–59)

## 2014-04-30 MED ORDER — LOPERAMIDE HCL 2 MG PO CAPS: 2.0000 mg | ORAL_CAPSULE | Freq: Four times a day (QID) | ORAL | Status: DC | PRN

## 2014-04-30 MED ORDER — ONDANSETRON 4 MG PO TBDP
4.0000 mg | ORAL_TABLET | Freq: Once | ORAL | Status: AC
  Administered 2014-04-30: 4 mg via ORAL
  Filled 2014-04-30: qty 1

## 2014-04-30 MED ORDER — SODIUM CHLORIDE 0.9 % IV BOLUS (SEPSIS)
1000.0000 mL | Freq: Once | INTRAVENOUS | Status: AC
  Administered 2014-04-30: 1000 mL via INTRAVENOUS

## 2014-04-30 MED ORDER — LOPERAMIDE HCL 2 MG PO CAPS
4.0000 mg | ORAL_CAPSULE | Freq: Once | ORAL | Status: AC
  Administered 2014-04-30: 4 mg via ORAL
  Filled 2014-04-30: qty 2

## 2014-04-30 MED ORDER — ONDANSETRON HCL 4 MG PO TABS: 4.0000 mg | ORAL_TABLET | Freq: Four times a day (QID) | ORAL | Status: DC

## 2014-04-30 NOTE — ED Notes (Signed)
PA made aware of vitals

## 2014-04-30 NOTE — ED Provider Notes (Signed)
Patient reports multiple episodes of diarrhea and burping frequently since 2 AM today. Diarrhea is nonbloody. He also points of diffuse vague abdominal discomfort. Treated himself with Lomotil, one tablet and Zofran 1 tablet, without relief. He feels much improved since being treated here. On exam patient is alert nontoxic lungs clear auscultation heart regular rate and rhythm abdomen obese normal bowel sounds nontender at 3:30 PM he feels ready for discharge and feels improved after treatment here patient was notified by me of hyperglycemia and suggested hemoglobin A1c with primary care physician. Results for orders placed or performed during the hospital encounter of 04/30/14  CBC with Differential  Result Value Ref Range   WBC 14.6 (H) 4.0 - 10.5 K/uL   RBC 6.01 (H) 4.22 - 5.81 MIL/uL   Hemoglobin 18.6 (H) 13.0 - 17.0 g/dL   HCT 69.6 (H) 29.5 - 28.4 %   MCV 91.5 78.0 - 100.0 fL   MCH 30.9 26.0 - 34.0 pg   MCHC 33.8 30.0 - 36.0 g/dL   RDW 13.2 44.0 - 10.2 %   Platelets 316 150 - 400 K/uL   Neutrophils Relative % 85 (H) 43 - 77 %   Neutro Abs 12.3 (H) 1.7 - 7.7 K/uL   Lymphocytes Relative 8 (L) 12 - 46 %   Lymphs Abs 1.1 0.7 - 4.0 K/uL   Monocytes Relative 7 3 - 12 %   Monocytes Absolute 1.1 (H) 0.1 - 1.0 K/uL   Eosinophils Relative 1 0 - 5 %   Eosinophils Absolute 0.1 0.0 - 0.7 K/uL   Basophils Relative 0 0 - 1 %   Basophils Absolute 0.0 0.0 - 0.1 K/uL  Comprehensive metabolic panel  Result Value Ref Range   Sodium 138 135 - 145 mmol/L   Potassium 4.5 3.5 - 5.1 mmol/L   Chloride 109 96 - 112 mmol/L   CO2 22 19 - 32 mmol/L   Glucose, Bld 158 (H) 70 - 99 mg/dL   BUN 18 6 - 23 mg/dL   Creatinine, Ser 7.25 0.50 - 1.35 mg/dL   Calcium 8.8 8.4 - 36.6 mg/dL   Total Protein 8.2 6.0 - 8.3 g/dL   Albumin 4.7 3.5 - 5.2 g/dL   AST 25 0 - 37 U/L   ALT 29 0 - 53 U/L   Alkaline Phosphatase 93 39 - 117 U/L   Total Bilirubin 0.5 0.3 - 1.2 mg/dL   GFR calc non Af Amer 75 (L) >90 mL/min   GFR  calc Af Amer 87 (L) >90 mL/min   Anion gap 7 5 - 15  Lipase, blood  Result Value Ref Range   Lipase 30 11 - 59 U/L  Urinalysis, Routine w reflex microscopic  Result Value Ref Range   Color, Urine YELLOW YELLOW   APPearance CLOUDY (A) CLEAR   Specific Gravity, Urine 1.027 1.005 - 1.030   pH 5.0 5.0 - 8.0   Glucose, UA NEGATIVE NEGATIVE mg/dL   Hgb urine dipstick TRACE (A) NEGATIVE   Bilirubin Urine NEGATIVE NEGATIVE   Ketones, ur NEGATIVE NEGATIVE mg/dL   Protein, ur >440 (A) NEGATIVE mg/dL   Urobilinogen, UA 0.2 0.0 - 1.0 mg/dL   Nitrite NEGATIVE NEGATIVE   Leukocytes, UA NEGATIVE NEGATIVE  Urine microscopic-add on  Result Value Ref Range   WBC, UA 0-2 <3 WBC/hpf   RBC / HPF 0-2 <3 RBC/hpf   Bacteria, UA MANY (A) RARE   Casts HYALINE CASTS (A) NEGATIVE   Urine-Other MUCOUS PRESENT    No  results found.   Doug SouSam Donelle Baba, MD 04/30/14 1538

## 2014-04-30 NOTE — ED Provider Notes (Signed)
CSN: [id number]     Arrival date & time 04/30/14  1144 History   First MD Initiated Contact with Patient 04/30/14 1220     Chief Complaint  Patient presents with  . Nausea  . Diarrhea  . Dizziness     (Consider location/radiation/quality/duration/timing/severity/associated sxs/prior Treatment) HPI Pt is a 44yo male with hx of chronic kidney disease and depression, presenting to ED with c/o nausea and diarrhea with associated abdominal cramping and "acid burps" since this morning.  Pt states he has had about 40 episodes of watery diarrhea this morning. Denies any blood or mucous in stool.  Abdominal pain is diffuse, cramping, 2/10. States he had a stomach virus several months ago, was prescribed antidiarrhea and antiemetic medication, he does not recall name of medication but took a dose of each this morning.  Last BM around 12:30PM. Denies fever or chills. Denies recent travel or sick contacts. States he ate out last night, had wings and blue cheese. Denies hx of abdominal surgeries.   Past Medical History  Diagnosis Date  . Depression   . Chronic kidney disease   . Heart murmur    Past Surgical History  Procedure Laterality Date  . Chest surgery    . Pectus excavatum repair    . Incision and drainage abscess  01/16/2012    Procedure: INCISION AND DRAINAGE ABSCESS;  Surgeon: Wilmon Arms. Corliss Skains, MD;  Location: [facility];  Service: General;  Laterality: Left;   Family History  Problem Relation Age of Onset  . Testicular cancer Father    History  Substance Use Topics  . Smoking status: Never Smoker   . Smokeless tobacco: Never Used  . Alcohol Use: No    Review of Systems  Constitutional: Positive for appetite change and fatigue. Negative for fever, chills, diaphoresis and unexpected weight change.  Respiratory: Negative for cough and shortness of breath.   Cardiovascular: Negative for chest pain and palpitations.  Gastrointestinal: Positive for nausea, abdominal pain ( generalized,  achy) and diarrhea. Negative for vomiting, constipation and blood in stool.  Genitourinary: Positive for decreased urine volume. Negative for dysuria, frequency, hematuria and flank pain.  Musculoskeletal: Negative for myalgias and back pain.  Neurological: Positive for dizziness and weakness ( generalized). Negative for headaches.  All other systems reviewed and are negative.     Allergies  Review of patient's allergies indicates no known allergies.  Home Medications   Prior to Admission medications   Medication Sig Start Date End Date Taking? Authorizing Provider  acetaminophen (TYLENOL) 500 MG tablet Take 1,000 mg by mouth every 6 (six) hours as needed for headache (headache).   Yes Historical Provider, MD  DULoxetine (CYMBALTA) 60 MG capsule Take 60 mg by mouth daily.   Yes Historical Provider, MD  liothyronine (CYTOMEL) 25 MCG tablet Take 25 mcg by mouth daily.   Yes Historical Provider, MD  mirtazapine (REMERON) 30 MG tablet Take 30 mg by mouth at bedtime.   Yes Historical Provider, MD  diphenoxylate-atropine (LOMOTIL) 2.5-0.025 MG per tablet Take 1 tablet by mouth 4 (four) times daily as needed for diarrhea or loose stools. Patient not taking: Reported on 04/30/2014 11/03/13   Tonye Pearson, MD  loperamide (IMODIUM) 2 MG capsule Take 1 capsule (2 mg total) by mouth 4 (four) times daily as needed for diarrhea or loose stools. 04/30/14   Junius Finner, PA-C  ondansetron (ZOFRAN) 4 MG tablet Take 1 tablet (4 mg total) by mouth every 6 (six) hours. 04/30/14   Junius Finner,  PA-C  ondansetron (ZOFRAN) 8 MG tablet Take 1 tablet (8 mg total) by mouth every 8 (eight) hours as needed for nausea or vomiting. Patient not taking: Reported on 04/30/2014 11/03/13   Tonye Pearson, MD  oxyCODONE-acetaminophen (ROXICET) 5-325 MG per tablet Take 1 tablet by mouth every 8 (eight) hours as needed for pain. Patient not taking: Reported on 04/30/2014 10/25/12   Collene Gobble, MD  sildenafil (VIAGRA) 100 MG  tablet Take 1 tablet (100 mg total) by mouth daily as needed. Patient not taking: Reported on 04/30/2014 07/22/12   Jonita Albee, MD  sildenafil (VIAGRA) 100 MG tablet Take 1 tablet (100 mg total) by mouth as needed for erectile dysfunction. PATIENT NEEDS OFFICE VISIT FOR ADDITIONAL REFILLS Patient not taking: Reported on 04/30/2014 02/17/14   Carmelina Dane, MD  Testosterone (ANDROGEL) 40.5 MG/2.5GM (1.62%) GEL Four pumps daily applied to chest and shoulders.PATIENT NEEDS OFFICE VISIT FOR ADDITIONAL REFILLS Patient not taking: Reported on 04/30/2014 08/05/13   Carmelina Dane, MD   BP 174/94 mmHg  Pulse 94  Temp(Src) 98 F (36.7 C) (Oral)  Resp 18  Ht  (1.956 m)  Wt 270 lb (122.471 kg)  BMI 32.01 kg/m2  SpO2 98% Physical Exam  Constitutional: He is oriented to person, place, and time. He appears well-developed and well-nourished.  HENT:  Head: Normocephalic and atraumatic.  Mouth/Throat: Mucous membranes are normal.  Eyes: Conjunctivae and EOM are normal. No scleral icterus.  Neck: Normal range of motion. Neck supple.  Cardiovascular: Regular rhythm and normal heart sounds.  Tachycardia present.   Pulmonary/Chest: Effort normal and breath sounds normal. No respiratory distress. He has no wheezes. He has no rales. He exhibits no tenderness.  Abdominal: Soft. Bowel sounds are normal. He exhibits mass. He exhibits no distension. There is tenderness. There is no rebound and no guarding.  Soft, non-distended, diffuse mild tenderness. No CVAT RUQ-3x4cm, non-tender mass (chronic "fatty deposit" per pt)  Musculoskeletal: Normal range of motion.  Neurological: He is alert and oriented to person, place, and time.  Skin: Skin is warm and dry.  Nursing note and vitals reviewed.   ED Course  Procedures (including critical care time) Labs Review Labs Reviewed  CBC WITH DIFFERENTIAL/PLATELET - Abnormal; Notable for the following:    WBC 14.6 (*)    RBC 6.01 (*)    Hemoglobin 18.6 (*)     HCT 55.0 (*)    Neutrophils Relative % 85 (*)    Neutro Abs 12.3 (*)    Lymphocytes Relative 8 (*)    Monocytes Absolute 1.1 (*)    All other components within normal limits  COMPREHENSIVE METABOLIC PANEL - Abnormal; Notable for the following:    Glucose, Bld 158 (*)    GFR calc non Af Amer 75 (*)    GFR calc Af Amer 87 (*)    All other components within normal limits  URINALYSIS, ROUTINE W REFLEX MICROSCOPIC - Abnormal; Notable for the following:    APPearance CLOUDY (*)    Hgb urine dipstick TRACE (*)    Protein, ur >300 (*)    All other components within normal limits  URINE MICROSCOPIC-ADD ON - Abnormal; Notable for the following:    Bacteria, UA MANY (*)    Casts HYALINE CASTS (*)    All other components within normal limits  LIPASE, BLOOD    Imaging Review No results found.   EKG Interpretation None      MDM   Final diagnoses:  Abdominal cramping  Diarrhea  Nausea  Hyperglycemia  Proteinuria    Pt is a 44yo male presenting to ED with generalized abdominal pain, watery diarrhea and dizziness. Pt appears mildly uncomfortable but moist mucous membranes. Abdomen-soft, mild diffuse tenderness. "chronic fatty deposit" per pt in RUQ, non-tender.   Labs: leukocytosis, labs otherwise c/w previous. Pt given IV fluids.  Discussed pt who also examined pt. Not concerned for surgical abdomen at this time. Symptoms likely viral in nature. Doubt diverticulitis (no point tenderness), cholecystitis or appendicitis.  Will refrain from imaging at this time. Will tx symptomatically for diarrhea.  Rx: imodium and zofran. Pt able to keep down several ounces of PO water. Pulse still elevated in ED, down to 94 at discharge with BP of 174/94.  Will discharge home to f/u with PCP. Encouraged pt to f/u for a Hgb A1C as pt has elevated glucose despite being sick as well as proteinuria.  Home care instructions provided. Return precautions provided. Pt verbalized understanding and agreement with  tx plan.     Junius Finnerrin O'Malley, PA-C 04/30/14 1622  Doug SouSam Jacubowitz, MD 05/03/14 [phone]

## 2014-04-30 NOTE — ED Notes (Addendum)
Patient reports that he began having nausea, diarrhea, abdominal cramping, and "acid burps" this AM. Patient denies seeing any blood in his stool. Patient states he also has had dizziness that started this AM

## 2014-05-01 ENCOUNTER — Emergency Department (HOSPITAL_COMMUNITY)
Admission: EM | Admit: 2014-05-01 | Discharge: 2014-05-01 | Disposition: A | Discharge status: Home / Self Care | Payer: BLUE CROSS/BLUE SHIELD | Attending: Emergency Medicine | Admitting: Emergency Medicine

## 2014-05-01 ENCOUNTER — Encounter (HOSPITAL_COMMUNITY): Payer: Self-pay | Admitting: Emergency Medicine

## 2014-05-01 DIAGNOSIS — Z79899 Other long term (current) drug therapy: Secondary | ICD-10-CM | POA: Diagnosis not present

## 2014-05-01 DIAGNOSIS — R112 Nausea with vomiting, unspecified: Secondary | ICD-10-CM | POA: Insufficient documentation

## 2014-05-01 DIAGNOSIS — R197 Diarrhea, unspecified: Secondary | ICD-10-CM | POA: Diagnosis present

## 2014-05-01 DIAGNOSIS — R011 Cardiac murmur, unspecified: Secondary | ICD-10-CM | POA: Insufficient documentation

## 2014-05-01 DIAGNOSIS — R000 Tachycardia, unspecified: Secondary | ICD-10-CM | POA: Insufficient documentation

## 2014-05-01 DIAGNOSIS — F329 Major depressive disorder, single episode, unspecified: Secondary | ICD-10-CM | POA: Diagnosis not present

## 2014-05-01 DIAGNOSIS — N189 Chronic kidney disease, unspecified: Secondary | ICD-10-CM | POA: Diagnosis not present

## 2014-05-01 MED ORDER — DIPHENOXYLATE-ATROPINE 2.5-0.025 MG PO TABS
2.0000 | ORAL_TABLET | Freq: Once | ORAL | Status: AC
  Administered 2014-05-01: 2 via ORAL
  Filled 2014-05-01: qty 2

## 2014-05-01 MED ORDER — HYDROMORPHONE HCL 1 MG/ML IJ SOLN
1.0000 mg | Freq: Once | INTRAMUSCULAR | Status: AC
  Administered 2014-05-01: 1 mg via INTRAVENOUS
  Filled 2014-05-01: qty 1

## 2014-05-01 MED ORDER — ONDANSETRON HCL 4 MG/2ML IJ SOLN
4.0000 mg | Freq: Once | INTRAMUSCULAR | Status: AC
  Administered 2014-05-01: 4 mg via INTRAVENOUS
  Filled 2014-05-01: qty 2

## 2014-05-01 MED ORDER — SODIUM CHLORIDE 0.9 % IV BOLUS (SEPSIS)
2000.0000 mL | Freq: Once | INTRAVENOUS | Status: AC
  Administered 2014-05-01: 2000 mL via INTRAVENOUS

## 2014-05-01 NOTE — ED Notes (Signed)
MD Kohut at bedside. 

## 2014-05-01 NOTE — ED Notes (Signed)
Pt reports n/v/d since yesterday at 0200. Pt seen here yesterday for same. Pt continues to have symptoms. Pt has gone to restroom 2x since arrival. Has occasional emesis. Generalized abd pain.

## 2014-05-01 NOTE — ED Notes (Signed)
Pt ambulated to restroom with steady gait. Pt has 300 mL left out of 2,000 mL

## 2014-05-01 NOTE — ED Notes (Signed)
Pt alert, oriented, and ambulatory upon DC. He was advised to drink plenty of fluids, fill his prescriptions he was prescribed yesterday, and to follow up with is PCP if not better.

## 2014-05-01 NOTE — ED Notes (Addendum)
Pt reports that he has not had any diarrhea since administration of Lomotil

## 2014-05-01 NOTE — ED Provider Notes (Signed)
CSN: [id number]     Arrival date & time 05/01/14  1013 History   First MD Initiated Contact with Patient 05/01/14 1023     Chief Complaint  Patient presents with  . Diarrhea     (Consider location/radiation/quality/duration/timing/severity/associated sxs/prior Treatment) HPI   43yM with n/v/d since around 0200 yesterday. Seen yesterday in ED yesterday and was feeling better on discharge. Symptoms returned last night again. Innumerable episodes of diarrhea and persistent n/v. Has been taking imodium and zofran with little improvement. No fever or chills. No urinary complaints. No blood in stool or emesis. No abdominal pain. No sick contacts. Denies recent abx use or significant travel history.  Past Medical History  Diagnosis Date  . Depression   . Chronic kidney disease   . Heart murmur    Past Surgical History  Procedure Laterality Date  . Chest surgery    . Pectus excavatum repair    . Incision and drainage abscess  01/16/2012    Procedure: INCISION AND DRAINAGE ABSCESS;  Surgeon: Wilmon ArmsMatthew K. Corliss Skainssuei, MD;  Location: [facility];  Service: General;  Laterality: Left;   Family History  Problem Relation Age of Onset  . Testicular cancer Father    History  Substance Use Topics  . Smoking status: Never Smoker   . Smokeless tobacco: Never Used  . Alcohol Use: No    Review of Systems  All systems reviewed and negative, other than as noted in HPI.   Allergies  Review of patient's allergies indicates no known allergies.  Home Medications   Prior to Admission medications   Medication Sig Start Date End Date Taking? Authorizing Provider  acetaminophen (TYLENOL) 500 MG tablet Take 1,000 mg by mouth every 6 (six) hours as needed for headache (headache).   Yes Historical Provider, MD  DULoxetine (CYMBALTA) 60 MG capsule Take 60 mg by mouth daily.   Yes Historical Provider, MD  liothyronine (CYTOMEL) 25 MCG tablet Take 25 mcg by mouth daily.   Yes Historical Provider, MD  loperamide  (IMODIUM) 2 MG capsule Take 1 capsule (2 mg total) by mouth 4 (four) times daily as needed for diarrhea or loose stools. 04/30/14  Yes Junius FinnerErin O'Malley, PA-C  mirtazapine (REMERON) 30 MG tablet Take 30 mg by mouth at bedtime.   Yes Historical Provider, MD  ondansetron (ZOFRAN) 4 MG tablet Take 1 tablet (4 mg total) by mouth every 6 (six) hours. 04/30/14  Yes Junius FinnerErin O'Malley, PA-C  diphenoxylate-atropine (LOMOTIL) 2.5-0.025 MG per tablet Take 1 tablet by mouth 4 (four) times daily as needed for diarrhea or loose stools. Patient not taking: Reported on 04/30/2014 11/03/13   Tonye Pearsonobert P Doolittle, MD  ondansetron (ZOFRAN) 8 MG tablet Take 1 tablet (8 mg total) by mouth every 8 (eight) hours as needed for nausea or vomiting. Patient not taking: Reported on 04/30/2014 11/03/13   Tonye Pearsonobert P Doolittle, MD  oxyCODONE-acetaminophen (ROXICET) 5-325 MG per tablet Take 1 tablet by mouth every 8 (eight) hours as needed for pain. Patient not taking: Reported on 04/30/2014 10/25/12   Collene GobbleSteven A Daub, MD  sildenafil (VIAGRA) 100 MG tablet Take 1 tablet (100 mg total) by mouth daily as needed. Patient not taking: Reported on 04/30/2014 07/22/12   Jonita Albeehris W Guest, MD  sildenafil (VIAGRA) 100 MG tablet Take 1 tablet (100 mg total) by mouth as needed for erectile dysfunction. PATIENT NEEDS OFFICE VISIT FOR ADDITIONAL REFILLS Patient not taking: Reported on 04/30/2014 02/17/14   Carmelina DaneJeffery S Anderson, MD  Testosterone (ANDROGEL) 40.5 MG/2.5GM (1.62%) GEL  Four pumps daily applied to chest and shoulders.PATIENT NEEDS OFFICE VISIT FOR ADDITIONAL REFILLS Patient not taking: Reported on 04/30/2014 08/05/13   Carmelina Dane, MD   BP 164/112 mmHg  Pulse 121  Temp(Src) 97.7 F (36.5 C) (Oral)  SpO2 100% Physical Exam  Constitutional: He appears well-developed and well-nourished. No distress.  HENT:  Head: Normocephalic and atraumatic.  Eyes: Conjunctivae are normal. Right eye exhibits no discharge. Left eye exhibits no discharge.  Neck: Neck supple.   Cardiovascular: Regular rhythm and normal heart sounds.  Exam reveals no gallop and no friction rub.   No murmur heard. tachycardic  Pulmonary/Chest: Effort normal and breath sounds normal. No respiratory distress.  Abdominal: Soft. He exhibits no distension. There is no tenderness.  Musculoskeletal: He exhibits no edema or tenderness.  Neurological: He is alert.  Skin: Skin is warm and dry.  Psychiatric: He has a normal mood and affect. His behavior is normal. Thought content normal.  Nursing note and vitals reviewed.   ED Course  Procedures (including critical care time) Labs Review Labs Reviewed - No data to display  Imaging Review No results found.   EKG Interpretation None      MDM   Final diagnoses:  Nausea vomiting and diarrhea    43yM with n/v/d since yesterday morning. Benign abdominal exam. Nonbloody stool. Afebrile. Labs from yesterday reviewed. Doubt much utility in repeating. Fairly minimal pain. Opiates ordered for both discomfort and antimotility effect. Feeling better. Repeat exam remains benign. Nontoxic. I feel appropriate for DC.     Raeford Razor, MD 05/02/14 1455

## 2014-07-09 ENCOUNTER — Encounter: Payer: Self-pay | Admitting: Neurology

## 2014-07-09 ENCOUNTER — Ambulatory Visit (INDEPENDENT_AMBULATORY_CARE_PROVIDER_SITE_OTHER): Payer: BLUE CROSS/BLUE SHIELD | Admitting: Neurology

## 2014-07-09 VITALS — BP 132/88 | HR 72 | Resp 18 | Ht 76.0 in | Wt 288.0 lb

## 2014-07-09 DIAGNOSIS — Z9989 Dependence on other enabling machines and devices: Secondary | ICD-10-CM

## 2014-07-09 DIAGNOSIS — R635 Abnormal weight gain: Secondary | ICD-10-CM

## 2014-07-09 DIAGNOSIS — G4733 Obstructive sleep apnea (adult) (pediatric): Secondary | ICD-10-CM

## 2014-07-09 DIAGNOSIS — G478 Other sleep disorders: Secondary | ICD-10-CM | POA: Diagnosis not present

## 2014-07-09 NOTE — Patient Instructions (Signed)
Please continue using your CPAP regularly. While your insurance requires that you use CPAP at least 4 hours each night on 70% of the nights, I recommend, that you not skip any nights and use it throughout the night if you can. Getting used to CPAP and staying with the treatment long term does take time and patience and discipline. Untreated obstructive sleep apnea when it is moderate to severe can have an adverse impact on cardiovascular health and raise her risk for heart disease, arrhythmias, hypertension, congestive heart failure, stroke and diabetes. Untreated obstructive sleep apnea causes sleep disruption, nonrestorative sleep, and sleep deprivation. This can have an impact on your day to day functioning and cause daytime sleepiness and impairment of cognitive function, memory loss, mood disturbance, and problems focussing. Using CPAP regularly can improve these symptoms.  We will bring you back for a full night CPAP titration study to optimize your treatment. Our sleep lab administrative assistant, Leanord Hawkinglycia will call you to schedule your sleep study. If you don't hear back from her by next week please feel free to call her at [phone]. This is her direct line and please leave a message with your phone number to call back if you get the voicemail box.

## 2014-07-09 NOTE — Progress Notes (Signed)
Subjective:    Patient ID: Kelly Johnston is a 44 y.o. male.  HPI     Interim history:   Kelly Johnston is a very friendly 44 year old right-handed gentleman with an underlying medical history of depression, anxiety, and hypothyroidism, who presents for followup consultation of his severe OSA, treated with CPAP. The patient is unaccompanied today. I last saw him on 04/08/2013, at which time he reported compliance with CPAP therapy but inability to use it when he had congestion or cold symptoms. He was doing well with CPAP therapy and reported improved sleep. I suggested a one year FU.   Today, 07/09/2014: I reviewed his compliance data from 06/07/2014 through 07/06/2014 which is a total of 30 days during which time he used his machine only 17 days with percent used days greater than 4 hours at only 40%, indicating poor compliance with an average usage for all nights of only 2 hours and 47 minutes, pressure of 7 cm with EPR of 2. Residual AHI high at 16.7 per hour and leak acceptable with the 95th percentile at 17 L/m. compliance data from the last 90 days showed a little bit better compliance with a compliance percentage of 54%. Residual AHI was little bit better at 12.4 per hour.  Today, 07/09/2014: he reports doing overall well. 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 He has joint custody with his ex-wife. He has 3 children. He will be spending more time with his children over the summer break. He admits to skipping nights (he indicates he has a girlfriend and when he stays at her place, he does not take the machine with him). He had lost some weight but unfortunately he has gained it back.   Previously:  I reviewed his CPAP compliance data from 03/06/2014 through 04/04/2014 which is a total of 30 days during which time he used his machine 24 days with percent used days greater than 4 hours of 60%, indicating suboptimal compliance with an average usage of 4 hours and 20 minutes, pressure at 7 cm with EPR of 2,  residual AHI suboptimal at 9.9 per hour, leak acceptable at 18 L/m for the 95th percentile.  I saw him on 10/06/2012, at which time we discussed a sleep study findings and his compliance. He was doing well with CPAP and was compliant with treatment. I suggested a six-month followup and yearly thereafter if he continued to do well.  I reviewed the compliance data from 07/10/2012 through 08/08/2012 and during this 30 day window he use CPAP 23 days. Percent used days greater than 4 hours was 67% which indicates suboptimal compliance. Average usage for all days was 4 hours and 9 minutes. He is residual AHI was 6.3 with acceptable leak. Pressure was 7 cm with CPR of 2.   I first met him on 04/28/2012 at the request of his PCP. He reported a long-standing history of snoring and EDS at the time. He had a split-night sleep study on 05/13/2012: His baseline sleep efficiency was 75% with a latency to sleep of 6 minutes. Of note, the patient was noted to be nauseated upon arrival and vomited before the test started as well as in the morning. He felt that he had had a stomach bug. He had a markedly increased percentage of stage I sleep and absence of REM sleep prior to CPAP. He had mild to moderate snoring. He had a total of 46 obstructive apneas as well as 101 obstructive hypopneas, rendering in overall AHI of 74.5 per  hour. His baseline oxygen saturation was 91% and his nadir was 85%. He was then titrated on CPAP from 5-8 cm of water pressure and had complete resolution of his sleep disordered breathing on 7 cm of pressure. He was noted to have periodic leg movements of sleep. His PLMS were severe post CPAP at 69.8 per hour with an associated arousal index of 8.2 per hour. He was started on CPAP. 30 day compliance data from 06/10/2012 to 07/09/2012 showed: daily usage, except for one day. His percent used days greater than 4 hours was 90%, indicating great compliance. His residual AHI was 3.1, indicating an adequate  treatment pressure of 7 with EPR of 2. His average usage was 6 hours and 7 minutes for all days.    His Past Medical History Is Significant For: Past Medical History  Diagnosis Date  . Depression   . Chronic kidney disease   . Heart murmur     His Past Surgical History Is Significant For: Past Surgical History  Procedure Laterality Date  . Chest surgery    . Pectus excavatum repair    . Incision and drainage abscess  01/16/2012    Procedure: INCISION AND DRAINAGE ABSCESS;  Surgeon: Imogene Burn. Georgette Dover, MD;  Location: [facility];  Service: General;  Laterality: Left;    His Family History Is Significant For: Family History  Problem Relation Age of Onset  . Testicular cancer Father     His Social History Is Significant For: History   Social History  . Marital Status: Married    Spouse Name: N/A  . Number of Children: N/A  . Years of Education: N/A   Social History Main Topics  . Smoking status: Never Smoker   . Smokeless tobacco: Never Used  . Alcohol Use: No  . Drug Use: No  . Sexual Activity: Yes    Birth Control/ Protection: Other-see comments   Other Topics Concern  . None   Social History Narrative    His Allergies Are:  No Known Allergies:   His Current Medications Are:  Outpatient Encounter Prescriptions as of 07/09/2014  Medication Sig  . acetaminophen (TYLENOL) 500 MG tablet Take 1,000 mg by mouth every 6 (six) hours as needed for headache (headache).  . DULoxetine (CYMBALTA) 60 MG capsule Take 60 mg by mouth daily.  Marland Kitchen liothyronine (CYTOMEL) 25 MCG tablet Take 25 mcg by mouth daily.  Marland Kitchen lisinopril (PRINIVIL,ZESTRIL) 20 MG tablet Take 20 mg by mouth daily.  . mirtazapine (REMERON) 30 MG tablet Take 30 mg by mouth at bedtime.  . sildenafil (VIAGRA) 100 MG tablet Take 1 tablet (100 mg total) by mouth daily as needed.  . sildenafil (VIAGRA) 100 MG tablet Take 1 tablet (100 mg total) by mouth as needed for erectile dysfunction. PATIENT NEEDS OFFICE VISIT FOR  ADDITIONAL REFILLS  . Testosterone (ANDROGEL) 40.5 MG/2.5GM (1.62%) GEL Four pumps daily applied to chest and shoulders.PATIENT NEEDS OFFICE VISIT FOR ADDITIONAL REFILLS  . [DISCONTINUED] diphenoxylate-atropine (LOMOTIL) 2.5-0.025 MG per tablet Take 1 tablet by mouth 4 (four) times daily as needed for diarrhea or loose stools. (Patient not taking: Reported on 04/30/2014)  . [DISCONTINUED] loperamide (IMODIUM) 2 MG capsule Take 1 capsule (2 mg total) by mouth 4 (four) times daily as needed for diarrhea or loose stools.  . [DISCONTINUED] ondansetron (ZOFRAN) 4 MG tablet Take 1 tablet (4 mg total) by mouth every 6 (six) hours.  . [DISCONTINUED] ondansetron (ZOFRAN) 8 MG tablet Take 1 tablet (8 mg total) by mouth every  8 (eight) hours as needed for nausea or vomiting. (Patient not taking: Reported on 04/30/2014)  . [DISCONTINUED] oxyCODONE-acetaminophen (ROXICET) 5-325 MG per tablet Take 1 tablet by mouth every 8 (eight) hours as needed for pain. (Patient not taking: Reported on 04/30/2014)   No facility-administered encounter medications on file as of 07/09/2014.  :  Review of Systems:  Out of a complete 14 point review of systems, all are reviewed and negative with the exception of these symptoms as listed below:   Review of Systems  All other systems reviewed and are negative.   Objective:  Neurologic Exam  Physical Exam Physical Examination:   Filed Vitals:   07/09/14 1039  BP: 132/88  Pulse: 72  Resp: 18    General Examination: The patient is a very pleasant 44 y.o. male in no acute distress. He appears well-developed and well-nourished and well groomed.  HEENT exam: Normocephalic, atraumatic, pupils are equal, round and reactive to light and accommodation. Extraocular tracking is good without nystagmus. Hearing is intact. Face is symmetric with normal facial animation noted. Speech is clear. He has on airway examination a Mallampati class III. He has a narrow appearing airway with a  thickened soft palate and tonsillar size of 2+ overall airway tightness seems to be moderate to severe. Tongue is normal in size and uvula is normal in size as well. Mucosal membranes are moist.  Chest clear to auscultation without wheezing, crackles or rhonchi. Heart sounds are normal without murmurs, rubs or gallops noted. Abdomen is soft, mildly protuberant, nontender with normal bowel sounds. He has no pitting edema in the distal lower extremities bilaterally. Skin is warm and dry. He has no trophic changes and no obvious joint deformities. Neurologically: Mental status: The patient is awake, alert and oriented in all 4 spheres. His memory, attention, language and knowledge are appropriate. Cranial nerves II through XII are as described above. Motor exam reveals normal bulk, strength and tone. He has no drift or tremor. Romberg is negative. Reflexes are 2+ throughout. Sensory exam is intact to light touch, PP, vibration and temperature sense.  Gait, station and balance are normal, and intact tandem walk is noted.  Assessment and Plan:   In summary, HARJOT DIBELLO is a very pleasant 44 year old male with a history of anxiety and depression and a diagnosis of severe OSA, currently on CPAP treatment at a pressure of 7 cwp with suboptimal compliance, due to skipping. His residual AHI has increased. This may be due to skipping nights but also his weight gain. He does endorse nonrestorative sleep. He is advised to try to strive for weight loss. He has gained quite a few pounds since our last visit. I think, at this juncture, we should bring him back for a full night CPAP titration study to optimize his treatment settings. In the meantime, he is advised to be fully compliant with treatment as he has severe obstructive sleep apnea. We talked about his split-night sleep study from March 2014 again today. While he had significant improvements on multiple levels during the treatment portion of his sleep study, he  still had sleep disruption, absence of REM sleep, multiple body position changes and increased arousals during the second part of the study. I do believe it is time to bring him back for a full night CPAP study. He is in agreement with this. I will see him back once that is completed. Today, we talked about his compliance, weight gain, and prior test results. His physical exam  is otherwise stable.  .saocu

## 2014-07-14 ENCOUNTER — Other Ambulatory Visit: Payer: Self-pay | Admitting: Nephrology

## 2014-07-14 DIAGNOSIS — R221 Localized swelling, mass and lump, neck: Secondary | ICD-10-CM

## 2014-07-16 ENCOUNTER — Other Ambulatory Visit: Payer: BLUE CROSS/BLUE SHIELD

## 2014-07-30 ENCOUNTER — Ambulatory Visit
Admission: RE | Admit: 2014-07-30 | Discharge: 2014-07-30 | Disposition: A | Discharge status: Home / Self Care | Payer: BLUE CROSS/BLUE SHIELD | Source: Ambulatory Visit | Attending: Nephrology | Admitting: Nephrology

## 2014-07-30 DIAGNOSIS — R221 Localized swelling, mass and lump, neck: Secondary | ICD-10-CM

## 2014-08-03 ENCOUNTER — Ambulatory Visit (INDEPENDENT_AMBULATORY_CARE_PROVIDER_SITE_OTHER): Payer: BLUE CROSS/BLUE SHIELD | Admitting: Neurology

## 2014-08-03 VITALS — BP 145/85

## 2014-08-03 DIAGNOSIS — G4730 Sleep apnea, unspecified: Secondary | ICD-10-CM

## 2014-08-03 DIAGNOSIS — G4733 Obstructive sleep apnea (adult) (pediatric): Secondary | ICD-10-CM | POA: Diagnosis not present

## 2014-08-03 DIAGNOSIS — G4710 Hypersomnia, unspecified: Secondary | ICD-10-CM

## 2014-08-03 NOTE — Sleep Study (Signed)
Please see the scanned sleep study interpretation located in the Procedure tab within the Chart Review section. 

## 2014-08-09 ENCOUNTER — Telehealth: Payer: Self-pay | Admitting: Neurology

## 2014-08-09 DIAGNOSIS — G4733 Obstructive sleep apnea (adult) (pediatric): Secondary | ICD-10-CM

## 2014-08-09 NOTE — Telephone Encounter (Signed)
Called and left a message for the pt on his cell, informed him of what Dr. Frances Furbish stated below. I also asked him to call with any questions and to call back and schedule a follow up appt in 3 months. When the pt calls, please get him an appt scheduled mid to late September 2016. If he has any questions, take a message. Thank you

## 2014-08-09 NOTE — Telephone Encounter (Signed)
Patient seen on 07/09/14. CPAP (re) titration on 08/03/14 due to report of weight gain and non-restorative sleep.   DME: [facility]  Please call and inform patient that I have entered an order for treatment with CPAP. He did well during the latest sleep study with CPAP, but didn require a higher pressure than in the past. We will, therefore, arrange for an increase in CPAP pressure to 11 cm. FU in 3 months, please arrange. Thanks.  Huston Foley, MD, PhD [facility])

## 2014-08-11 NOTE — Telephone Encounter (Signed)
Noted note below.  Order and sleep report faxed to [facility] for DME [phone].

## 2014-08-16 NOTE — Telephone Encounter (Signed)
I also sent message to Thurston Hole at [facility] to notify her.

## 2014-09-10 ENCOUNTER — Other Ambulatory Visit (HOSPITAL_COMMUNITY): Payer: Self-pay | Admitting: *Deleted

## 2014-09-13 ENCOUNTER — Ambulatory Visit (HOSPITAL_COMMUNITY)
Admission: RE | Admit: 2014-09-13 | Discharge: 2014-09-13 | Disposition: A | Discharge status: Home / Self Care | Payer: BLUE CROSS/BLUE SHIELD | Source: Ambulatory Visit | Attending: Nephrology | Admitting: Nephrology

## 2014-09-13 DIAGNOSIS — D751 Secondary polycythemia: Secondary | ICD-10-CM | POA: Insufficient documentation

## 2014-09-13 LAB — POCT HEMOGLOBIN-HEMACUE: HEMOGLOBIN: 14.7 g/dL (ref 13.0–17.0)

## 2014-09-13 NOTE — Progress Notes (Signed)
Phlebotomy was via left antecubital vein and hemocue prior to phlebotomy was 14.7

## 2014-09-13 NOTE — Progress Notes (Signed)
Phlebotomy of 500cc blood and tolerated well;given soda

## 2015-06-06 DIAGNOSIS — J020 Streptococcal pharyngitis: Secondary | ICD-10-CM | POA: Diagnosis not present

## 2015-06-06 DIAGNOSIS — E291 Testicular hypofunction: Secondary | ICD-10-CM | POA: Diagnosis not present

## 2015-06-28 DIAGNOSIS — R221 Localized swelling, mass and lump, neck: Secondary | ICD-10-CM | POA: Insufficient documentation

## 2015-07-05 ENCOUNTER — Other Ambulatory Visit: Payer: Self-pay | Admitting: Otolaryngology

## 2015-07-05 DIAGNOSIS — R221 Localized swelling, mass and lump, neck: Secondary | ICD-10-CM

## 2015-07-07 ENCOUNTER — Ambulatory Visit
Admission: RE | Admit: 2015-07-07 | Discharge: 2015-07-07 | Disposition: A | Discharge status: Home / Self Care | Payer: BLUE CROSS/BLUE SHIELD | Source: Ambulatory Visit | Attending: Otolaryngology | Admitting: Otolaryngology

## 2015-07-07 DIAGNOSIS — R221 Localized swelling, mass and lump, neck: Secondary | ICD-10-CM

## 2015-07-07 DIAGNOSIS — D170 Benign lipomatous neoplasm of skin and subcutaneous tissue of head, face and neck: Secondary | ICD-10-CM | POA: Diagnosis not present

## 2015-07-07 MED ORDER — IOPAMIDOL (ISOVUE-300) INJECTION 61%: 75.0000 mL | Freq: Once | INTRAVENOUS | Status: DC | PRN

## 2015-08-15 ENCOUNTER — Other Ambulatory Visit: Payer: Self-pay | Admitting: Otolaryngology

## 2015-08-15 DIAGNOSIS — R221 Localized swelling, mass and lump, neck: Secondary | ICD-10-CM | POA: Diagnosis not present

## 2015-08-15 DIAGNOSIS — D170 Benign lipomatous neoplasm of skin and subcutaneous tissue of head, face and neck: Secondary | ICD-10-CM | POA: Diagnosis not present

## 2015-08-15 DIAGNOSIS — D1779 Benign lipomatous neoplasm of other sites: Secondary | ICD-10-CM | POA: Diagnosis not present

## 2015-08-18 DIAGNOSIS — F3341 Major depressive disorder, recurrent, in partial remission: Secondary | ICD-10-CM | POA: Diagnosis not present

## 2015-11-21 DIAGNOSIS — E7800 Pure hypercholesterolemia, unspecified: Secondary | ICD-10-CM | POA: Diagnosis not present

## 2015-11-21 DIAGNOSIS — N181 Chronic kidney disease, stage 1: Secondary | ICD-10-CM | POA: Diagnosis not present

## 2015-11-21 DIAGNOSIS — R809 Proteinuria, unspecified: Secondary | ICD-10-CM | POA: Diagnosis not present

## 2015-11-21 DIAGNOSIS — Z23 Encounter for immunization: Secondary | ICD-10-CM | POA: Diagnosis not present

## 2015-11-21 DIAGNOSIS — N028 Recurrent and persistent hematuria with other morphologic changes: Secondary | ICD-10-CM | POA: Diagnosis not present

## 2015-11-21 DIAGNOSIS — I129 Hypertensive chronic kidney disease with stage 1 through stage 4 chronic kidney disease, or unspecified chronic kidney disease: Secondary | ICD-10-CM | POA: Diagnosis not present

## 2015-12-28 DIAGNOSIS — F3341 Major depressive disorder, recurrent, in partial remission: Secondary | ICD-10-CM | POA: Diagnosis not present

## 2016-04-09 DIAGNOSIS — F3341 Major depressive disorder, recurrent, in partial remission: Secondary | ICD-10-CM | POA: Diagnosis not present

## 2016-04-11 DIAGNOSIS — F3341 Major depressive disorder, recurrent, in partial remission: Secondary | ICD-10-CM | POA: Diagnosis not present

## 2016-05-28 ENCOUNTER — Inpatient Hospital Stay (HOSPITAL_COMMUNITY)
Admission: EM | Admit: 2016-05-28 | Discharge: 2016-05-31 | DRG: 872 | Disposition: A | Discharge status: Home / Self Care | Payer: BLUE CROSS/BLUE SHIELD | Attending: Family Medicine | Admitting: Family Medicine

## 2016-05-28 ENCOUNTER — Encounter (HOSPITAL_COMMUNITY): Payer: Self-pay | Admitting: Emergency Medicine

## 2016-05-28 DIAGNOSIS — A419 Sepsis, unspecified organism: Secondary | ICD-10-CM | POA: Diagnosis present

## 2016-05-28 DIAGNOSIS — Z8043 Family history of malignant neoplasm of testis: Secondary | ICD-10-CM | POA: Diagnosis not present

## 2016-05-28 DIAGNOSIS — B9562 Methicillin resistant Staphylococcus aureus infection as the cause of diseases classified elsewhere: Secondary | ICD-10-CM | POA: Diagnosis not present

## 2016-05-28 DIAGNOSIS — E669 Obesity, unspecified: Secondary | ICD-10-CM | POA: Diagnosis present

## 2016-05-28 DIAGNOSIS — J340 Abscess, furuncle and carbuncle of nose: Secondary | ICD-10-CM | POA: Diagnosis not present

## 2016-05-28 DIAGNOSIS — F329 Major depressive disorder, single episode, unspecified: Secondary | ICD-10-CM | POA: Diagnosis present

## 2016-05-28 DIAGNOSIS — I10 Essential (primary) hypertension: Secondary | ICD-10-CM | POA: Diagnosis present

## 2016-05-28 DIAGNOSIS — L03211 Cellulitis of face: Secondary | ICD-10-CM | POA: Diagnosis present

## 2016-05-28 DIAGNOSIS — G4733 Obstructive sleep apnea (adult) (pediatric): Secondary | ICD-10-CM | POA: Diagnosis present

## 2016-05-28 DIAGNOSIS — Z6835 Body mass index (BMI) 35.0-35.9, adult: Secondary | ICD-10-CM

## 2016-05-28 DIAGNOSIS — F32A Depression, unspecified: Secondary | ICD-10-CM | POA: Diagnosis present

## 2016-05-28 DIAGNOSIS — G4730 Sleep apnea, unspecified: Secondary | ICD-10-CM | POA: Diagnosis present

## 2016-05-28 DIAGNOSIS — Z8614 Personal history of Methicillin resistant Staphylococcus aureus infection: Secondary | ICD-10-CM

## 2016-05-28 DIAGNOSIS — E039 Hypothyroidism, unspecified: Secondary | ICD-10-CM | POA: Diagnosis present

## 2016-05-28 DIAGNOSIS — Z91040 Latex allergy status: Secondary | ICD-10-CM | POA: Diagnosis not present

## 2016-05-28 DIAGNOSIS — J329 Chronic sinusitis, unspecified: Secondary | ICD-10-CM | POA: Diagnosis not present

## 2016-05-28 LAB — I-STAT CG4 LACTIC ACID, ED: LACTIC ACID, VENOUS: 1.17 mmol/L (ref 0.5–1.9)

## 2016-05-28 LAB — URINALYSIS, ROUTINE W REFLEX MICROSCOPIC
BACTERIA UA: NONE SEEN
BILIRUBIN URINE: NEGATIVE
Glucose, UA: NEGATIVE mg/dL
Ketones, ur: 20 mg/dL — AB
LEUKOCYTES UA: NEGATIVE
NITRITE: NEGATIVE
Protein, ur: 30 mg/dL — AB
SPECIFIC GRAVITY, URINE: 1.018 (ref 1.005–1.030)
Squamous Epithelial / LPF: NONE SEEN
pH: 5 (ref 5.0–8.0)

## 2016-05-28 LAB — COMPREHENSIVE METABOLIC PANEL
ALT: 19 U/L (ref 17–63)
ANION GAP: 9 (ref 5–15)
AST: 24 U/L (ref 15–41)
Albumin: 3.9 g/dL (ref 3.5–5.0)
Alkaline Phosphatase: 103 U/L (ref 38–126)
BUN: 14 mg/dL (ref 6–20)
CALCIUM: 8.6 mg/dL — AB (ref 8.9–10.3)
CHLORIDE: 101 mmol/L (ref 101–111)
CO2: 24 mmol/L (ref 22–32)
Creatinine, Ser: 1.35 mg/dL — ABNORMAL HIGH (ref 0.61–1.24)
GFR calc non Af Amer: >60 mL/min (ref >60)
Glucose, Bld: 102 mg/dL — ABNORMAL HIGH (ref 65–99)
POTASSIUM: 4.2 mmol/L (ref 3.5–5.1)
Sodium: 134 mmol/L — ABNORMAL LOW (ref 135–145)
Total Bilirubin: 1 mg/dL (ref 0.3–1.2)
Total Protein: 8 g/dL (ref 6.5–8.1)

## 2016-05-28 LAB — CBC WITH DIFFERENTIAL/PLATELET
Basophils Absolute: 0 10*3/uL (ref 0.0–0.1)
Basophils Relative: 0 %
EOS ABS: 0 10*3/uL (ref 0.0–0.7)
EOS PCT: 0 %
HCT: 48.6 % (ref 39.0–52.0)
Hemoglobin: 16.5 g/dL (ref 13.0–17.0)
Lymphocytes Relative: 11 %
Lymphs Abs: 1.8 10*3/uL (ref 0.7–4.0)
MCH: 30.9 pg (ref 26.0–34.0)
MCHC: 34 g/dL (ref 30.0–36.0)
MCV: 91 fL (ref 78.0–100.0)
MONOS PCT: 10 %
Monocytes Absolute: 1.6 10*3/uL — ABNORMAL HIGH (ref 0.1–1.0)
Neutro Abs: 12.6 10*3/uL — ABNORMAL HIGH (ref 1.7–7.7)
Neutrophils Relative %: 79 %
PLATELETS: 264 10*3/uL (ref 150–400)
RBC: 5.34 MIL/uL (ref 4.22–5.81)
RDW: 12.9 % (ref 11.5–15.5)
WBC: 16 10*3/uL — ABNORMAL HIGH (ref 4.0–10.5)

## 2016-05-28 MED ORDER — PIPERACILLIN-TAZOBACTAM 3.375 G IVPB 30 MIN
3.3750 g | Freq: Once | INTRAVENOUS | Status: AC
  Administered 2016-05-28: 3.375 g via INTRAVENOUS
  Filled 2016-05-28: qty 50

## 2016-05-28 MED ORDER — VANCOMYCIN HCL 10 G IV SOLR
1500.0000 mg | Freq: Once | INTRAVENOUS | Status: AC
  Administered 2016-05-28: 1500 mg via INTRAVENOUS
  Filled 2016-05-28: qty 1500

## 2016-05-28 MED ORDER — ENOXAPARIN SODIUM 40 MG/0.4ML ~~LOC~~ SOLN: 40.0000 mg | SUBCUTANEOUS | Status: DC

## 2016-05-28 MED ORDER — ACETAMINOPHEN 325 MG PO TABS: 650.0000 mg | ORAL_TABLET | Freq: Four times a day (QID) | ORAL | Status: DC | PRN

## 2016-05-28 MED ORDER — ACETAMINOPHEN 325 MG PO TABS
650.0000 mg | ORAL_TABLET | Freq: Once | ORAL | Status: AC
  Administered 2016-05-28: 650 mg via ORAL
  Filled 2016-05-28: qty 2

## 2016-05-28 MED ORDER — VANCOMYCIN HCL IN DEXTROSE 1-5 GM/200ML-% IV SOLN
1000.0000 mg | Freq: Two times a day (BID) | INTRAVENOUS | Status: DC
  Administered 2016-05-29 – 2016-05-31 (×5): 1000 mg via INTRAVENOUS
  Filled 2016-05-28 (×5): qty 200

## 2016-05-28 MED ORDER — ACETAMINOPHEN 650 MG RE SUPP: 650.0000 mg | Freq: Four times a day (QID) | RECTAL | Status: DC | PRN

## 2016-05-28 MED ORDER — VANCOMYCIN HCL IN DEXTROSE 1-5 GM/200ML-% IV SOLN
1000.0000 mg | Freq: Once | INTRAVENOUS | Status: AC
  Administered 2016-05-28: 1000 mg via INTRAVENOUS
  Filled 2016-05-28: qty 200

## 2016-05-28 MED ORDER — SODIUM CHLORIDE 0.9 % IV BOLUS (SEPSIS): 2000.0000 mL | Freq: Once | INTRAVENOUS | Status: DC

## 2016-05-28 MED ORDER — LIOTHYRONINE SODIUM 25 MCG PO TABS
25.0000 ug | ORAL_TABLET | Freq: Every day | ORAL | Status: DC
  Administered 2016-05-29 – 2016-05-31 (×3): 25 ug via ORAL
  Filled 2016-05-28 (×3): qty 1

## 2016-05-28 MED ORDER — MIRTAZAPINE 30 MG PO TABS
30.0000 mg | ORAL_TABLET | Freq: Every day | ORAL | Status: DC
  Administered 2016-05-28 – 2016-05-30 (×3): 30 mg via ORAL
  Filled 2016-05-28 (×3): qty 1

## 2016-05-28 MED ORDER — SODIUM CHLORIDE 0.9 % IV BOLUS (SEPSIS)
3000.0000 mL | Freq: Once | INTRAVENOUS | Status: AC
  Administered 2016-05-28: 3000 mL via INTRAVENOUS

## 2016-05-28 MED ORDER — DULOXETINE HCL 60 MG PO CPEP
120.0000 mg | ORAL_CAPSULE | Freq: Every day | ORAL | Status: DC
  Administered 2016-05-29 – 2016-05-31 (×3): 120 mg via ORAL
  Filled 2016-05-28 (×2): qty 2
  Filled 2016-05-28: qty 4

## 2016-05-28 MED ORDER — ENOXAPARIN SODIUM 60 MG/0.6ML ~~LOC~~ SOLN
60.0000 mg | Freq: Every day | SUBCUTANEOUS | Status: DC
  Administered 2016-05-28 – 2016-05-30 (×3): 60 mg via SUBCUTANEOUS
  Filled 2016-05-28 (×3): qty 0.6

## 2016-05-28 MED ORDER — HYDRALAZINE HCL 20 MG/ML IJ SOLN: 10.0000 mg | INTRAMUSCULAR | Status: DC | PRN

## 2016-05-28 MED ORDER — PIPERACILLIN-TAZOBACTAM 3.375 G IVPB
3.3750 g | Freq: Three times a day (TID) | INTRAVENOUS | Status: DC
  Administered 2016-05-29 – 2016-05-31 (×7): 3.375 g via INTRAVENOUS
  Filled 2016-05-28 (×7): qty 50

## 2016-05-28 MED ORDER — SODIUM CHLORIDE 0.9 % IV SOLN
INTRAVENOUS | Status: DC
  Administered 2016-05-28 – 2016-05-29 (×2): via INTRAVENOUS

## 2016-05-28 NOTE — ED Notes (Signed)
WILL TRANSPORT PT TO 1W 1101-1. AAOX4. PT IN NO APPARENT DISTRESS OR PAIN. IVF INFUSING W/O PAIN OR SWELLING. THE OPPORTUNITY TO ASK QUESTIONS WAS PROVIDED.

## 2016-05-28 NOTE — ED Provider Notes (Signed)
[facility]-EMERGENCY DEPT Provider Note   CSN: [id number] Arrival date & time: 05/28/16  1820     History   Chief Complaint Chief Complaint  Patient presents with  . Recurrent Skin Infections    HPI Kelly Johnston is a 46 y.o. male.  46 year old male presents with 2 day history of rapidly progressive rash that started across the bridge of his nose is now progressed to involve his entire midface. Has a history of MRSA and saw his doctor today who placed the patient on Bactrim and doxycycline. Since that time is rash has began to spread inferiorly. He's also developed a fever at home up to 102.1. Denies any fever or chills. No cough or congestion. No urinary symptoms. Denies any dyspnea or trouble swallowing. No antipyretics use prior to arrival. Does also have a history of IgA nephropathy.      Past Medical History:  Diagnosis Date  . Chronic kidney disease   . Depression   . Heart murmur   . MRSA (methicillin resistant staph aureus) culture positive     Patient Active Problem List   Diagnosis Date Noted  . Obesity (BMI 30.0-34.9) 03/17/2012  . Sleep disturbance 03/17/2012  . Depression 01/16/2012  . Abscess of left thigh 01/13/2012  . IgA nephropathy 01/10/2012    Past Surgical History:  Procedure Laterality Date  . chest surgery    . INCISION AND DRAINAGE ABSCESS  01/16/2012   Procedure: INCISION AND DRAINAGE ABSCESS;  Surgeon: Wilmon Arms. Corliss Skains, MD;  Location: [facility];  Service: General;  Laterality: Left;  . PECTUS EXCAVATUM REPAIR         Home Medications    Prior to Admission medications   Medication Sig Start Date End Date Taking? Authorizing Provider  acetaminophen (TYLENOL) 500 MG tablet Take 1,000 mg by mouth every 6 (six) hours as needed for headache (headache).    Historical Provider, MD  DULoxetine (CYMBALTA) 60 MG capsule Take 60 mg by mouth daily.    Historical Provider, MD  liothyronine (CYTOMEL) 25 MCG tablet Take 25 mcg by mouth daily.    Historical  Provider, MD  lisinopril (PRINIVIL,ZESTRIL) 20 MG tablet Take 20 mg by mouth daily.    Historical Provider, MD  mirtazapine (REMERON) 30 MG tablet Take 30 mg by mouth at bedtime.    Historical Provider, MD  sildenafil (VIAGRA) 100 MG tablet Take 1 tablet (100 mg total) by mouth daily as needed. 07/22/12   Jonita Albee, MD  sildenafil (VIAGRA) 100 MG tablet Take 1 tablet (100 mg total) by mouth as needed for erectile dysfunction. PATIENT NEEDS OFFICE VISIT FOR ADDITIONAL REFILLS 02/17/14   Carmelina Dane, MD  Testosterone (ANDROGEL) 40.5 MG/2.5GM (1.62%) GEL Four pumps daily applied to chest and shoulders.PATIENT NEEDS OFFICE VISIT FOR ADDITIONAL REFILLS 08/05/13   Carmelina Dane, MD    Family History Family History  Problem Relation Age of Onset  . Testicular cancer Father     Social History Social History  Substance Use Topics  . Smoking status: Never Smoker  . Smokeless tobacco: Never Used  . Alcohol use No     Allergies   Latex   Review of Systems Review of Systems  All other systems reviewed and are negative.    Physical Exam Updated Vital Signs BP (!) 151/92 (BP Location: Left Arm)   Pulse (!) 116   Temp 100.3 F (37.9 C) (Oral)   Resp 20   Ht  (1.93 m)   Wt 131.5 kg  SpO2 97%   BMI 35.30 kg/m   Physical Exam  Constitutional: He is oriented to person, place, and time. He appears well-developed and well-nourished.  Non-toxic appearance. No distress.  HENT:  Head: Normocephalic and atraumatic.    Eyes: Conjunctivae, EOM and lids are normal. Pupils are equal, round, and reactive to light.  Neck: Normal range of motion. Neck supple. No tracheal deviation present. No thyroid mass present.  Cardiovascular: Regular rhythm and normal heart sounds.  Tachycardia present.  Exam reveals no gallop.   No murmur heard. Pulmonary/Chest: Effort normal and breath sounds normal. No stridor. No respiratory distress. He has no decreased breath sounds. He has no  wheezes. He has no rhonchi. He has no rales.  Abdominal: Soft. Normal appearance and bowel sounds are normal. He exhibits no distension. There is no tenderness. There is no rebound and no CVA tenderness.  Musculoskeletal: Normal range of motion. He exhibits no edema or tenderness.  Neurological: He is alert and oriented to person, place, and time. He has normal strength. No cranial nerve deficit or sensory deficit. GCS eye subscore is 4. GCS verbal subscore is 5. GCS motor subscore is 6.  Skin: Skin is warm and dry. No abrasion and no rash noted.  Psychiatric: He has a normal mood and affect. His speech is normal and behavior is normal.  Nursing note and vitals reviewed.    ED Treatments / Results  Labs (all labs ordered are listed, but only abnormal results are displayed) Labs Reviewed  COMPREHENSIVE METABOLIC PANEL - Abnormal; Notable for the following:       Result Value   Sodium 134 (*)    Glucose, Bld 102 (*)    Creatinine, Ser 1.35 (*)    Calcium 8.6 (*)    All other components within normal limits  CULTURE, BLOOD (ROUTINE X 2)  CULTURE, BLOOD (ROUTINE X 2)  CBC WITH DIFFERENTIAL/PLATELET  URINALYSIS, ROUTINE W REFLEX MICROSCOPIC  CBC WITH DIFFERENTIAL/PLATELET  I-STAT CG4 LACTIC ACID, ED    EKG  EKG Interpretation None       Radiology No results found.  Procedures Procedures (including critical care time)  Medications Ordered in ED Medications  vancomycin (VANCOCIN) IVPB 1000 mg/200 mL premix (not administered)     Initial Impression / Assessment and Plan / ED Course  I have reviewed the triage vital signs and the nursing notes.  Pertinent labs & imaging results that were available during my care of the patient were reviewed by me and considered in my medical decision making (see chart for details).     Patient started on vancomycin due to rapidly progressing cellulitis. Medicated with Tylenol for fever. Will consult hospitalist for admission  Final  Clinical Impressions(s) / ED Diagnoses   Final diagnoses:  None    New Prescriptions New Prescriptions   No medications on file     Lorre Nick, MD 05/28/16 2054

## 2016-05-28 NOTE — ED Notes (Signed)
FIRST ATTEMPT TO CALL REPORT TO 1W [id number].

## 2016-05-28 NOTE — H&P (Signed)
History and Physical    Kelly Johnston ZOX:[id number] DOB: June 24, 1970 DOA: 05/28/2016  PCP: PROVIDER NOT IN SYSTEM  Patient coming from: Home.  Chief Complaint: Facial rash fever or chills.  HPI: Kelly Johnston is a 46 y.o. male with history of hypertension, hypothyroidism presents to the ER with complaints of increasing rash over the face over the last 3 days. Patient has been having subjective feeling of fever and chills. Denies any headache or visual symptoms or any diplopia. The rash started on the fridge of the nose which gradually spread. Patient had gone to his PCP and was prescribed antibiotics which patient had taken today.   ED Course: In the ER patient was found to be tachycardic and febrile with lab work showing leukocytosis. Patient's blood pressure was low normal. Lactate was normal. Blood cultures were obtained and patient admitted for sepsis from facial cellulitis.  Review of Systems: As per HPI, rest all negative.   Past Medical History:  Diagnosis Date  . Chronic kidney disease   . Depression   . Heart murmur   . MRSA (methicillin resistant staph aureus) culture positive     Past Surgical History:  Procedure Laterality Date  . chest surgery    . INCISION AND DRAINAGE ABSCESS  01/16/2012   Procedure: INCISION AND DRAINAGE ABSCESS;  Surgeon: Wilmon Arms. Corliss Skains, MD;  Location: [facility];  Service: General;  Laterality: Left;  . PECTUS EXCAVATUM REPAIR       reports that he has never smoked. He has never used smokeless tobacco. He reports that he does not drink alcohol or use drugs.  Allergies  Allergen Reactions  . Latex Rash    Family History  Problem Relation Age of Onset  . Testicular cancer Father     Prior to Admission medications   Medication Sig Start Date End Date Taking? Authorizing Provider  acetaminophen (TYLENOL) 500 MG tablet Take 1,000 mg by mouth every 6 (six) hours as needed for headache (headache).   Yes Historical Provider, MD  doxycycline  (VIBRAMYCIN) 100 MG capsule Take 100 mg by mouth 2 (two) times daily. 05/28/16  Yes Historical Provider, MD  DULoxetine (CYMBALTA) 60 MG capsule Take 120 mg by mouth daily.    Yes Historical Provider, MD  liothyronine (CYTOMEL) 25 MCG tablet Take 25 mcg by mouth daily.   Yes Historical Provider, MD  lisinopril (PRINIVIL,ZESTRIL) 20 MG tablet Take 20 mg by mouth daily.   Yes Historical Provider, MD  mirtazapine (REMERON) 30 MG tablet Take 30 mg by mouth at bedtime.   Yes Historical Provider, MD  QUEtiapine (SEROQUEL) 100 MG tablet Take 50 mg by mouth at bedtime as needed for sleep. 05/05/16  Yes Historical Provider, MD  sildenafil (VIAGRA) 100 MG tablet Take 1 tablet (100 mg total) by mouth as needed for erectile dysfunction. PATIENT NEEDS OFFICE VISIT FOR ADDITIONAL REFILLS 02/17/14  Yes Carmelina Dane, MD  sulfamethoxazole-trimethoprim (BACTRIM DS,SEPTRA DS) 800-160 MG tablet Take 1 tablet by mouth 2 (two) times daily. 05/28/16  Yes Historical Provider, MD  Testosterone (ANDROGEL) 40.5 MG/2.5GM (1.62%) GEL Four pumps daily applied to chest and shoulders.PATIENT NEEDS OFFICE VISIT FOR ADDITIONAL REFILLS Patient not taking: Reported on 05/28/2016 08/05/13   Carmelina Dane, MD    Physical Exam: Vitals:   05/28/16 2050 05/28/16 2130 05/28/16 2200 05/28/16 2212  BP:  103/64 129/82   Pulse:  (!) 112 (!) 106   Resp:  20    Temp:    98.3 F (36.8  C)  TempSrc:    Oral  SpO2:  100% 96%   Weight: 131.5 kg (290 lb)     Height:  (1.93 m)         Constitutional: Moderately built and nourished. Vitals:   05/28/16 2050 05/28/16 2130 05/28/16 2200 05/28/16 2212  BP:  103/64 129/82   Pulse:  (!) 112 (!) 106   Resp:  20    Temp:    98.3 F (36.8 C)  TempSrc:    Oral  SpO2:  100% 96%   Weight: 131.5 kg (290 lb)     Height:  (1.93 m)      Eyes: Anicteric. No pallor. ENMT: Facial erythema extending from the bridge of the nose to bilateral maxillary area. Neck: Erythema on the nasal  bridge. Respiratory: No rhonchi or crepitations. Cardiovascular: S1 and S2 heard no murmurs appreciated. Abdomen: Soft nontender bowel sounds present. Musculoskeletal: No edema. No joint effusion. Skin: Facial rash. Neurologic: Alert awake oriented to time place and person. Moves all extremities. Psychiatric: Appears normal. Normal affect.   Labs on Admission: I have personally reviewed following labs and imaging studies  CBC:  Recent Labs Lab 05/28/16 2042  WBC 16.0*  NEUTROABS 12.6*  HGB 16.5  HCT 48.6  MCV 91.0  PLT 264   Basic Metabolic Panel:  Recent Labs Lab 05/28/16 1945  NA 134*  K 4.2  CL 101  CO2 24  GLUCOSE 102*  BUN 14  CREATININE 1.35*  CALCIUM 8.6*   GFR: Estimated Creatinine Clearance: 100.2 mL/min (A) (by C-G formula based on SCr of 1.35 mg/dL (H)). Liver Function Tests:  Recent Labs Lab 05/28/16 1945  AST 24  ALT 19  ALKPHOS 103  BILITOT 1.0  PROT 8.0  ALBUMIN 3.9   No results for input(s): LIPASE, AMYLASE in the last 168 hours. No results for input(s): AMMONIA in the last 168 hours. Coagulation Profile: No results for input(s): INR, PROTIME in the last 168 hours. Cardiac Enzymes: No results for input(s): CKTOTAL, CKMB, CKMBINDEX, TROPONINI in the last 168 hours. BNP (last 3 results) No results for input(s): PROBNP in the last 8760 hours. HbA1C: No results for input(s): HGBA1C in the last 72 hours. CBG: No results for input(s): GLUCAP in the last 168 hours. Lipid Profile: No results for input(s): CHOL, HDL, LDLCALC, TRIG, CHOLHDL, LDLDIRECT in the last 72 hours. Thyroid Function Tests: No results for input(s): TSH, T4TOTAL, FREET4, T3FREE, THYROIDAB in the last 72 hours. Anemia Panel: No results for input(s): VITAMINB12, FOLATE, FERRITIN, TIBC, IRON, RETICCTPCT in the last 72 hours. Urine analysis:    Component Value Date/Time   COLORURINE YELLOW 05/28/2016 2033   APPEARANCEUR CLEAR 05/28/2016 2033   LABSPEC 1.018 05/28/2016  2033   PHURINE 5.0 05/28/2016 2033   GLUCOSEU NEGATIVE 05/28/2016 2033   HGBUR SMALL (A) 05/28/2016 2033   BILIRUBINUR NEGATIVE 05/28/2016 2033   KETONESUR 20 (A) 05/28/2016 2033   PROTEINUR 30 (A) 05/28/2016 2033   UROBILINOGEN 0.2 04/30/2014 1250   NITRITE NEGATIVE 05/28/2016 2033   LEUKOCYTESUR NEGATIVE 05/28/2016 2033   Sepsis Labs: (procalcitonin:4,lacticidven:4) )No results found for this or any previous visit (from the past 240 hour(s)).   Radiological Exams on Admission: No results found.    Assessment/Plan Principal Problem:   Sepsis (HCC) Active Problems:   Depression   Facial cellulitis   Essential hypertension   Sleep apnea   Hypothyroidism    1. Sepsis from facial cellulitis - patient has been placed on vancomycin and  Zosyn. Continue with hydration. Follow blood cultures. 2. Hypertension - since patient's blood pressure is in the low normal I am holding off lisinopril for now. When necessary IV hydralazine for systolic blood pressure more than 160. 3. Hypothyroidism - continue home medications. 4. Sleep apnea on CPAP. 5. History of depression - continue home medications.   DVT prophylaxis: Lovenox. Code Status: Full code.  Family Communication: Discussed with patient.  Disposition Plan: Home.  Consults called: None.  Admission status: Observation.    Eduard Clos MD [facility] Pager [phone].  If 7PM-7AM, please contact night-coverage [email or web address] Password [facility]  05/28/2016, 10:25 PM

## 2016-05-28 NOTE — ED Triage Notes (Signed)
Pt states he thinks he may have a MRSA infection on his face  Pt states it started on his nose on Saturday and it was red and swollen and since it has spread to his cheeks and down his face  Pt was seen by his PCP this morning and was started on Doxycycline and Bactrim DS  Pt has hx of MRSA

## 2016-05-28 NOTE — Progress Notes (Signed)
Pharmacy Antibiotic Note  Kelly Johnston is a 46 y.o. male with history of recurrent skin infections admitted on 05/28/2016 with cellulitis.  Pharmacy has been consulted for Zosyn and Vancomycin dosing.  Plan: Zosyn 3.375g IV q8h (4 hour infusion).  Vancomycin 1 Gm x1 ED + 1500 mg = 2500 mg, then 1 Gm IV q12h VT=10-15 mg/L Daily Scr F/u levels as needed Rx adjusted Lovenox to 60 mg daily (~0.5 mg/kg) for DVT prophylaxis  Height:  (193 cm) Weight: 290 lb (131.5 kg) IBW/kg (Calculated) : 86.8  Temp (24hrs), Avg:100.2 F (37.9 C), Min:98.3 F (36.8 C), Max:102.1 F (38.9 C)   Recent Labs Lab 05/28/16 1945 05/28/16 1957 05/28/16 2042  WBC  --   --  16.0*  CREATININE 1.35*  --   --   LATICACIDVEN  --  1.17  --     Estimated Creatinine Clearance: 100.2 mL/min (A) (by C-G formula based on SCr of 1.35 mg/dL (H)).    Allergies  Allergen Reactions  . Latex Rash    Antimicrobials this admission: 4/2 zosyn >>  4/2 vancomycin >>   Dose adjustments this admission:   Microbiology results:  BCx:   UCx:    Sputum:    MRSA PCR:   Thank you for allowing pharmacy to be a part of this patient's care.  Lorenza Evangelist 05/28/2016 10:34 PM

## 2016-05-29 ENCOUNTER — Observation Stay (HOSPITAL_COMMUNITY): Payer: BLUE CROSS/BLUE SHIELD

## 2016-05-29 DIAGNOSIS — F329 Major depressive disorder, single episode, unspecified: Secondary | ICD-10-CM | POA: Diagnosis present

## 2016-05-29 DIAGNOSIS — E039 Hypothyroidism, unspecified: Secondary | ICD-10-CM | POA: Diagnosis not present

## 2016-05-29 DIAGNOSIS — A419 Sepsis, unspecified organism: Secondary | ICD-10-CM | POA: Diagnosis not present

## 2016-05-29 DIAGNOSIS — L03211 Cellulitis of face: Secondary | ICD-10-CM | POA: Diagnosis not present

## 2016-05-29 DIAGNOSIS — Z8614 Personal history of Methicillin resistant Staphylococcus aureus infection: Secondary | ICD-10-CM | POA: Diagnosis not present

## 2016-05-29 DIAGNOSIS — Z91040 Latex allergy status: Secondary | ICD-10-CM | POA: Diagnosis not present

## 2016-05-29 DIAGNOSIS — I10 Essential (primary) hypertension: Secondary | ICD-10-CM | POA: Diagnosis not present

## 2016-05-29 DIAGNOSIS — G4733 Obstructive sleep apnea (adult) (pediatric): Secondary | ICD-10-CM | POA: Diagnosis not present

## 2016-05-29 DIAGNOSIS — J329 Chronic sinusitis, unspecified: Secondary | ICD-10-CM | POA: Diagnosis not present

## 2016-05-29 DIAGNOSIS — E669 Obesity, unspecified: Secondary | ICD-10-CM | POA: Diagnosis present

## 2016-05-29 DIAGNOSIS — Z8043 Family history of malignant neoplasm of testis: Secondary | ICD-10-CM | POA: Diagnosis not present

## 2016-05-29 DIAGNOSIS — Z6835 Body mass index (BMI) 35.0-35.9, adult: Secondary | ICD-10-CM | POA: Diagnosis not present

## 2016-05-29 LAB — BASIC METABOLIC PANEL
Anion gap: 5 (ref 5–15)
BUN: 14 mg/dL (ref 6–20)
CALCIUM: 7.4 mg/dL — AB (ref 8.9–10.3)
CO2: 26 mmol/L (ref 22–32)
CREATININE: 1.23 mg/dL (ref 0.61–1.24)
Chloride: 108 mmol/L (ref 101–111)
GFR calc non Af Amer: >60 mL/min (ref >60)
Glucose, Bld: 96 mg/dL (ref 65–99)
Potassium: 4 mmol/L (ref 3.5–5.1)
Sodium: 139 mmol/L (ref 135–145)

## 2016-05-29 LAB — MRSA PCR SCREENING: MRSA by PCR: NEGATIVE

## 2016-05-29 LAB — CBC
HCT: 43 % (ref 39.0–52.0)
Hemoglobin: 13.8 g/dL (ref 13.0–17.0)
MCH: 29.6 pg (ref 26.0–34.0)
MCHC: 32.1 g/dL (ref 30.0–36.0)
MCV: 92.1 fL (ref 78.0–100.0)
PLATELETS: 229 10*3/uL (ref 150–400)
RBC: 4.67 MIL/uL (ref 4.22–5.81)
RDW: 12.9 % (ref 11.5–15.5)
WBC: 11.2 10*3/uL — ABNORMAL HIGH (ref 4.0–10.5)

## 2016-05-29 LAB — HIV ANTIBODY (ROUTINE TESTING W REFLEX): HIV Screen 4th Generation wRfx: NONREACTIVE

## 2016-05-29 MED ORDER — IOPAMIDOL (ISOVUE-300) INJECTION 61%
INTRAVENOUS | Status: AC
  Administered 2016-05-29: 75 mL
  Filled 2016-05-29: qty 75

## 2016-05-29 NOTE — Progress Notes (Signed)
Gave report to Vickie, Charity fundraiser and all questions answered. Pt will take all belongings with him including CPAP.

## 2016-05-29 NOTE — Progress Notes (Signed)
[facility] Progress Note  Patient: Kelly Johnston ZOX:[id number]   PCP: PROVIDER NOT IN SYSTEM DOB: [birth year]   DOA: 05/28/2016   DOS: 05/29/2016   Date of Service: the patient was seen and examined on 05/29/2016  Subjective: Patient continues to have significant swelling of his face as well as redness of the surrounding area.  Brief hospital course: Pt. with PMH of hypertension, hypothyroidism, sleep apnea; admitted on 05/28/2016, with complaint of facial redness, was found to have facial cellulitis. Patient noted redness around his nasal bridge from his C Pap mask few days ago. Over a period of time of 3 days it has progressed to involve his both cheeks and he started having fever and pain in his neck and therefore came to the hospital. Currently further plan is continue IV antibiotics.  Assessment and Plan: 1. Sepsis (HCC) Due to facial cellulitis. With leukocytosis and fever and tachycardia patient meeting criteria. CT maxillofacial is not showing any evidence of orbital cellulitis. Clinically also appears facial cellulitis. We'll continue with vancomycin and Zosyn, history of MRSA cellulitis in the past.  Denies any black color discharge or pain in his nose. No concern for fungal infection at present.  Monitor blood cultures.  2. Essential hypertension. Blood pressure stable. Currently holding blood pressure medication. Stopping fluids.  3. Hypothyroidism. Continue Synthroid.  4. Obstructive sleep apnea.  Continue C Pap. Patient denies any fungal collection in his C Pap.  Bowel regimen: last BM 05/29/2016 Diet: cardiac diet DVT Prophylaxis: subcutaneous Heparin  Advance goals of care discussion: full code  Family Communication: no family was present at bedside, at the time of interview.   Disposition:  Discharge to home  Expected discharge date: 05/31/2016. Depending on improvement in cellulitis and culture clearance  Consultants: none Procedures:  none  Antibiotics: Anti-infectives    Start     Dose/Rate Route Frequency Ordered Stop   05/29/16 1000  vancomycin (VANCOCIN) IVPB 1000 mg/200 mL premix     1,000 mg 200 mL/hr over 60 Minutes Intravenous Every 12 hours 05/28/16 2346     05/29/16 0800  piperacillin-tazobactam (ZOSYN) IVPB 3.375 g     3.375 g 12.5 mL/hr over 240 Minutes Intravenous Every 8 hours 05/28/16 2346     05/28/16 2300  vancomycin (VANCOCIN) 1,500 mg in sodium chloride 0.9 % 500 mL IVPB     1,500 mg 250 mL/hr over 120 Minutes Intravenous  Once 05/28/16 2250 05/29/16 0118   05/28/16 2230  piperacillin-tazobactam (ZOSYN) IVPB 3.375 g     3.375 g 100 mL/hr over 30 Minutes Intravenous  Once 05/28/16 2224 05/28/16 2350   05/28/16 2100  vancomycin (VANCOCIN) IVPB 1000 mg/200 mL premix     1,000 mg 200 mL/hr over 60 Minutes Intravenous  Once 05/28/16 2051 05/28/16 2213       Objective: Physical Exam: Vitals:   05/28/16 2248 05/29/16 0048 05/29/16 0533 05/29/16 1038  BP: (!) 145/86  (!) 141/96 (!) 146/70  Pulse: (!) 102 96 83 100  Resp: Temp: 98.7 F (37.1 C)  98.4 F (36.9 C) 98 F (36.7 C)  TempSrc: Oral  Oral Oral  SpO2: 99% 99% 100% 98%  Weight:      Height:        Intake/Output Summary (Last 24 hours) at 05/29/16 1619 Last data filed at 05/29/16 1612  Gross per 24 hour  Intake          4871.25 ml  Output  0 ml  Net          4871.25 ml   Filed Weights   05/28/16 2050  Weight: 131.5 kg (290 lb)   General: Alert, Awake and Oriented to Time, Place and Person. Appear in mild distress, affect appropriate Eyes: PERRL, Conjunctiva normal ENT: Oral Mucosa clear moist. Neck: no JVD, no Abnormal Mass Or lumps Cardiovascular: S1 and S2 Present, no Murmur, Respiratory: Bilateral Air entry equal and Decreased, no use of accessory muscle, Clear to Auscultation, no Crackles, no wheezes Abdomen: Bowel Sound present, Soft and no tenderness Skin: facial redness, no Rash, bilateral  facial induration Extremities: no Pedal edema, no calf tenderness Neurologic: Grossly no focal neuro deficit. Bilaterally Equal motor strength  Data Reviewed: CBC:  Recent Labs Lab 05/28/16 2042 05/29/16 0517  WBC 16.0* 11.2*  NEUTROABS 12.6*  --   HGB 16.5 13.8  HCT 48.6 43.0  MCV 91.0 92.1  PLT 264 229   Basic Metabolic Panel:  Recent Labs Lab 05/28/16 1945 05/29/16 0517  NA 134* 139  K 4.2 4.0  CL 101 108  CO2 24 26  GLUCOSE 102* 96  BUN 14 14  CREATININE 1.35* 1.23  CALCIUM 8.6* 7.4*    Liver Function Tests:  Recent Labs Lab 05/28/16 1945  AST 24  ALT 19  ALKPHOS 103  BILITOT 1.0  PROT 8.0  ALBUMIN 3.9   No results for input(s): LIPASE, AMYLASE in the last 168 hours. No results for input(s): AMMONIA in the last 168 hours. Coagulation Profile: No results for input(s): INR, PROTIME in the last 168 hours. Cardiac Enzymes: No results for input(s): CKTOTAL, CKMB, CKMBINDEX, TROPONINI in the last 168 hours. BNP (last 3 results) No results for input(s): PROBNP in the last 8760 hours. CBG: No results for input(s): GLUCAP in the last 168 hours. Studies: Ct Maxillofacial W Contrast  Result Date: 05/29/2016 CLINICAL DATA:  Facial cellulitis with redness extending from the bridge of the nose to the bilateral maxillary areas, treated with antibiotics. EXAM: CT MAXILLOFACIAL WITH CONTRAST TECHNIQUE: Multidetector CT imaging of the maxillofacial structures was performed. Multiplanar CT image reconstructions were also generated. A small metallic BB was placed on the right temple in order to reliably differentiate right from left. CONTRAST:  75mL ISOVUE-300 IOPAMIDOL (ISOVUE-300) INJECTION 61% COMPARISON:  Neck CT dated 07/07/2015. FINDINGS: Osseous: Multiple bilateral dental metallic fillings with associated streak artifacts. No cavities or periapical lucencies seen today. Orbits: Normal. Sinuses: Small left maxillary sinus retention cyst. Soft tissues: Mild diffuse  subcutaneous edema in the lateral lower face bilaterally. No fluid collections. Limited intracranial: Normal. IMPRESSION: Mild bilateral facial cellulitis without abscess. Electronically Signed   By: Beckie Salts M.D.   On: 05/29/2016 13:34    Scheduled Meds: . DULoxetine  120 mg Oral Daily  . enoxaparin (LOVENOX) injection  60 mg Subcutaneous QHS  . liothyronine  25 mcg Oral Daily  . mirtazapine  30 mg Oral QHS  . piperacillin-tazobactam (ZOSYN)  IV  3.375 g Intravenous Q8H  . vancomycin  1,000 mg Intravenous Q12H   Continuous Infusions: PRN Meds: acetaminophen **OR** acetaminophen, hydrALAZINE  Time spent: 30 minutes  Author: Lynden Oxford, MD [facility] Pager: [phone] 05/29/2016 4:19 PM  If 7PM-7AM, please contact night-coverage at [email or web address], password [facility]

## 2016-05-30 DIAGNOSIS — A419 Sepsis, unspecified organism: Principal | ICD-10-CM

## 2016-05-30 DIAGNOSIS — G4733 Obstructive sleep apnea (adult) (pediatric): Secondary | ICD-10-CM

## 2016-05-30 DIAGNOSIS — I10 Essential (primary) hypertension: Secondary | ICD-10-CM

## 2016-05-30 DIAGNOSIS — L03211 Cellulitis of face: Secondary | ICD-10-CM

## 2016-05-30 LAB — COMPREHENSIVE METABOLIC PANEL
ALK PHOS: 87 U/L (ref 38–126)
ALT: 15 U/L — ABNORMAL LOW (ref 17–63)
ANION GAP: 7 (ref 5–15)
AST: 21 U/L (ref 15–41)
Albumin: 3.2 g/dL — ABNORMAL LOW (ref 3.5–5.0)
BILIRUBIN TOTAL: 0.9 mg/dL (ref 0.3–1.2)
BUN: 12 mg/dL (ref 6–20)
CALCIUM: 8.1 mg/dL — AB (ref 8.9–10.3)
CO2: 24 mmol/L (ref 22–32)
Chloride: 105 mmol/L (ref 101–111)
Creatinine, Ser: 1.13 mg/dL (ref 0.61–1.24)
GFR calc non Af Amer: >60 mL/min (ref >60)
GLUCOSE: 127 mg/dL — AB (ref 65–99)
Potassium: 4.7 mmol/L (ref 3.5–5.1)
Sodium: 136 mmol/L (ref 135–145)
TOTAL PROTEIN: 6.9 g/dL (ref 6.5–8.1)

## 2016-05-30 LAB — CBC WITH DIFFERENTIAL/PLATELET
Basophils Absolute: 0 10*3/uL (ref 0.0–0.1)
Basophils Relative: 0 %
Eosinophils Absolute: 0.4 10*3/uL (ref 0.0–0.7)
Eosinophils Relative: 4 %
HEMATOCRIT: 44.3 % (ref 39.0–52.0)
HEMOGLOBIN: 14.8 g/dL (ref 13.0–17.0)
LYMPHS ABS: 1.9 10*3/uL (ref 0.7–4.0)
Lymphocytes Relative: 19 %
MCH: 30.5 pg (ref 26.0–34.0)
MCHC: 33.4 g/dL (ref 30.0–36.0)
MCV: 91.2 fL (ref 78.0–100.0)
MONOS PCT: 13 %
Monocytes Absolute: 1.3 10*3/uL — ABNORMAL HIGH (ref 0.1–1.0)
NEUTROS ABS: 6.5 10*3/uL (ref 1.7–7.7)
NEUTROS PCT: 64 %
Platelets: 254 10*3/uL (ref 150–400)
RBC: 4.86 MIL/uL (ref 4.22–5.81)
RDW: 12.8 % (ref 11.5–15.5)
WBC: 10.2 10*3/uL (ref 4.0–10.5)

## 2016-05-30 MED ORDER — LISINOPRIL 20 MG PO TABS
20.0000 mg | ORAL_TABLET | Freq: Every day | ORAL | Status: DC
  Administered 2016-05-30 – 2016-05-31 (×2): 20 mg via ORAL
  Filled 2016-05-30 (×2): qty 1

## 2016-05-30 NOTE — Progress Notes (Signed)
[facility] Progress Note  Patient: Kelly Johnston ZOX:[id number]   PCP: PROVIDER NOT IN SYSTEM DOB: 1971-01-19   DOA: 05/28/2016   DOS: 05/30/2016   Date of Service: the patient was seen and examined on 05/30/2016  Subjective: Patient continues to have significant swelling of his face as well as redness of the surrounding area.  Brief hospital course: Pt. with PMH of hypertension, hypothyroidism, sleep apnea; admitted on 05/28/2016, with complaint of facial redness, was found to have facial cellulitis. Patient noted redness around his nasal bridge from his C Pap mask few days ago. Over a period of time of 3 days it has progressed to involve his both cheeks and he started having fever and pain in his neck and therefore came to the hospital. Currently further plan is continue IV antibiotics.  Assessment and Plan: Sepsis (HCC) due to facial cellulitis. With leukocytosis and fever and tachycardia patient meeting criteria, since resolved. CT maxillofacial showed no abscess or orbital involvement. H/o MRSA SSTI in the past. No concern for invasive fungal infection. - Continue abx with MRSA coverage, though cellulitis is non-purulent. Will cover for strep and staph at discharge, or based on blood culture data.  - Given malar distribution, consider checking ANA   Essential hypertension: Blood pressure stable, no longer worried about sepsis.  - Restart home BP medication  Hypothyroidism: Chronic, stable - Continue Synthroid.  Obstructive sleep apnea: Chronic, stable.  - Continue CPAP qHS  - Recommend frequent CPAP hygiene at home  Bowel regimen: last BM 05/29/2016 Diet: cardiac diet DVT Prophylaxis: subcutaneous heparin  Advance goals of care discussion: Full code  Family Communication: None at bedside this AM  Disposition:  Discharge to home anticipated in next 24 - 48 hours based on improvement in cellulitis and negative blood cultures at 48 hours.    Consultants: none Procedures:  none Antibiotics: Vancomycin and zosyn 4/2 >>   Objective: BP (!) 134/93 (BP Location: Right Arm)   Pulse 88   Temp 97.5 F (36.4 C) (Oral)   Resp 15   Ht  (1.93 m)   Wt 131.5 kg (290 lb)   SpO2 98%   BMI 35.30 kg/m   General: Obese, pleasant male resting quietly in no distress.   Eyes: PERRL, full painless EOMI, no conjunctival injection or proptosis. ENT: No necrosis, nares patent, oropharynx clear without lesions on mucous membranes.  Neck: Supple, no thyromegaly or JVD Cardiovascular: RRR, no murmurs.  Respiratory: Nonlabored, clear Abdomen: Soft, obese, nontender, nondistended. +BS. Skin: Tender, indurated, blanchable, facial erythema in malar distribution extending to preauricular area bilaterally with indistinct margins. No involvement of forehead or mandibular areas. No fluctuance noted.  Extremities: No deformities. No calf tenderness. No joint effusions.  Neurologic: Alert, oriented, speech and gait normal. No focal deficits.   Data Reviewed: CBC:  Recent Labs Lab 05/28/16 2042 05/29/16 0517 05/30/16 0409  WBC 16.0* 11.2* 10.2  NEUTROABS 12.6*  --  6.5  HGB 16.5 13.8 14.8  HCT 48.6 43.0 44.3  MCV 91.0 92.1 91.2  PLT 264 229 254   Basic Metabolic Panel:  Recent Labs Lab 05/28/16 1945 05/29/16 0517 05/30/16 0409  NA 134* 139 136  K 4.2 4.0 4.7  CL 101 108 105  CO2 GLUCOSE 102* 96 127*  BUN CREATININE 1.35* 1.23 1.13  CALCIUM 8.6* 7.4* 8.1*    Liver Function Tests:  Recent Labs Lab 05/28/16 1945 05/30/16 0409  AST 24 21  ALT 19  15*  ALKPHOS 103 87  BILITOT 1.0 0.9  PROT 8.0 6.9  ALBUMIN 3.9 3.2*   Studies: Ct Maxillofacial W Contrast  Result Date: 05/29/2016 CLINICAL DATA:  Facial cellulitis with redness extending from the bridge of the nose to the bilateral maxillary areas, treated with antibiotics. EXAM: CT MAXILLOFACIAL WITH CONTRAST TECHNIQUE: Multidetector CT imaging of the maxillofacial structures was  performed. Multiplanar CT image reconstructions were also generated. A small metallic BB was placed on the right temple in order to reliably differentiate right from left. CONTRAST:  75mL ISOVUE-300 IOPAMIDOL (ISOVUE-300) INJECTION 61% COMPARISON:  Neck CT dated 07/07/2015. FINDINGS: Osseous: Multiple bilateral dental metallic fillings with associated streak artifacts. No cavities or periapical lucencies seen today. Orbits: Normal. Sinuses: Small left maxillary sinus retention cyst. Soft tissues: Mild diffuse subcutaneous edema in the lateral lower face bilaterally. No fluid collections. Limited intracranial: Normal. IMPRESSION: Mild bilateral facial cellulitis without abscess. Electronically Signed   By: Beckie Salts M.D.   On: 05/29/2016 13:34    Scheduled Meds: . DULoxetine  120 mg Oral Daily  . enoxaparin (LOVENOX) injection  60 mg Subcutaneous QHS  . liothyronine  25 mcg Oral Daily  . mirtazapine  30 mg Oral QHS  . piperacillin-tazobactam (ZOSYN)  IV  3.375 g Intravenous Q8H  . vancomycin  1,000 mg Intravenous Q12H   Time spent: 25 minutes  Hazeline Junker, MD [facility] Pager: [phone]  05/30/2016 11:13 AM    If 7PM-7AM, please contact night-coverage at [email or web address], password [facility]

## 2016-05-31 DIAGNOSIS — E039 Hypothyroidism, unspecified: Secondary | ICD-10-CM

## 2016-05-31 MED ORDER — CEPHALEXIN 500 MG PO CAPS: 500.0000 mg | ORAL_CAPSULE | Freq: Four times a day (QID) | ORAL | 0 refills | Status: DC

## 2016-05-31 MED ORDER — OLOPATADINE HCL 0.1 % OP SOLN
1.0000 [drp] | Freq: Two times a day (BID) | OPHTHALMIC | Status: DC
  Filled 2016-05-31: qty 5

## 2016-05-31 MED ORDER — DOXYCYCLINE HYCLATE 100 MG PO CAPS: 100.0000 mg | ORAL_CAPSULE | Freq: Two times a day (BID) | ORAL | 0 refills | Status: DC

## 2016-05-31 NOTE — Discharge Summary (Signed)
Physician Discharge Summary  CINCH ORMOND ZOX:[id number] DOB: 04/30/1970 DOA: 05/28/2016  PCP: Elias Else, per pt.  Admit date: 05/28/2016 Discharge date: 05/31/2016  Admitted From: Home Disposition: Home   Recommendations for Outpatient Follow-up:  1. Follow up with PCP in 1-2 weeks. Discharged to complete 7 days total of antibiotics (doxycycline, keflex) for uncomplicated facial cellulitis.  2. Consider HbA1c (none in our system). 3. Please follow up on blood cultures, no growth to date at time of discharge.  Home Health: None Equipment/Devices: None Discharge Condition: Stable CODE STATUS: Full Diet recommendation: Heart healthy  Brief/Interim Summary: Kelly Johnston is a 46 y.o. male with PMH of hypertension, hypothyroidism, sleep apnea and MRSA cellulitis; admitted on 05/28/2016, with complaint of facial redness, was found to have facial cellulitis. Patient noted redness around his nasal bridge from his CPAP mask few days prior. Over a period of time of 3 days it has progressed to involve his both cheeks and he started having fever and pain in his neck and therefore came to the hospital. Maxillofacial CT showed no abscess or orbital involvement. IV antibiotics were given with resolution of leukocytosis, fever, and improvement of facial erythema/tenderness. He will be discharged on antibiotics to cover MRSA and strep with plans to follow up with PCP.  Discharge Diagnoses:  Principal Problem:   Sepsis (HCC) Active Problems:   Depression   Facial cellulitis   Essential hypertension   Sleep apnea   Hypothyroidism  Sepsis due to facial cellulitis: With leukocytosis and fever and tachycardia patient meeting criteria, sepsis has since resolved. CT maxillofacial showed no abscess or orbital involvement. H/o MRSA SSTI in the past. No concern for invasive fungal infection. - Continue abx with MRSA coverage, though cellulitis is non-purulent. Will cover for strep and MRSA at discharge -  Follow pending blood cultures - Given malar distribution, consider checking ANA, though rash is resolving on abx alone.  Essential hypertension: Blood pressure stable, no longer worried about sepsis.  - Restarted home BP medication  Hypothyroidism: Chronic, stable - Continued liothyronine  Obstructive sleep apnea: Chronic, stable.  - Continued CPAP qHS  - Recommend frequent CPAP hygiene at home  Discharge Instructions Discharge Instructions    Call MD for:  severe uncontrolled pain    Complete by:  As directed    Call MD for:  temperature >100.4    Complete by:  As directed    Discharge instructions    Complete by:  As directed    You were admitted with facial cellulitis which has improved on IV antibiotics. You had symptoms of a body-wide infection which have resolved. You are stable for discharge with the following recommendations:  - Continue taking antibiotics for 4 more days, keflex 4 times daily and doxycycline twice daily have been sent to your pharmacy.  - You may take ibuprofen and/or tylenol for discomfort.  - If symptoms begin to worsen, seek medical attention right away, otherwise follow up with your PCP in the next week for recheck.     Allergies as of 05/31/2016      Reactions   Latex Rash      Medication List    TAKE these medications   acetaminophen 500 MG tablet Commonly known as:  TYLENOL Take 1,000 mg by mouth every 6 (six) hours as needed for headache (headache).   cephALEXin 500 MG capsule Commonly known as:  KEFLEX Take 1 capsule (500 mg total) by mouth 4 (four) times daily.   doxycycline 100 MG capsule Commonly  known as:  VIBRAMYCIN Take 1 capsule (100 mg total) by mouth 2 (two) times daily.   DULoxetine 60 MG capsule Commonly known as:  CYMBALTA Take 120 mg by mouth daily.   liothyronine 25 MCG tablet Commonly known as:  CYTOMEL Take 25 mcg by mouth daily.   lisinopril 20 MG tablet Commonly known as:  PRINIVIL,ZESTRIL Take 20 mg by  mouth daily.   mirtazapine 30 MG tablet Commonly known as:  REMERON Take 30 mg by mouth at bedtime.   QUEtiapine 100 MG tablet Commonly known as:  SEROQUEL Take 50 mg by mouth at bedtime as needed for sleep.   sildenafil 100 MG tablet Commonly known as:  VIAGRA Take 1 tablet (100 mg total) by mouth as needed for erectile dysfunction. PATIENT NEEDS OFFICE VISIT FOR ADDITIONAL REFILLS   Testosterone 40.5 MG/2.5GM (1.62%) Gel Commonly known as:  ANDROGEL Four pumps daily applied to chest and shoulders.PATIENT NEEDS OFFICE VISIT FOR ADDITIONAL REFILLS      Follow-up Information    Lolita Patella, MD Follow up.   Specialty:  Family Medicine Contact information: [phone] [address] [place] [address] [phone]          Allergies  Allergen Reactions  . Latex Rash    Consultations:  None  Procedures/Studies: Ct Maxillofacial W Contrast  Result Date: 05/29/2016 CLINICAL DATA:  Facial cellulitis with redness extending from the bridge of the nose to the bilateral maxillary areas, treated with antibiotics. EXAM: CT MAXILLOFACIAL WITH CONTRAST TECHNIQUE: Multidetector CT imaging of the maxillofacial structures was performed. Multiplanar CT image reconstructions were also generated. A small metallic BB was placed on the right temple in order to reliably differentiate right from left. CONTRAST:  75mL ISOVUE-300 IOPAMIDOL (ISOVUE-300) INJECTION 61% COMPARISON:  Neck CT dated 07/07/2015. FINDINGS: Osseous: Multiple bilateral dental metallic fillings with associated streak artifacts. No cavities or periapical lucencies seen today. Orbits: Normal. Sinuses: Small left maxillary sinus retention cyst. Soft tissues: Mild diffuse subcutaneous edema in the lateral lower face bilaterally. No fluid collections. Limited intracranial: Normal. IMPRESSION: Mild bilateral facial cellulitis without abscess. Electronically Signed   By: Beckie Salts M.D.   On: 05/29/2016 13:34    Subjective: Pt with improving, mild facial discomfort without blurry vision, double vision, eye pain, ocular FB sensation. Some eye redness intermittently with frequent itchiness and watery discharge. No fevers, chills.   Discharge Exam: BP 133/79 (BP Location: Right Arm)   Pulse 97   Temp 98.2 F (36.8 C) (Oral)   Resp 16   Ht  (1.93 m)   Wt 131.5 kg (290 lb)   SpO2 100%   BMI 35.30 kg/m   General: Obese, pleasant male resting quietly in no distress.   Eyes: PERRL, full painless EOMI, no conjunctival injection or proptosis. ENT: No necrosis, nares patent, oropharynx clear without lesions on mucous membranes. Cardiovascular: RRR, no murmurs.  Respiratory: Nonlabored, clear Skin: Tender, indurated, blanchable, facial erythema in malar distribution extending to preauricular area bilaterally with indistinct margins. No involvement of forehead or mandibular areas. No fluctuance noted.   Labs: Basic Metabolic Panel:  Recent Labs Lab 05/28/16 1945 05/29/16 0517 05/30/16 0409  NA 134* 139 136  K 4.2 4.0 4.7  CL 101 108 105  CO2 GLUCOSE 102* 96 127*  BUN CREATININE 1.35* 1.23 1.13  CALCIUM 8.6* 7.4* 8.1*   Liver Function Tests:  Recent Labs Lab 05/28/16 1945 05/30/16 0409  AST 24 21  ALT  19 15*  ALKPHOS 103 87  BILITOT 1.0 0.9  PROT 8.0 6.9  ALBUMIN 3.9 3.2*   CBC:  Recent Labs Lab 05/28/16 2042 05/29/16 0517 05/30/16 0409  WBC 16.0* 11.2* 10.2  NEUTROABS 12.6*  --  6.5  HGB 16.5 13.8 14.8  HCT 48.6 43.0 44.3  MCV 91.0 92.1 91.2  PLT 264 229 254   Urinalysis    Component Value Date/Time   COLORURINE YELLOW 05/28/2016 2033   APPEARANCEUR CLEAR 05/28/2016 2033   LABSPEC 1.018 05/28/2016 2033   PHURINE 5.0 05/28/2016 2033   GLUCOSEU NEGATIVE 05/28/2016 2033   HGBUR SMALL (A) 05/28/2016 2033   BILIRUBINUR NEGATIVE 05/28/2016 2033   KETONESUR 20 (A) 05/28/2016 2033   PROTEINUR 30 (A) 05/28/2016 2033   UROBILINOGEN 0.2  04/30/2014 1250   NITRITE NEGATIVE 05/28/2016 2033   LEUKOCYTESUR NEGATIVE 05/28/2016 2033    Microbiology Recent Results (from the past 240 hour(s))  Culture, blood (Routine X 2) w Reflex to ID Panel     Status: None (Preliminary result)   Collection Time: 05/28/16  8:42 PM  Result Value Ref Range Status   Specimen Description BLOOD RIGHT HAND  Final   Special Requests IN PEDIATRIC BOTTLE Blood Culture adequate volume  Final   Culture   Final    NO GROWTH 1 DAY Performed at [facility], [address], [place], [place] [address]    Report Status PENDING  Incomplete  Culture, blood (Routine X 2) w Reflex to ID Panel     Status: None (Preliminary result)   Collection Time: 05/28/16  8:45 PM  Result Value Ref Range Status   Specimen Description BLOOD LEFT ANTECUBITAL  Final   Special Requests   Final    BOTTLES DRAWN AEROBIC AND ANAEROBIC Blood Culture adequate volume   Culture   Final    NO GROWTH 1 DAY Performed at [facility], [address] [place], [place] [address]    Report Status PENDING  Incomplete  MRSA PCR Screening     Status: None   Collection Time: 05/28/16 10:58 PM  Result Value Ref Range Status   MRSA by PCR NEGATIVE NEGATIVE Final    Comment:        The GeneXpert MRSA Assay (FDA approved for NASAL specimens only), is one component of a comprehensive MRSA colonization surveillance program. It is not intended to diagnose MRSA infection nor to guide or monitor treatment for MRSA infections.     Time coordinating discharge: Approximately 40 minutes  Hazeline Junker, MD  [facility] 05/31/2016, 11:25 AM Pager [phone]

## 2016-05-31 NOTE — Progress Notes (Signed)
Patient discharged to home with family, discharge instructions reviewed with patient who verbalized understanding. 

## 2016-06-03 LAB — CULTURE, BLOOD (ROUTINE X 2)
Culture: NO GROWTH
Culture: NO GROWTH
SPECIAL REQUESTS: ADEQUATE
Special Requests: ADEQUATE

## 2016-09-11 DIAGNOSIS — F3341 Major depressive disorder, recurrent, in partial remission: Secondary | ICD-10-CM | POA: Diagnosis not present

## 2017-01-27 IMAGING — CT CT NECK W/ CM
2 of 3 series · 8 of 14 positions shown, 9 images · IV contrast (agent unspecified)
Comparison: Neck soft tissue ultrasound 07/30/2014

CLINICAL DATA: 45-year-old male with left neck mass discovered 1
year ago. Subsequent encounter.

Creatinine was obtained on site at [HOSPITAL] at [HOSPITAL].
Results: Creatinine 1.2 mg/dL.
EXAM:
CT NECK WITH CONTRAST
TECHNIQUE: Multidetector CT imaging of the neck was performed using the
standard protocol following the bolus administration of intravenous
contrast.
CONTRAST:  75 mL Fsovue-C33

[Series 2: neck · axial · 0.52mm/px · z∈[-267,-96]mm · 4 of 96 slices shown, 5 images]
[im 20/96  soft-tissue]
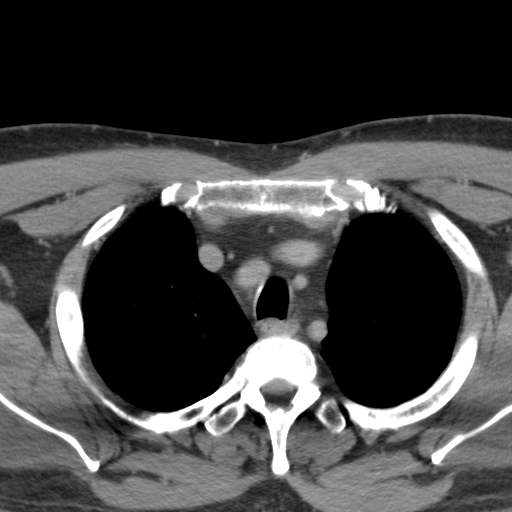
[im 20/96  bone]
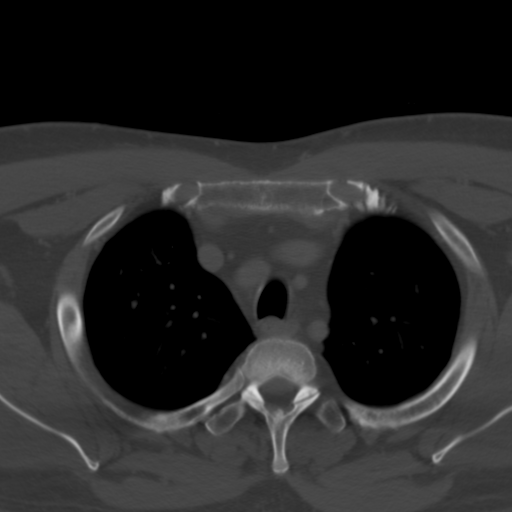
[im 39/96  bone]
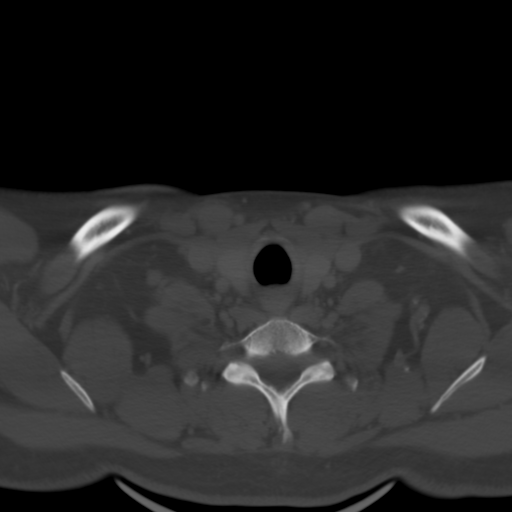
[im 58/96  bone]
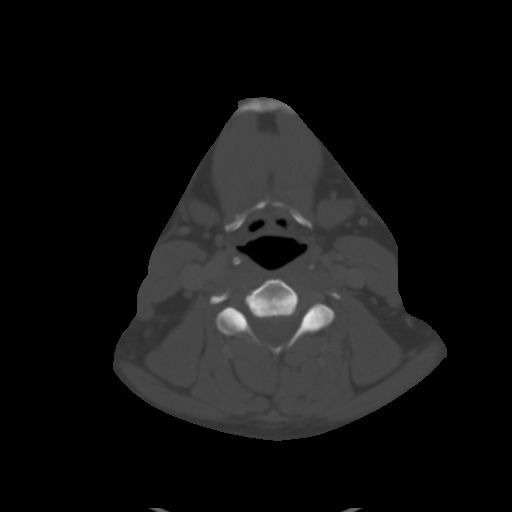
[im 77/96  bone]
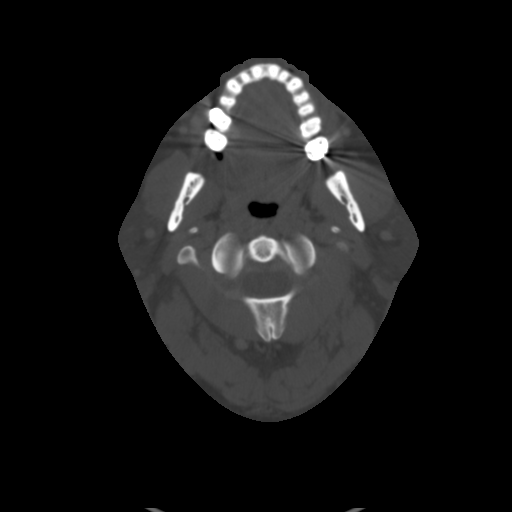

[Series 6: angled axial neck · axial · 0.39mm/px · z∈[-286,-119]mm · 4 of 97 slices shown]
[im 20/97  bone]
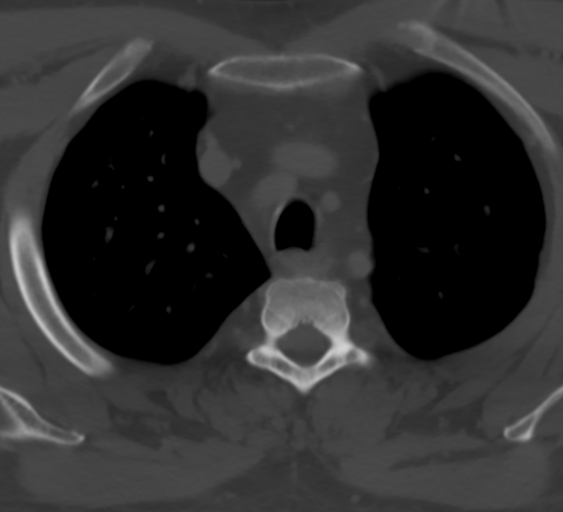
[im 39/97  bone]
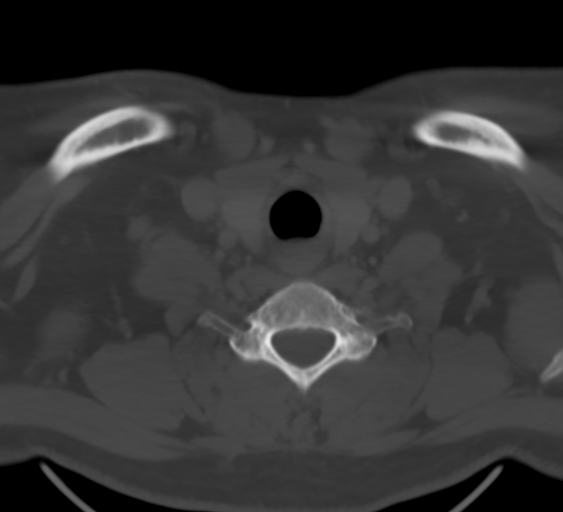
[im 58/97  bone]
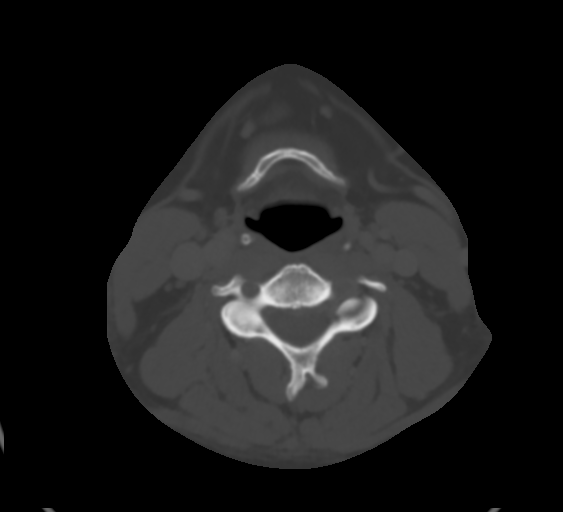
[im 77/97  bone]
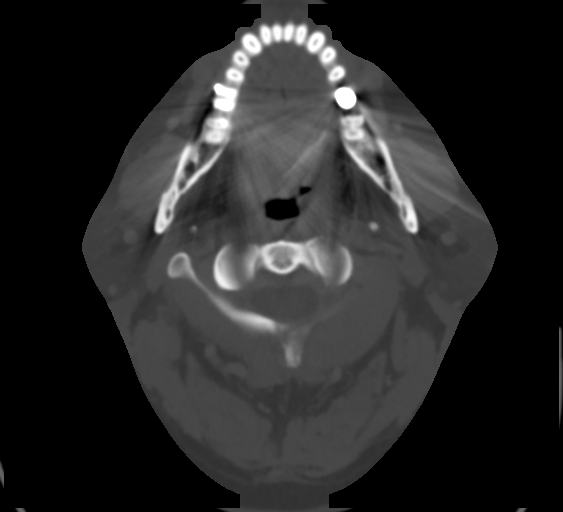

[8 of 14 positions shown; findings below may reference images not displayed]

FINDINGS: Pharynx and larynx: Negative. Negative parapharyngeal and
retropharyngeal spaces.

Salivary glands: Negative sub lingual space. Both submandibular
glands and parotid glands appear normal.

Situated between the left parotid space and left submandibular space
along the anterior margin of the left sternocleidomastoid muscle
there is a fat density mass corresponding to the palpable area of
concern. This encompasses 35 x 28 x 38 mm (AP by transverse by CC) .
The left retromandibular vein courses through the lesion obliquely,
and there is a very small nodular soft tissue component along the
posterior margin (series 2, image 29). No surrounding inflammation.

Thyroid: Negative.

Lymph nodes: Negative. Bilateral cervical lymph nodes are within
normal limits.

Vascular: Suboptimal intra vascular contrast bolus. Major vascular
structures in the neck appear grossly patent.

Limited intracranial: Negative. Dominant appearing distal left
vertebral artery.

Visualized orbits: Negative.

Mastoids and visualized paranasal sinuses: Small left maxillary
sinus mucous retention cysts, otherwise clear.

Skeleton:  No acute osseous abnormality identified.

Upper chest: Negative superior mediastinum. Negative visualized lung
apices. No axillary lymphadenopathy.
IMPRESSION: 1. Palpable corresponds to a lipoma measuring 3.8 cm situated along
the anterior margin of the left sternocleidomastoid muscle. The
retromandibular vein courses through the lesion.
There is a small area of complex soft tissue nodularity along the
posterior margin of this lipoma. If surgical resection is not
desirable, then consider a noncontrast neck CT follow-up of the
lesion (e.g. in 2 years) to document stability (and stability at
that time should obviate additional imaging follow-up).
2. Otherwise negative neck CT.

## 2017-04-26 DIAGNOSIS — F3341 Major depressive disorder, recurrent, in partial remission: Secondary | ICD-10-CM | POA: Diagnosis not present

## 2017-05-10 DIAGNOSIS — F3341 Major depressive disorder, recurrent, in partial remission: Secondary | ICD-10-CM | POA: Diagnosis not present

## 2017-07-04 DIAGNOSIS — F3341 Major depressive disorder, recurrent, in partial remission: Secondary | ICD-10-CM | POA: Diagnosis not present

## 2017-08-30 DIAGNOSIS — F3341 Major depressive disorder, recurrent, in partial remission: Secondary | ICD-10-CM | POA: Diagnosis not present

## 2017-09-25 DIAGNOSIS — F3341 Major depressive disorder, recurrent, in partial remission: Secondary | ICD-10-CM | POA: Diagnosis not present

## 2017-10-25 DIAGNOSIS — F3341 Major depressive disorder, recurrent, in partial remission: Secondary | ICD-10-CM | POA: Diagnosis not present

## 2017-11-06 DIAGNOSIS — Z79899 Other long term (current) drug therapy: Secondary | ICD-10-CM | POA: Diagnosis not present

## 2017-11-06 DIAGNOSIS — F332 Major depressive disorder, recurrent severe without psychotic features: Secondary | ICD-10-CM | POA: Diagnosis not present

## 2017-11-06 DIAGNOSIS — E559 Vitamin D deficiency, unspecified: Secondary | ICD-10-CM | POA: Diagnosis not present

## 2017-11-19 ENCOUNTER — Encounter: Payer: Self-pay | Admitting: *Deleted

## 2017-11-28 ENCOUNTER — Other Ambulatory Visit: Payer: Self-pay

## 2017-11-28 MED ORDER — MIRTAZAPINE 30 MG PO TABS: 30.0000 mg | ORAL_TABLET | Freq: Every day | ORAL | 0 refills | Status: DC

## 2017-11-28 MED ORDER — LITHIUM CARBONATE 300 MG PO CAPS: 300.0000 mg | ORAL_CAPSULE | Freq: Three times a day (TID) | ORAL | 0 refills | Status: DC

## 2017-11-29 ENCOUNTER — Other Ambulatory Visit: Payer: Self-pay | Admitting: Psychiatry

## 2017-12-03 ENCOUNTER — Ambulatory Visit (INDEPENDENT_AMBULATORY_CARE_PROVIDER_SITE_OTHER): Payer: BLUE CROSS/BLUE SHIELD | Admitting: Psychiatry

## 2017-12-03 DIAGNOSIS — R7989 Other specified abnormal findings of blood chemistry: Secondary | ICD-10-CM

## 2017-12-03 DIAGNOSIS — F411 Generalized anxiety disorder: Secondary | ICD-10-CM | POA: Diagnosis not present

## 2017-12-03 DIAGNOSIS — F332 Major depressive disorder, recurrent severe without psychotic features: Secondary | ICD-10-CM

## 2017-12-03 MED ORDER — VITAMIN D (ERGOCALCIFEROL) 1.25 MG (50000 UNIT) PO CAPS: 50000.0000 [IU] | ORAL_CAPSULE | ORAL | 2 refills | Status: DC

## 2017-12-03 MED ORDER — LITHIUM CARBONATE 300 MG PO CAPS: 1200.0000 mg | ORAL_CAPSULE | Freq: Every day | ORAL | 2 refills | Status: DC

## 2017-12-03 MED ORDER — LIOTHYRONINE SODIUM 25 MCG PO TABS: 50.0000 ug | ORAL_TABLET | Freq: Every day | ORAL | 0 refills | Status: DC

## 2017-12-03 NOTE — Progress Notes (Signed)
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  Changed Rexulti to Vraylar without sig change in sx.  Past few weeks up and down with hard days.  Still a lot of depression and anxiety especially at night worry and fear of future.  Disappointment with self and life.  No death thoughts.  Diff fctn at work. The depression is moderately severe.  Sleep varies.  Individual Medical History/ Review of Systems: Changes? :Yes increase weight over months. Low vitamin D  Past med trials include lithium, venlafaxine, duloxetine 120 mg, fluoxetine 60 mg, bupropion 450 mg, aripiprazole, Viibryd, Rexulti 2 mg   Allergies: Latex  Current Medications:  Current Outpatient Medications:  .  acetaminophen (TYLENOL) 500 MG tablet, Take 1,000 mg by mouth every 6 (six) hours as needed for headache (headache)., Disp: , Rfl:  .  ALPRAZolam (XANAX) 0.5 MG tablet, Take 0.5 mg by mouth 3 (three) times daily., Disp: , Rfl: 1 .  cariprazine (VRAYLAR) capsule, Take 1.5 mg by mouth daily., Disp: , Rfl:  .  cholecalciferol (VITAMIN D) 1000 units tablet, Take 2,000 Units by mouth daily., Disp: , Rfl:  .  DULoxetine (CYMBALTA) 60 MG capsule, Take 120 mg by mouth daily. , Disp: , Rfl:  .  liothyronine (CYTOMEL) 25 MCG tablet, Take 2 tablets (50 mcg total) by mouth daily., Disp: 90 tablet, Rfl: 0 .  lithium carbonate 300 MG capsule, Take 4 capsules (1,200 mg total) by mouth at bedtime. Keep appointment as scheduled., Disp: 120 capsule, Rfl: 2 .  mirtazapine (REMERON) 45 MG tablet, Take 45 mg by mouth at bedtime., Disp: , Rfl: 0 .  sildenafil (VIAGRA) 100 MG tablet, Take 1 tablet (100 mg total) by mouth as needed for erectile dysfunction. PATIENT NEEDS OFFICE VISIT FOR  ADDITIONAL REFILLS, Disp: 10 tablet, Rfl: 0 .  cephALEXin (KEFLEX) 500 MG capsule, Take 1 capsule (500 mg total) by mouth 4 (four) times daily. (Patient not taking: Reported on 12/03/2017), Disp: 16 capsule, Rfl: 0 .  doxycycline (VIBRAMYCIN) 100 MG capsule, Take 1 capsule (100 mg total) by mouth 2 (two) times daily. (Patient not taking: Reported on 12/03/2017), Disp: 8 capsule, Rfl: 0 .  lisinopril (PRINIVIL,ZESTRIL) 20 MG tablet, Take 20 mg by mouth daily., Disp: , Rfl:  .  Testosterone (ANDROGEL) 40.5 MG/2.5GM (1.62%) GEL, Four pumps daily applied to chest and shoulders.PATIENT NEEDS OFFICE VISIT FOR ADDITIONAL REFILLS (Patient not taking: Reported on 05/28/2016), Disp: 150 g, Rfl: 0 .  [START ON 12/05/2017] Vitamin D, Ergocalciferol, (DRISDOL) 50000 units CAPS capsule, Take 1 capsule (50,000 Units total) by mouth 2 (two) times a week., Disp: 8 capsule, Rfl: 2 Medication Side Effects: None  Family Medical/ Social History: Changes? 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  Labs:  Lab Results  Component Value Date   NA 136 05/30/2016   BUN 12 05/30/2016   CREATININE 1.13 05/30/2016   TSH 0.545 01/13/2012   WBC 10.2 05/30/2016   This SmartLink has not been configured with any valid records.  ` Vitamin D level was 15.4.  My target level and him is about 50. Lithium level was 0.4 at 900 mg a day.  MENTAL HEALTH EXAM:  There were no vitals taken for this visit.There is  no height or weight on file to calculate BMI.  General Appearance: Casual  Eye Contact:  Fair  Speech:  reduced quantity  Volume:  Normal  Mood:  Anxious, Depressed, Hopeless and Worthless  Affect:  Blunt and Constricted  Thought Process:  Goal Directed  Orientation:  Full (Time, Place, and Person)  Thought Content: Rumination   Suicidal Thoughts:  No  Homicidal Thoughts:  No  Memory:  Recent  Judgement:  Fair  Insight:  Fair  Psychomotor Activity:  Normal  Concentration:   Concentration: Fair  Recall:  Good  Fund of Knowledge: Good  Language: Good  Akathisia:  Negative  AIMS (if indicated): not done  Assets:  Desire for Improvement Leisure Time Vocational/Educational  ADL's:  Intact  Cognition: WNL  Prognosis:  Fair    DIAGNOSES:    ICD-10-CM   1. Severe episode of recurrent major depressive disorder, without psychotic features (HCC) F33.2   2. Generalized anxiety disorder F41.1   3. Low serum vitamin D R79.89     RECOMMENDATIONS:  Reviewed labs with patient  Vitamin D increase to 50K twice weekly for 8 weeks and repeat level then.  Low vitamin D has been shown to increase the risk of depression and dementia.  It may have contributed to his relapse.  Increase cytomel to 50 mcg for augmentation of depression tx.  Again discussed repeat ECT at length. Prior benefit.  He'll consider.  Increase Vraylar off label for depression to 3 mg.  Samples given. Since noticed no effect for rumination.  Discussed the risk of TD.  Because of mild psychotic rumination he may need this higher dosage.  Increase lithium to 1200mg  /HS. for severe depression.  He may need more extensive psychotherapy as well.  Disc SE.  Call prn  Lauraine Rinne, MD

## 2017-12-03 NOTE — Patient Instructions (Addendum)
  Vitamin D increase to 50K twice weekly for 8 weeks and repeat level then.  Increase cytomel to 50 mcg for augmentation of depression tx.  Increase Vraylar off label for depression to 3 mg.  Samples given  Increase lithium to 1200mg  (4 capsules) at night.  Call if any problems

## 2017-12-23 ENCOUNTER — Telehealth: Payer: Self-pay | Admitting: Psychiatry

## 2017-12-23 NOTE — Telephone Encounter (Signed)
Called and depression is really bad.  NR from changes made at last visit..  Prior depression that was so severe requiring ECT was this time of year also.  His vitamin D level is low.  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 Prior ECT was at [facility] 2012.  He agrees to referral to ECT at [facility]. Meredith Staggers, Md, DFAPA

## 2017-12-28 ENCOUNTER — Other Ambulatory Visit: Payer: Self-pay | Admitting: Psychiatry

## 2017-12-31 ENCOUNTER — Other Ambulatory Visit: Payer: Self-pay | Admitting: Psychiatry

## 2017-12-31 MED ORDER — MIRTAZAPINE 45 MG PO TABS: 45.0000 mg | ORAL_TABLET | Freq: Every day | ORAL | 0 refills | Status: DC

## 2018-01-06 ENCOUNTER — Other Ambulatory Visit: Payer: Self-pay

## 2018-01-06 ENCOUNTER — Ambulatory Visit: Payer: BLUE CROSS/BLUE SHIELD | Admitting: Psychiatry

## 2018-01-06 ENCOUNTER — Encounter

## 2018-01-06 ENCOUNTER — Encounter: Payer: Self-pay | Admitting: Psychiatry

## 2018-01-06 VITALS — BP 165/112 | HR 97 | Temp 98.7°F | Wt 331.6 lb

## 2018-01-06 DIAGNOSIS — F332 Major depressive disorder, recurrent severe without psychotic features: Secondary | ICD-10-CM

## 2018-01-06 DIAGNOSIS — F411 Generalized anxiety disorder: Secondary | ICD-10-CM | POA: Diagnosis not present

## 2018-01-06 NOTE — Pre-Procedure Instructions (Signed)
This nurse reviewed with the patient the New Patient Instructions. After throughly reviewing the instruction he verbalized understanding. He stated he had no questions regarding the treatment and acknowledged that he had received treatments from [facility] previously. He is now awaiting an appointment from Pre-Admit Testing to complete the lab work and x-ray as ordered from Dr. Toni Amend.

## 2018-01-06 NOTE — Progress Notes (Signed)
ECT: This is an ECT consult for this 47 year old man referred from his outpatient provider.  Patient was on time and cooperative and forthcoming with the interview.  This is a 47 year old man with a history of recurrent depression.  Patient describes current symptoms as a mood that stays down and sad and negative pretty much all the time.  Frequent thoughts of hopelessness.  Low energy.  Lack of interest in most enjoyable activities.  Frequent passive suicidal thoughts but with no intention or plan of acting on it.  No hallucinations no delusions no psychosis.  Symptoms have been severe and present for this episode for at least several months.  Patient does have active outpatient treatment and is on several medicines and has been compliant with all of that.  No substance abuse going on.  Patient does not report single life stresses driving this.  His depression has been going on for more than 2 decades intermittently.  His outpatient psychiatrist has been encouraging him to re-seek ECT as it was very helpful for him in the past.  Social history: Patient is divorced but currently living with his ex-wife.  He says they have a better relationship as friends than they did when they were married.  They have 3 children ages 68 and 58.  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  Medical history: Patient has high blood pressure but recently has been noncompliant with prescribed lisinopril for no clear reason.  No other known active ongoing medical problems outside of the psychiatric.  Substance abuse history: Patient says that he drinks only occasionally.  Not using any other recreational drugs.  Denies any past history of alcohol or drug abuse.  Mental status exam: Neatly dressed and groomed man looks his stated age.  Forthcoming with the interview.  Mood stated as depressed.  Affect constricted and flat.  Thoughts lucid no sign of delusions or loosening of associations.  Patient  endorses suicidal thoughts and fantasies but with no specific plan and no intention of hurting himself and feels that his children have always been enough of a reason to not think of actually trying to kill himself.  Not having any psychotic symptoms.  Patient is alert and oriented x4.  Short and long-term memory intact.  Clearly intelligent and educated and able to exercise appropriate judgment about treatment planning.  Current medication: Angelina Pih 3 mg/day, Xanax 0.5 mg 3 times a day, lithium carbonate 900 mg at night, duloxetine 120 mg/day, Cytomel 25 mcg/day, vitamin D daily, Remeron 45 mg at night.  Prescribed lisinopril but noncompliant.  Family history: Patient is not aware of any family history of mental health problems  ECT history: Patient says that he received ECT at [facility] in 2014.  He has had 2 prior hospitalizations one at [facility] and 1 at another psychiatric hospital.  Neither of them associated with actual suicide attempts.  Patient has been on a wide variety of medications multiple antidepressants and mood stabilizers none of them have been consistently as effective as his course of ECT was.  It was well tolerated and he does not recall any side effects from it.  Assessment: This is a 47 year old man with severe recurrent major depression no history of mania.  No known active substance abuse no psychotic symptoms.  Has been on multiple medication trials without sustained remission and continues to have suicidal thoughts and severe impairment of his functioning.  He has a history of good response to ECT.  Patient is a very good candidate for ECT.  We discussed the practicalities of the treatment plan.  Reviewed specifics of treatment.  Patient given ample opportunity to ask questions and engage in discussion.  He is agreeable to a plan to start either Wednesday or Friday of this week.  Orders were done to get his labs completed.  I will notify the ECT team and the utilization review team..   Patient was asked to please discontinue lithium carbonate to minimize risks of delirium from ECT.  Other medications can remain as they are but I would ask him to not take Xanax in the morning of the treatment.  Patient understands and agrees to the plan.

## 2018-01-08 ENCOUNTER — Other Ambulatory Visit: Payer: Self-pay

## 2018-01-08 ENCOUNTER — Telehealth: Payer: Self-pay | Admitting: Psychiatry

## 2018-01-08 MED ORDER — CARIPRAZINE HCL 3 MG PO CAPS: 3.0000 mg | ORAL_CAPSULE | Freq: Every day | ORAL | 2 refills | Status: DC

## 2018-01-08 NOTE — Telephone Encounter (Signed)
Received Vraylar samples. Need a script for meds please

## 2018-01-09 ENCOUNTER — Other Ambulatory Visit: Payer: Self-pay

## 2018-01-09 ENCOUNTER — Ambulatory Visit
Admission: RE | Admit: 2018-01-09 | Discharge: 2018-01-09 | Disposition: A | Discharge status: Home / Self Care | Payer: BLUE CROSS/BLUE SHIELD | Source: Ambulatory Visit | Attending: Psychiatry | Admitting: Psychiatry

## 2018-01-09 ENCOUNTER — Encounter
Admission: RE | Admit: 2018-01-09 | Discharge: 2018-01-09 | Disposition: A | Discharge status: Home / Self Care | Payer: BLUE CROSS/BLUE SHIELD | Source: Ambulatory Visit | Attending: Psychiatry | Admitting: Psychiatry

## 2018-01-09 ENCOUNTER — Other Ambulatory Visit: Payer: Self-pay | Admitting: Psychiatry

## 2018-01-09 DIAGNOSIS — Z01818 Encounter for other preprocedural examination: Secondary | ICD-10-CM | POA: Insufficient documentation

## 2018-01-09 DIAGNOSIS — F332 Major depressive disorder, recurrent severe without psychotic features: Secondary | ICD-10-CM | POA: Insufficient documentation

## 2018-01-09 DIAGNOSIS — M954 Acquired deformity of chest and rib: Secondary | ICD-10-CM | POA: Insufficient documentation

## 2018-01-09 DIAGNOSIS — Z01810 Encounter for preprocedural cardiovascular examination: Secondary | ICD-10-CM | POA: Diagnosis not present

## 2018-01-09 HISTORY — DX: Anxiety disorder, unspecified: F41.9

## 2018-01-09 HISTORY — DX: Essential (primary) hypertension: I10

## 2018-01-09 HISTORY — DX: Sleep apnea, unspecified: G47.30

## 2018-01-09 LAB — BASIC METABOLIC PANEL
ANION GAP: 7 (ref 5–15)
BUN: 11 mg/dL (ref 6–20)
CO2: 30 mmol/L (ref 22–32)
CREATININE: 1.25 mg/dL — AB (ref 0.61–1.24)
Calcium: 8.9 mg/dL (ref 8.9–10.3)
Chloride: 105 mmol/L (ref 98–111)
GFR calc non Af Amer: >60 mL/min (ref >60)
Glucose, Bld: 94 mg/dL (ref 70–99)
Potassium: 3.9 mmol/L (ref 3.5–5.1)
SODIUM: 142 mmol/L (ref 135–145)

## 2018-01-09 LAB — CBC
HCT: 50.2 % (ref 39.0–52.0)
Hemoglobin: 16.2 g/dL (ref 13.0–17.0)
MCH: 30.6 pg (ref 26.0–34.0)
MCHC: 32.3 g/dL (ref 30.0–36.0)
MCV: 94.9 fL (ref 80.0–100.0)
NRBC: 0 % (ref 0.0–0.2)
Platelets: 257 10*3/uL (ref 150–400)
RBC: 5.29 MIL/uL (ref 4.22–5.81)
RDW: 12 % (ref 11.5–15.5)
WBC: 8.9 10*3/uL (ref 4.0–10.5)

## 2018-01-09 LAB — URINALYSIS, ROUTINE W REFLEX MICROSCOPIC
BACTERIA UA: NONE SEEN
Bilirubin Urine: NEGATIVE
Glucose, UA: NEGATIVE mg/dL
Ketones, ur: NEGATIVE mg/dL
Leukocytes, UA: NEGATIVE
Nitrite: NEGATIVE
PROTEIN: 100 mg/dL — AB
Specific Gravity, Urine: 1.009 (ref 1.005–1.030)
pH: 6 (ref 5.0–8.0)

## 2018-01-09 NOTE — Pre-Procedure Instructions (Signed)
MESSAGED DR CLAPACS OF PATIENTS BP 175/105 AND UNSURE OF ARRIVAL TIME. NOT TAKING BP MED. DR CLAPAC RESPONDED THAT MESSAGE NOTED BY HIM

## 2018-01-10 ENCOUNTER — Encounter: Payer: Self-pay | Admitting: Registered Nurse

## 2018-01-10 ENCOUNTER — Encounter
Admission: RE | Admit: 2018-01-10 | Discharge: 2018-01-10 | Disposition: A | Discharge status: Home / Self Care | Payer: BLUE CROSS/BLUE SHIELD | Source: Ambulatory Visit | Attending: Psychiatry | Admitting: Psychiatry

## 2018-01-10 DIAGNOSIS — G4730 Sleep apnea, unspecified: Secondary | ICD-10-CM | POA: Insufficient documentation

## 2018-01-10 DIAGNOSIS — F339 Major depressive disorder, recurrent, unspecified: Secondary | ICD-10-CM | POA: Insufficient documentation

## 2018-01-10 DIAGNOSIS — F332 Major depressive disorder, recurrent severe without psychotic features: Secondary | ICD-10-CM

## 2018-01-10 DIAGNOSIS — F418 Other specified anxiety disorders: Secondary | ICD-10-CM | POA: Diagnosis not present

## 2018-01-10 DIAGNOSIS — F329 Major depressive disorder, single episode, unspecified: Secondary | ICD-10-CM | POA: Diagnosis present

## 2018-01-10 DIAGNOSIS — F419 Anxiety disorder, unspecified: Secondary | ICD-10-CM | POA: Diagnosis not present

## 2018-01-10 DIAGNOSIS — N189 Chronic kidney disease, unspecified: Secondary | ICD-10-CM | POA: Diagnosis not present

## 2018-01-10 DIAGNOSIS — I129 Hypertensive chronic kidney disease with stage 1 through stage 4 chronic kidney disease, or unspecified chronic kidney disease: Secondary | ICD-10-CM | POA: Diagnosis not present

## 2018-01-10 DIAGNOSIS — Z91040 Latex allergy status: Secondary | ICD-10-CM | POA: Insufficient documentation

## 2018-01-10 DIAGNOSIS — R45851 Suicidal ideations: Secondary | ICD-10-CM | POA: Insufficient documentation

## 2018-01-10 MED ORDER — LABETALOL HCL 5 MG/ML IV SOLN
INTRAVENOUS | Status: DC | PRN
  Administered 2018-01-10: 10 mg via INTRAVENOUS
  Administered 2018-01-10: 5 mg via INTRAVENOUS

## 2018-01-10 MED ORDER — METHOHEXITAL SODIUM 100 MG/10ML IV SOSY
PREFILLED_SYRINGE | INTRAVENOUS | Status: DC | PRN
  Administered 2018-01-10: 120 mg via INTRAVENOUS

## 2018-01-10 MED ORDER — SODIUM CHLORIDE 0.9 % IV SOLN
500.0000 mL | Freq: Once | INTRAVENOUS | Status: AC
  Administered 2018-01-10: 10:00:00 via INTRAVENOUS

## 2018-01-10 MED ORDER — MIDAZOLAM HCL 2 MG/2ML IJ SOLN: 2.0000 mg | Freq: Once | INTRAMUSCULAR | Status: DC

## 2018-01-10 MED ORDER — GLYCOPYRROLATE 0.2 MG/ML IJ SOLN
INTRAMUSCULAR | Status: DC | PRN
  Administered 2018-01-10: 0.1 mg via INTRAVENOUS

## 2018-01-10 MED ORDER — LISINOPRIL 20 MG PO TABS: 20.0000 mg | ORAL_TABLET | Freq: Every day | ORAL | 1 refills | Status: DC

## 2018-01-10 MED ORDER — SUCCINYLCHOLINE CHLORIDE 20 MG/ML IJ SOLN
INTRAMUSCULAR | Status: DC | PRN
  Administered 2018-01-10: 160 mg via INTRAVENOUS

## 2018-01-10 NOTE — H&P (Signed)
Kelly Johnston is an 47 y.o. male.   Chief Complaint: She with major depression with passive suicidal thoughts no intent or plan and no psychosis HPI: History of recurrent depression now beginning index course of ECT  Past Medical History:  Diagnosis Date  . Anxiety   . Chronic kidney disease    protein builds up  . Depression   . Heart murmur   . Hypertension   . MRSA (methicillin resistant staph aureus) culture positive   . Sleep apnea    cpap    Past Surgical History:  Procedure Laterality Date  . chest surgery    . INCISION AND DRAINAGE ABSCESS  01/16/2012   Procedure: INCISION AND DRAINAGE ABSCESS;  Surgeon: Imogene Burn. Georgette Dover, MD;  Location: [facility];  Service: General;  Laterality: Left;  . PECTUS EXCAVATUM REPAIR      Family History  Problem Relation Age of Onset  . Testicular cancer Father   . Alcohol abuse Cousin    Social History:  reports that he has never smoked. He has never used smokeless tobacco. He reports that he has current or past drug history. Drug: Marijuana. He reports that he does not drink alcohol.  Allergies:  Allergies  Allergen Reactions  . Latex Rash     (Not in a hospital admission)  Results for orders placed or performed during the hospital encounter of 01/09/18 (from the past 48 hour(s))  CBC     Status: None   Collection Time: 01/09/18  3:13 PM  Result Value Ref Range   WBC 8.9 4.0 - 10.5 K/uL   RBC 5.29 4.22 - 5.81 MIL/uL   Hemoglobin 16.2 13.0 - 17.0 g/dL   HCT 50.2 39.0 - 52.0 %   MCV 94.9 80.0 - 100.0 fL   MCH 30.6 26.0 - 34.0 pg   MCHC 32.3 30.0 - 36.0 g/dL   RDW 12.0 11.5 - 15.5 %   Platelets 257 150 - 400 K/uL   nRBC 0.0 0.0 - 0.2 %    Comment: Performed at [facility], [address], [place], [place] [address]  Basic metabolic panel     Status: Abnormal   Collection Time: 01/09/18  3:13 PM  Result Value Ref Range   Sodium 142 135 - 145 mmol/L   Potassium 3.9 3.5 - 5.1 mmol/L   Chloride 105 98 - 111 mmol/L   CO2 30 22 - 32 mmol/L   Glucose, Bld 94 70 - 99 mg/dL   BUN 11 6 - 20 mg/dL   Creatinine, Ser 1.25 (H) 0.61 - 1.24 mg/dL   Calcium 8.9 8.9 - 10.3 mg/dL   GFR calc non Af Amer >60 >60 mL/min   GFR calc Af Amer >60 >60 mL/min    Comment: (NOTE) The eGFR has been calculated using the CKD EPI equation. This calculation has not been validated in all clinical situations. eGFR's persistently <60 mL/min signify possible Chronic Kidney Disease.    Anion gap 7 5 - 15    Comment: Performed at [facility], [address] [place],  [address]  Urinalysis, Routine w reflex microscopic     Status: Abnormal   Collection Time: 01/09/18  3:13 PM  Result Value Ref Range   Color, Urine YELLOW (A) YELLOW   APPearance CLEAR (A) CLEAR   Specific Gravity, Urine 1.009 1.005 - 1.030   pH 6.0 5.0 - 8.0   Glucose, UA NEGATIVE NEGATIVE mg/dL   Hgb urine dipstick SMALL (A) NEGATIVE   Bilirubin  Urine NEGATIVE NEGATIVE   Ketones, ur NEGATIVE NEGATIVE mg/dL   Protein, ur 100 (A) NEGATIVE mg/dL   Nitrite NEGATIVE NEGATIVE   Leukocytes, UA NEGATIVE NEGATIVE   RBC / HPF 0-5 0 - 5 RBC/hpf   WBC, UA 0-5 0 - 5 WBC/hpf   Bacteria, UA NONE SEEN NONE SEEN   Squamous Epithelial / LPF 0-5 0 - 5   Mucus PRESENT     Comment: Performed at [facility], [address] [place], [place] [address]   Dg Chest 2 View  Result Date: 01/10/2018 CLINICAL DATA:  Preoperative testing and, hypertension EXAM: CHEST - 2 VIEW COMPARISON:  None available FINDINGS: Deformity of the anterior ribs and chest, most pronounced on lateral view compatible with pectus deformity. This results in prominence of the cardiomediastinal contours. No definite superimposed pneumonia, collapse or consolidation. Negative for edema, large effusion or pneumothorax. Trachea is midline. 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 No other superimposed acute  process. Electronically Signed   By: Jerilynn Mages.  Shick M.D.   On: 01/10/2018 09:02    Review of Systems  Constitutional: Negative.   HENT: Negative.   Eyes: Negative.   Respiratory: Negative.   Cardiovascular: Negative.   Gastrointestinal: Negative.   Musculoskeletal: Negative.   Skin: Negative.   Neurological: Negative.   Psychiatric/Behavioral: Positive for depression. Negative for suicidal ideas.    Blood pressure (!) 155/102, pulse 89, temperature 97.8 F (36.6 C), temperature source Oral, resp. rate 18, height _0  (1.956 m), weight (!) 149.7 kg. Physical Exam  Nursing note and vitals reviewed. Constitutional: He appears well-developed and well-nourished.  HENT:  Head: Normocephalic and atraumatic.  Eyes: Pupils are equal, round, and reactive to light. Conjunctivae are normal.  Neck: Normal range of motion.  Cardiovascular: Regular rhythm and normal heart sounds.  Respiratory: Effort normal. No respiratory distress.  GI: Soft.  Musculoskeletal: Normal range of motion.  Neurological: He is alert.  Skin: Skin is warm and dry.  Psychiatric: Judgment normal. His affect is blunt. His speech is delayed. He is slowed. Cognition and memory are normal. He expresses no suicidal ideation.     Assessment/Plan ECT right unilateral continuing 3 times a week treatment going forward  Alethia Berthold, MD 01/10/2018, 10:07 AM

## 2018-01-10 NOTE — Anesthesia Preprocedure Evaluation (Signed)
Anesthesia Evaluation  Patient identified by MRN, date of birth, ID band Patient awake    Reviewed: Allergy & Precautions, H&P , NPO status , Patient's Chart, lab work & pertinent test results  Airway Mallampati: III  TM Distance: >3 FB Neck ROM: full    Dental  (+) Teeth Intact   Pulmonary sleep apnea ,           Cardiovascular hypertension, + Valvular Problems/Murmurs (murmur)      Neuro/Psych negative neurological ROS  negative psych ROS   GI/Hepatic negative GI ROS, Neg liver ROS,   Endo/Other  Hypothyroidism Morbid obesity  Renal/GU Renal disease  negative genitourinary   Musculoskeletal   Abdominal   Peds  Hematology negative hematology ROS (+)   Anesthesia Other Findings Past Medical History: No date: Anxiety No date: Chronic kidney disease     Comment:  protein builds up No date: Depression No date: Heart murmur No date: Hypertension No date: MRSA (methicillin resistant staph aureus) culture positive No date: Sleep apnea     Comment:  cpap  Past Surgical History: No date: chest surgery 01/16/2012: INCISION AND DRAINAGE ABSCESS     Comment:  Procedure: INCISION AND DRAINAGE ABSCESS;  Surgeon:               Wilmon ArmsMatthew K. Corliss Skainssuei, MD;  Location: [facility];  Service:               General;  Laterality: Left; No date: PECTUS EXCAVATUM REPAIR  BMI    Body Mass Index:  39.13 kg/m      Reproductive/Obstetrics negative OB ROS                             Anesthesia Physical Anesthesia Plan  ASA: III  Anesthesia Plan: General   Post-op Pain Management:    Induction:   PONV Risk Score and Plan:   Airway Management Planned: Mask  Additional Equipment:   Intra-op Plan:   Post-operative Plan:   Informed Consent: I have reviewed the patients History and Physical, chart, labs and discussed the procedure including the risks, benefits and alternatives for the proposed  anesthesia with the patient or authorized representative who has indicated his/her understanding and acceptance.   Dental Advisory Given  Plan Discussed with: Anesthesiologist, CRNA and Surgeon  Anesthesia Plan Comments:         Anesthesia Quick Evaluation

## 2018-01-10 NOTE — Procedures (Signed)
ECT SERVICES Physician's Interval Evaluation & Treatment Note  Patient Identification: Kelly Johnston MRN:  [id number] Date of Evaluation:  01/10/2018 TX #: 1  MADRS:   MMSE: 30  P.E. Findings:  Blood pressure elevated physical exam otherwise unremarkable  Psychiatric Interval Note:  Depressed no psychosis no active suicidal intent  Subjective:  Patient is a 47 y.o. male seen for evaluation for Electroconvulsive Therapy. Chronic depression  Treatment Summary:   [x]   Right Unilateral             []  Bilateral   % Energy : 0.3 ms 50%   Impedance: 1060 ohms  Seizure Energy Index: 13,375 V squared  Postictal Suppression Index: 86%  Seizure Concordance Index: 96%  Medications  Pre Shock: Labetalol 10 mg Brevital 120 mg succinylcholine 160 mg  Post Shock: Versed 2 mg  Seizure Duration: 18 seconds by EMG 29 seconds by EEG   Comments: Increase succinylcholine dose to 116  Lungs:  [x]   Clear to auscultation               []  Other:   Heart:    [x]   Regular rhythm             []  irregular rhythm    [x]   Previous H&P reviewed, patient examined and there are NO CHANGES                 []   Previous H&P reviewed, patient examined and there are changes noted.   [name], MD 11/15/201910:19 AM

## 2018-01-10 NOTE — Transfer of Care (Signed)
Immediate Anesthesia Transfer of Care Note  Patient: Kelly Johnston  Procedure(s) Performed: ECT TX  Patient Location: PACU  Anesthesia Type:General  Level of Consciousness: sedated  Airway & Oxygen Therapy: Patient Spontanous Breathing and Patient connected to face mask oxygen  Post-op Assessment: Report given to RN and Post -op Vital signs reviewed and stable  Post vital signs: Reviewed and stable  Last Vitals:  Vitals Value Taken Time  BP 160/93 01/10/2018 10:35 AM  Temp    Pulse 93 01/10/2018 10:35 AM  Resp 30 01/10/2018 10:35 AM  SpO2 96 % 01/10/2018 10:35 AM    Last Pain:  Vitals:   01/10/18 0839  TempSrc: Oral  PainSc: 0-No pain         Complications: No apparent anesthesia complications

## 2018-01-10 NOTE — Anesthesia Procedure Notes (Signed)
Performed by: Karoline CaldwellStarr, Reigan Tolliver, CRNA Pre-anesthesia Checklist: Patient identified, Emergency Drugs available, Suction available and Patient being monitored Patient Re-evaluated:Patient Re-evaluated prior to induction Oxygen Delivery Method: Circle system utilized Preoxygenation: Pre-oxygenation with 100% oxygen Induction Type: IV induction Ventilation: Mask ventilation throughout procedure, Mask ventilation with difficulty, Two handed mask ventilation required and Oral airway inserted - appropriate to patient size Airway Equipment and Method: Bite block Placement Confirmation: positive ETCO2 Dental Injury: Teeth and Oropharynx as per pre-operative assessment

## 2018-01-10 NOTE — Discharge Instructions (Signed)
1)  The drugs that you have been given will stay in your system until tomorrow so for the       next 24 hours you should not:  A. Drive an automobile  B. Make any legal decisions  C. Drink any alcoholic beverages  2)  You may resume your regular meals upon return home.  3)  A responsible adult must take you home.  Someone should stay with you for a few          hours, then be available by phone for the remainder of the treatment day.  4)  You May experience any of the following symptoms:  Headache, Nausea and a dry mouth (due to the medications you were given),  temporary memory loss and some confusion, or sore muscles (a warm bath  should help this).  If you you experience any of these symptoms let us know on                your return visit.  5)  Report any of the following: any acute discomfort, severe headache, or temperature        greater than 100.5 F.   Also report any unusual redness, swelling, drainage, or pain         at your IV site.    You may report Symptoms to:  ECT PROGRAM- [place] [facility]          Phone: [phone], ECT Department           or Dr. Shary Keylapac's office [phone]  6)  Your next ECT Treatment is Monday November 18   We will call 2 days prior to your scheduled appointment for arrival times.  7)  Nothing to eat or drink after midnight the night before your procedure.  8)  Take      With a sip of water the morning of your procedure.  9)  Other Instructions: Call [phone] to cancel the morning of your procedure due         to illness or emergency.  10) We will call within 72 hours to assess how you are feeling.

## 2018-01-10 NOTE — Anesthesia Post-op Follow-up Note (Signed)
Anesthesia QCDR form completed.        

## 2018-01-12 ENCOUNTER — Other Ambulatory Visit: Payer: Self-pay | Admitting: Psychiatry

## 2018-01-13 ENCOUNTER — Ambulatory Visit: Payer: Self-pay | Admitting: Anesthesiology

## 2018-01-13 ENCOUNTER — Encounter: Payer: Self-pay | Admitting: Anesthesiology

## 2018-01-13 ENCOUNTER — Ambulatory Visit
Admission: RE | Admit: 2018-01-13 | Discharge: 2018-01-13 | Disposition: A | Discharge status: Home / Self Care | Payer: BLUE CROSS/BLUE SHIELD | Source: Ambulatory Visit | Attending: Psychiatry | Admitting: Psychiatry

## 2018-01-13 DIAGNOSIS — F332 Major depressive disorder, recurrent severe without psychotic features: Secondary | ICD-10-CM | POA: Diagnosis not present

## 2018-01-13 DIAGNOSIS — G4730 Sleep apnea, unspecified: Secondary | ICD-10-CM | POA: Diagnosis not present

## 2018-01-13 DIAGNOSIS — F329 Major depressive disorder, single episode, unspecified: Secondary | ICD-10-CM | POA: Insufficient documentation

## 2018-01-13 DIAGNOSIS — F419 Anxiety disorder, unspecified: Secondary | ICD-10-CM | POA: Diagnosis not present

## 2018-01-13 DIAGNOSIS — I129 Hypertensive chronic kidney disease with stage 1 through stage 4 chronic kidney disease, or unspecified chronic kidney disease: Secondary | ICD-10-CM | POA: Insufficient documentation

## 2018-01-13 DIAGNOSIS — F418 Other specified anxiety disorders: Secondary | ICD-10-CM | POA: Diagnosis not present

## 2018-01-13 DIAGNOSIS — Z79899 Other long term (current) drug therapy: Secondary | ICD-10-CM | POA: Insufficient documentation

## 2018-01-13 DIAGNOSIS — N189 Chronic kidney disease, unspecified: Secondary | ICD-10-CM | POA: Insufficient documentation

## 2018-01-13 MED ORDER — MIDAZOLAM HCL 2 MG/2ML IJ SOLN: 2.0000 mg | Freq: Once | INTRAMUSCULAR | Status: DC

## 2018-01-13 MED ORDER — SODIUM CHLORIDE 0.9 % IV SOLN
500.0000 mL | Freq: Once | INTRAVENOUS | Status: AC
  Administered 2018-01-13: 500 mL via INTRAVENOUS

## 2018-01-13 MED ORDER — HYDRALAZINE HCL 20 MG/ML IJ SOLN
10.0000 mg | Freq: Once | INTRAMUSCULAR | Status: AC
  Administered 2018-01-13: 10 mg via INTRAVENOUS

## 2018-01-13 MED ORDER — METOPROLOL TARTRATE 5 MG/5ML IV SOLN
5.0000 mg | Freq: Once | INTRAVENOUS | Status: AC
  Administered 2018-01-13: 5 mg via INTRAVENOUS

## 2018-01-13 MED ORDER — SODIUM CHLORIDE 0.9 % IV SOLN
INTRAVENOUS | Status: DC | PRN
  Administered 2018-01-13: 11:00:00 via INTRAVENOUS

## 2018-01-13 MED ORDER — GLYCOPYRROLATE 0.2 MG/ML IJ SOLN
INTRAMUSCULAR | Status: AC
  Administered 2018-01-13: 0.2 mg via INTRAVENOUS
  Filled 2018-01-13: qty 1

## 2018-01-13 MED ORDER — SUCCINYLCHOLINE CHLORIDE 200 MG/10ML IV SOSY
PREFILLED_SYRINGE | INTRAVENOUS | Status: DC | PRN
  Administered 2018-01-13: 200 mg via INTRAVENOUS

## 2018-01-13 MED ORDER — GLYCOPYRROLATE 0.2 MG/ML IJ SOLN
0.2000 mg | Freq: Once | INTRAMUSCULAR | Status: AC
  Administered 2018-01-13: 0.2 mg via INTRAVENOUS

## 2018-01-13 MED ORDER — ONDANSETRON HCL 4 MG/2ML IJ SOLN
4.0000 mg | Freq: Once | INTRAMUSCULAR | Status: AC
  Administered 2018-01-13: 4 mg via INTRAVENOUS

## 2018-01-13 MED ORDER — MIDAZOLAM HCL 2 MG/2ML IJ SOLN
INTRAMUSCULAR | Status: AC
  Filled 2018-01-13: qty 2

## 2018-01-13 MED ORDER — SUCCINYLCHOLINE CHLORIDE 20 MG/ML IJ SOLN
INTRAMUSCULAR | Status: AC
  Filled 2018-01-13: qty 1

## 2018-01-13 MED ORDER — ONDANSETRON HCL 4 MG/2ML IJ SOLN
INTRAMUSCULAR | Status: AC
  Administered 2018-01-13: 4 mg via INTRAVENOUS
  Filled 2018-01-13: qty 2

## 2018-01-13 MED ORDER — LABETALOL HCL 5 MG/ML IV SOLN
INTRAVENOUS | Status: DC | PRN
  Administered 2018-01-13: 5 mg via INTRAVENOUS
  Administered 2018-01-13: 15 mg via INTRAVENOUS

## 2018-01-13 MED ORDER — GLYCOPYRROLATE 0.2 MG/ML IJ SOLN
INTRAMUSCULAR | Status: AC
  Filled 2018-01-13: qty 1

## 2018-01-13 MED ORDER — FENTANYL CITRATE (PF) 100 MCG/2ML IJ SOLN: 25.0000 ug | INTRAMUSCULAR | Status: DC | PRN

## 2018-01-13 MED ORDER — HYDRALAZINE HCL 20 MG/ML IJ SOLN
INTRAMUSCULAR | Status: AC
  Administered 2018-01-13: 10 mg via INTRAVENOUS
  Filled 2018-01-13: qty 1

## 2018-01-13 MED ORDER — METOPROLOL TARTRATE 5 MG/5ML IV SOLN
INTRAVENOUS | Status: AC
  Filled 2018-01-13: qty 5

## 2018-01-13 MED ORDER — LABETALOL HCL 5 MG/ML IV SOLN
INTRAVENOUS | Status: AC
  Filled 2018-01-13: qty 4

## 2018-01-13 MED ORDER — MIDAZOLAM HCL 2 MG/2ML IJ SOLN
INTRAMUSCULAR | Status: DC | PRN
  Administered 2018-01-13: 2 mg via INTRAVENOUS

## 2018-01-13 MED ORDER — METHOHEXITAL SODIUM 100 MG/10ML IV SOSY
PREFILLED_SYRINGE | INTRAVENOUS | Status: DC | PRN
  Administered 2018-01-13: 120 mg via INTRAVENOUS

## 2018-01-13 MED ORDER — ONDANSETRON HCL 4 MG/2ML IJ SOLN: 4.0000 mg | Freq: Once | INTRAMUSCULAR | Status: DC | PRN

## 2018-01-13 NOTE — Anesthesia Postprocedure Evaluation (Signed)
Anesthesia Post Note  Patient: Kelly Johnston  Procedure(s) Performed: ECT TX  Patient location during evaluation: PACU Anesthesia Type: General Level of consciousness: awake and alert Pain management: pain level controlled Vital Signs Assessment: post-procedure vital signs reviewed and stable Respiratory status: spontaneous breathing, nonlabored ventilation and respiratory function stable Cardiovascular status: blood pressure returned to baseline and stable Postop Assessment: no apparent nausea or vomiting Anesthetic complications: no     Last Vitals:  Vitals:   01/10/18 1059 01/10/18 1111  BP: (!) 156/103 (!) 154/103  Pulse: 89 84  Resp: (!) 24 (!) 28  Temp: 36.7 C 36.7 C  SpO2:      Last Pain:  Vitals:   01/10/18 1111  TempSrc: Oral  PainSc: 0-No pain                 Jovita GammaKathryn L Fitzgerald

## 2018-01-13 NOTE — H&P (Signed)
Kelly Johnston is an 47 y.o. male.   Chief Complaint: Patient with ongoing depression not much better passive suicidal ideation no psychosis HPI: History of recurrent severe depression  Past Medical History:  Diagnosis Date  . Anxiety   . Chronic kidney disease    protein builds up  . Depression   . Heart murmur   . Hypertension   . MRSA (methicillin resistant staph aureus) culture positive   . Sleep apnea    cpap    Past Surgical History:  Procedure Laterality Date  . chest surgery    . INCISION AND DRAINAGE ABSCESS  01/16/2012   Procedure: INCISION AND DRAINAGE ABSCESS;  Surgeon: Wilmon ArmsMatthew K. Corliss Skainssuei, MD;  Location: [facility];  Service: General;  Laterality: Left;  . PECTUS EXCAVATUM REPAIR      Family History  Problem Relation Age of Onset  . Testicular cancer Father   . Alcohol abuse Cousin    Social History:  reports that he has never smoked. He has never used smokeless tobacco. He reports that he has current or past drug history. Drug: Marijuana. He reports that he does not drink alcohol.  Allergies:  Allergies  Allergen Reactions  . Latex Rash     (Not in a hospital admission)  No results found for this or any previous visit (from the past 48 hour(s)). No results found.  Review of Systems  Constitutional: Negative.   HENT: Negative.   Eyes: Negative.   Respiratory: Negative.   Cardiovascular: Negative.   Gastrointestinal: Negative.   Musculoskeletal: Negative.   Skin: Negative.   Neurological: Negative.   Psychiatric/Behavioral: Positive for depression and suicidal ideas. Negative for hallucinations, memory loss and substance abuse. The patient has insomnia. The patient is not nervous/anxious.     Blood pressure (!) 154/102, pulse 86, temperature 98.1 F (36.7 C), temperature source Oral, resp. rate 16, SpO2 98 %. Physical Exam  Nursing note and vitals reviewed. Constitutional: He appears well-developed and well-nourished.  HENT:  Head: Normocephalic and  atraumatic.  Eyes: Pupils are equal, round, and reactive to light. Conjunctivae are normal.  Neck: Normal range of motion.  Cardiovascular: Regular rhythm and normal heart sounds.  Respiratory: Effort normal.  GI: Soft.  Musculoskeletal: Normal range of motion.  Neurological: He is alert.  Skin: Skin is warm and dry.  Psychiatric: Judgment normal. His affect is blunt. His speech is delayed. He is slowed. He exhibits a depressed mood. He expresses no homicidal and no suicidal ideation. He exhibits abnormal recent memory.     Assessment/Plan Continue index course treatment 3 times a week going forward  Kelly Johnston , MD 01/13/2018, 10:34 AM

## 2018-01-13 NOTE — Anesthesia Post-op Follow-up Note (Signed)
Anesthesia QCDR form completed.        

## 2018-01-13 NOTE — Procedures (Signed)
ECT SERVICES Physician's Interval Evaluation & Treatment Note  Patient Identification: Kelly Johnston MRN:  [id number] Date of Evaluation:  01/13/2018 TX #: 2  MADRS:   MMSE:   P.E. Findings:  No change to physical exam.  Blood pressure possibly slightly better but still up.  Psychiatric Interval Note:  Does not feel any different with his mood.  No major complaints  Subjective:  Patient is a 47 y.o. male seen for evaluation for Electroconvulsive Therapy. No active suicidal thoughts but still very depressed  Treatment Summary:   [x]   Right Unilateral             []  Bilateral   % Energy : 0.3 ms 75%   Impedance: 2340 ohms  Seizure Energy Index: 18,338 V squared  Postictal Suppression Index: 89%  Seizure Concordance Index: 98%  Medications  Pre Shock: Robinul 0.2 mg labetalol 15 mg Brevital 120 mg succinylcholine 200 mg  Post Shock: Versed 2 mg  Seizure Duration: 26 seconds by EMG 49 seconds by EEG   Comments: Still had too much movement.  Suggest we increase succinylcholine to 250 mg and probably go up on the Brevital to 150 next time  Lungs:  [x]   Clear to auscultation               []  Other:   Heart:    [x]   Regular rhythm             []  irregular rhythm    [x]   Previous H&P reviewed, patient examined and there are NO CHANGES                 []   Previous H&P reviewed, patient examined and there are changes noted.   Kelly RasmussenJohn Stefan Markarian, MD 11/18/201910:35 AM

## 2018-01-13 NOTE — Transfer of Care (Signed)
Immediate Anesthesia Transfer of Care Note  Patient: Kelly Johnston  Procedure(s) Performed: ECT TX  Patient Location: PACU  Anesthesia Type:General  Level of Consciousness: drowsy and patient cooperative  Airway & Oxygen Therapy: Patient Spontanous Breathing and Patient connected to face mask oxygen  Post-op Assessment: Report given to RN and Post -op Vital signs reviewed and stable  Post vital signs: Reviewed and stable  Last Vitals:  Vitals Value Taken Time  BP 189/127 01/13/2018 11:00 AM  Temp 36.6 C 01/13/2018 10:58 AM  Pulse 95 01/13/2018 11:00 AM  Resp    SpO2 95 % 01/13/2018 11:00 AM    Last Pain:  Vitals:   01/13/18 1058  TempSrc:   PainSc: Asleep         Complications: No apparent anesthesia complications

## 2018-01-13 NOTE — Discharge Instructions (Signed)
1)  The drugs that you have been given will stay in your system until tomorrow so for the       next 24 hours you should not:  A. Drive an automobile  B. Make any legal decisions  C. Drink any alcoholic beverages  2)  You may resume your regular meals upon return home.  3)  A responsible adult must take you home.  Someone should stay with you for a few          hours, then be available by phone for the remainder of the treatment day.  4)  You May experience any of the following symptoms:  Headache, Nausea and a dry mouth (due to the medications you were given),  temporary memory loss and some confusion, or sore muscles (a warm bath  should help this).  If you you experience any of these symptoms let us know on                your return visit.  5)  Report any of the following: any acute discomfort, severe headache, or temperature        greater than 100.5 F.   Also report any unusual redness, swelling, drainage, or pain         at your IV site.    You may report Symptoms to:  ECT PROGRAM- [place] [facility]          Phone: [phone], ECT Department           or Dr. Shary Keylapac's office [phone]  6)  Your next ECT Treatment is Day  Wednesday  Date  January 15, 2018  We will call 2 days prior to your scheduled appointment for arrival times.  7)  Nothing to eat or drink after midnight the night before your procedure.  8)  Take.   With a sip of water the morning of your procedure.  9)  Other Instructions: Call [phone] to cancel the morning of your procedure due         to illness or emergency.  10) We will call within 72 hours to assess how you are feeling.

## 2018-01-13 NOTE — Anesthesia Postprocedure Evaluation (Signed)
Anesthesia Post Note  Patient: Kelly Johnston  Procedure(s) Performed: ECT TX  Patient location during evaluation: PACU Anesthesia Type: General Level of consciousness: awake and alert and oriented Pain management: pain level controlled Vital Signs Assessment: post-procedure vital signs reviewed and stable Respiratory status: spontaneous breathing Cardiovascular status: blood pressure returned to baseline Anesthetic complications: no     Last Vitals:  Vitals:   01/13/18 1139 01/13/18 1149  BP: (!) 183/99 (!) 157/97  Pulse: 94 85  Resp: (!) 25 18  Temp:    SpO2: 94%     Last Pain:  Vitals:   01/13/18 1149  TempSrc:   PainSc: 0-No pain                 Aurelie Dicenzo

## 2018-01-13 NOTE — Anesthesia Preprocedure Evaluation (Signed)
Anesthesia Evaluation  Patient identified by MRN, date of birth, ID band Patient awake    Reviewed: Allergy & Precautions, H&P , NPO status , Patient's Chart, lab work & pertinent test results  Airway Mallampati: III  TM Distance: >3 FB Neck ROM: full    Dental  (+) Teeth Intact   Pulmonary sleep apnea ,           Cardiovascular hypertension, + Valvular Problems/Murmurs (murmur)      Neuro/Psych negative neurological ROS  negative psych ROS   GI/Hepatic negative GI ROS, Neg liver ROS,   Endo/Other  Hypothyroidism Morbid obesity  Renal/GU Renal disease  negative genitourinary   Musculoskeletal   Abdominal   Peds  Hematology negative hematology ROS (+)   Anesthesia Other Findings Past Medical History: No date: Anxiety No date: Chronic kidney disease     Comment:  protein builds up No date: Depression No date: Heart murmur No date: Hypertension No date: MRSA (methicillin resistant staph aureus) culture positive No date: Sleep apnea     Comment:  cpap  Past Surgical History: No date: chest surgery 01/16/2012: INCISION AND DRAINAGE ABSCESS     Comment:  Procedure: INCISION AND DRAINAGE ABSCESS;  Surgeon:               Matthew K. Tsuei, MD;  Location: [facility];  Service:               General;  Laterality: Left; No date: PECTUS EXCAVATUM REPAIR  BMI    Body Mass Index:  39.13 kg/m      Reproductive/Obstetrics negative OB ROS                             Anesthesia Physical Anesthesia Plan  ASA: III  Anesthesia Plan: General   Post-op Pain Management:    Induction:   PONV Risk Score and Plan:   Airway Management Planned: Mask  Additional Equipment:   Intra-op Plan:   Post-operative Plan:   Informed Consent: I have reviewed the patients History and Physical, chart, labs and discussed the procedure including the risks, benefits and alternatives for the proposed  anesthesia with the patient or authorized representative who has indicated his/her understanding and acceptance.   Dental Advisory Given  Plan Discussed with: Anesthesiologist, CRNA and Surgeon  Anesthesia Plan Comments:         Anesthesia Quick Evaluation  

## 2018-01-14 ENCOUNTER — Other Ambulatory Visit: Payer: Self-pay | Admitting: Psychiatry

## 2018-01-15 ENCOUNTER — Telehealth (HOSPITAL_COMMUNITY): Payer: Self-pay | Admitting: *Deleted

## 2018-01-15 ENCOUNTER — Other Ambulatory Visit: Payer: Self-pay | Admitting: Psychiatry

## 2018-01-15 ENCOUNTER — Encounter: Payer: Self-pay | Admitting: Certified Registered Nurse Anesthetist

## 2018-01-15 ENCOUNTER — Encounter
Admission: RE | Admit: 2018-01-15 | Discharge: 2018-01-15 | Disposition: A | Discharge status: Home / Self Care | Payer: BLUE CROSS/BLUE SHIELD | Source: Ambulatory Visit | Attending: Psychiatry | Admitting: Psychiatry

## 2018-01-15 DIAGNOSIS — F418 Other specified anxiety disorders: Secondary | ICD-10-CM | POA: Diagnosis not present

## 2018-01-15 DIAGNOSIS — F332 Major depressive disorder, recurrent severe without psychotic features: Secondary | ICD-10-CM

## 2018-01-15 DIAGNOSIS — F329 Major depressive disorder, single episode, unspecified: Secondary | ICD-10-CM | POA: Insufficient documentation

## 2018-01-15 DIAGNOSIS — G4730 Sleep apnea, unspecified: Secondary | ICD-10-CM | POA: Diagnosis not present

## 2018-01-15 DIAGNOSIS — N189 Chronic kidney disease, unspecified: Secondary | ICD-10-CM | POA: Diagnosis not present

## 2018-01-15 DIAGNOSIS — I129 Hypertensive chronic kidney disease with stage 1 through stage 4 chronic kidney disease, or unspecified chronic kidney disease: Secondary | ICD-10-CM | POA: Diagnosis not present

## 2018-01-15 MED ORDER — KETOROLAC TROMETHAMINE 30 MG/ML IJ SOLN
30.0000 mg | Freq: Once | INTRAMUSCULAR | Status: AC
  Administered 2018-01-15: 30 mg via INTRAVENOUS

## 2018-01-15 MED ORDER — MIDAZOLAM HCL 2 MG/2ML IJ SOLN
2.0000 mg | Freq: Once | INTRAMUSCULAR | Status: AC
  Administered 2018-01-15: 2 mg via INTRAVENOUS

## 2018-01-15 MED ORDER — KETOROLAC TROMETHAMINE 30 MG/ML IJ SOLN
INTRAMUSCULAR | Status: AC
  Administered 2018-01-15: 30 mg via INTRAVENOUS
  Filled 2018-01-15: qty 1

## 2018-01-15 MED ORDER — SODIUM CHLORIDE 0.9 % IV SOLN
INTRAVENOUS | Status: DC | PRN
  Administered 2018-01-15: 10:00:00 via INTRAVENOUS

## 2018-01-15 MED ORDER — ONDANSETRON HCL 4 MG/2ML IJ SOLN
4.0000 mg | Freq: Once | INTRAMUSCULAR | Status: AC
  Administered 2018-01-15: 4 mg via INTRAVENOUS

## 2018-01-15 MED ORDER — LABETALOL HCL 5 MG/ML IV SOLN
INTRAVENOUS | Status: DC | PRN
  Administered 2018-01-15: 20 mg via INTRAVENOUS

## 2018-01-15 MED ORDER — ONDANSETRON HCL 4 MG/2ML IJ SOLN
INTRAMUSCULAR | Status: AC
  Administered 2018-01-15: 4 mg via INTRAVENOUS
  Filled 2018-01-15: qty 2

## 2018-01-15 MED ORDER — METHOHEXITAL SODIUM 100 MG/10ML IV SOSY
PREFILLED_SYRINGE | INTRAVENOUS | Status: DC | PRN
  Administered 2018-01-15: 120 mg via INTRAVENOUS

## 2018-01-15 MED ORDER — MIDAZOLAM HCL 2 MG/2ML IJ SOLN
INTRAMUSCULAR | Status: AC
  Filled 2018-01-15: qty 2

## 2018-01-15 MED ORDER — GLYCOPYRROLATE 0.2 MG/ML IJ SOLN
0.2000 mg | Freq: Once | INTRAMUSCULAR | Status: AC
  Administered 2018-01-15: 0.2 mg via INTRAVENOUS

## 2018-01-15 MED ORDER — LISINOPRIL 20 MG PO TABS: 20.0000 mg | ORAL_TABLET | Freq: Every day | ORAL | 1 refills | Status: DC

## 2018-01-15 MED ORDER — GLYCOPYRROLATE 0.2 MG/ML IJ SOLN
INTRAMUSCULAR | Status: AC
  Filled 2018-01-15: qty 1

## 2018-01-15 MED ORDER — SUCCINYLCHOLINE CHLORIDE 20 MG/ML IJ SOLN
INTRAMUSCULAR | Status: DC | PRN
  Administered 2018-01-15: 250 mg via INTRAVENOUS

## 2018-01-15 MED ORDER — SODIUM CHLORIDE 0.9 % IV SOLN
500.0000 mL | Freq: Once | INTRAVENOUS | Status: AC
  Administered 2018-01-15: 500 mL via INTRAVENOUS

## 2018-01-15 NOTE — Anesthesia Preprocedure Evaluation (Signed)
Anesthesia Evaluation  Patient identified by MRN, date of birth, ID band Patient awake    Reviewed: Allergy & Precautions, H&P , NPO status , Patient's Chart, lab work & pertinent test results  Airway Mallampati: III  TM Distance: >3 FB Neck ROM: full    Dental  (+) Poor Dentition, Chipped   Pulmonary sleep apnea ,           Cardiovascular hypertension, + Valvular Problems/Murmurs (murmur)      Neuro/Psych negative neurological ROS  negative psych ROS   GI/Hepatic negative GI ROS, Neg liver ROS,   Endo/Other  Hypothyroidism Morbid obesity  Renal/GU Renal disease  negative genitourinary   Musculoskeletal   Abdominal   Peds  Hematology negative hematology ROS (+)   Anesthesia Other Findings Past Medical History: No date: Anxiety No date: Chronic kidney disease     Comment:  protein builds up No date: Depression No date: Heart murmur No date: Hypertension No date: MRSA (methicillin resistant staph aureus) culture positive No date: Sleep apnea     Comment:  cpap  Past Surgical History: No date: chest surgery 01/16/2012: INCISION AND DRAINAGE ABSCESS     Comment:  Procedure: INCISION AND DRAINAGE ABSCESS;  Surgeon:               Wilmon ArmsMatthew K. Corliss Skainssuei, MD;  Location: [facility];  Service:               General;  Laterality: Left; No date: PECTUS EXCAVATUM REPAIR  BMI    Body Mass Index:  39.13 kg/m      Reproductive/Obstetrics negative OB ROS                             Anesthesia Physical  Anesthesia Plan  ASA: III  Anesthesia Plan: General   Post-op Pain Management:    Induction: Intravenous  PONV Risk Score and Plan:   Airway Management Planned: Mask  Additional Equipment:   Intra-op Plan:   Post-operative Plan:   Informed Consent: I have reviewed the patients History and Physical, chart, labs and discussed the procedure including the risks, benefits and alternatives  for the proposed anesthesia with the patient or authorized representative who has indicated his/her understanding and acceptance.   Dental Advisory Given  Plan Discussed with: Anesthesiologist, CRNA and Surgeon  Anesthesia Plan Comments: (Patient consented for risks of anesthesia including but not limited to:  - adverse reactions to medications - risk of intubation if required - damage to teeth, lips or other oral mucosa - sore throat or hoarseness - Damage to heart, brain, lungs or loss of life  Patient voiced understanding.)        Anesthesia Quick Evaluation

## 2018-01-15 NOTE — Discharge Instructions (Signed)
1)  The drugs that you have been given will stay in your system until tomorrow so for the       next 24 hours you should not:  A. Drive an automobile  B. Make any legal decisions  C. Drink any alcoholic beverages  2)  You may resume your regular meals upon return home.  3)  A responsible adult must take you home.  Someone should stay with you for a few          hours, then be available by phone for the remainder of the treatment day.  4)  You May experience any of the following symptoms:  Headache, Nausea and a dry mouth (due to the medications you were given),  temporary memory loss and some confusion, or sore muscles (a warm bath  should help this).  If you you experience any of these symptoms let us know on                your return visit.  5)  Report any of the following: any acute discomfort, severe headache, or temperature        greater than 100.5 F.   Also report any unusual redness, swelling, drainage, or pain         at your IV site.    You may report Symptoms to:  ECT PROGRAM- [place] [facility]          Phone: [phone], ECT Department           or Dr. Shary Keylapac's office [phone]  6)  Your next ECT Treatment is Friday November 22  We will call 2 days prior to your scheduled appointment for arrival times.  7)  Nothing to eat or drink after midnight the night before your procedure.  8)  Take LISINOPRIL    With a sip of water the morning of your procedure.  9)  Other Instructions: Call [phone] to cancel the morning of your procedure due         to illness or emergency.  10) We will call within 72 hours to assess how you are feeling.

## 2018-01-15 NOTE — Procedures (Signed)
ECT SERVICES Physician's Interval Evaluation & Treatment Note  Patient Identification: Kelly Johnston MRN:  [id number] Date of Evaluation:  01/15/2018 TX #: 3  MADRS:   MMSE:   P.E. Findings:  Continues to run high blood pressures at baseline otherwise physical unremarkable  Psychiatric Interval Note:  Mood still reported as depressed affect flat  Subjective:  Patient is a 47 y.o. male seen for evaluation for Electroconvulsive Therapy. Does not feel any different  Treatment Summary:   [x]   Right Unilateral             []  Bilateral   % Energy : 0.3 ms 75%   Impedance: 1510 ohms  Seizure Energy Index: 17,866 V squared  Postictal Suppression Index: 50%  Seizure Concordance Index: 96%  Medications  Pre Shock: Robinul 0.2 mg labetalol 20 mg Brevital 120 mg succinylcholine 250 mg  Post Shock: Versed 2 mg  Seizure Duration: 22 seconds by EMG 31 seconds by EEG   Comments: Continue Friday if he is not feeling better by the we can consider switching to bilateral treatment  Lungs:  [x]   Clear to auscultation               []  Other:   Heart:    [x]   Regular rhythm             []  irregular rhythm    [x]   Previous H&P reviewed, patient examined and there are NO CHANGES                 []   Previous H&P reviewed, patient examined and there are changes noted.   Mordecai RasmussenJohn Brodi Nery, MD 11/20/201910:12 AM

## 2018-01-15 NOTE — Anesthesia Postprocedure Evaluation (Signed)
Anesthesia Post Note  Patient: [name]  Procedure(s) Performed: ECT TX  Patient location during evaluation: PACU Anesthesia Type: General Level of consciousness: awake and alert Pain management: pain level controlled Vital Signs Assessment: post-procedure vital signs reviewed and stable Respiratory status: spontaneous breathing, nonlabored ventilation, respiratory function stable and patient connected to nasal cannula oxygen Cardiovascular status: blood pressure returned to baseline and stable Postop Assessment: no apparent nausea or vomiting Anesthetic complications: no     Last Vitals:  Vitals:   01/15/18 1053 01/15/18 1100  BP: (!) 171/110 (!) 168/110  Pulse: 88 75  Resp: 17 16  Temp:  36.8 C  SpO2: 93%     Last Pain:  Vitals:   01/15/18 1100  TempSrc: Oral  PainSc: 0-No pain                 Cleda MccreedyJoseph K Jayon Matton

## 2018-01-15 NOTE — H&P (Signed)
Kelly Johnston is an 47 y.o. male.   Chief Complaint: Patient continues to have significant depression but no active suicidal ideation HPI: History of recurrent severe depression  Past Medical History:  Diagnosis Date  . Anxiety   . Chronic kidney disease    protein builds up  . Depression   . Heart murmur   . Hypertension   . MRSA (methicillin resistant staph aureus) culture positive   . Sleep apnea    cpap    Past Surgical History:  Procedure Laterality Date  . chest surgery    . INCISION AND DRAINAGE ABSCESS  01/16/2012   Procedure: INCISION AND DRAINAGE ABSCESS;  Surgeon: Wilmon ArmsMatthew K. Corliss Skainssuei, MD;  Location: [facility];  Service: General;  Laterality: Left;  . PECTUS EXCAVATUM REPAIR      Family History  Problem Relation Age of Onset  . Testicular cancer Father   . Alcohol abuse Cousin    Social History:  reports that he has never smoked. He has never used smokeless tobacco. He reports that he has current or past drug history. Drug: Marijuana. He reports that he does not drink alcohol.  Allergies:  Allergies  Allergen Reactions  . Latex Rash     (Not in a hospital admission)  No results found for this or any previous visit (from the past 48 hour(s)). No results found.  Review of Systems  Constitutional: Negative.   HENT: Negative.   Eyes: Negative.   Respiratory: Negative.   Cardiovascular: Negative.   Gastrointestinal: Negative.   Musculoskeletal: Negative.   Skin: Negative.   Neurological: Negative.   Psychiatric/Behavioral: Positive for depression. Negative for hallucinations, memory loss, substance abuse and suicidal ideas. The patient has insomnia. The patient is not nervous/anxious.     Pulse 85, temperature 98.1 F (36.7 C), temperature source Oral, resp. rate 16, SpO2 98 %. Physical Exam  Nursing note and vitals reviewed. Constitutional: He appears well-developed and well-nourished.  HENT:  Head: Normocephalic and atraumatic.  Eyes: Pupils are  equal, round, and reactive to light. Conjunctivae are normal.  Neck: Normal range of motion.  Cardiovascular: Regular rhythm and normal heart sounds.  Respiratory: Effort normal. No respiratory distress.  GI: Soft.  Musculoskeletal: Normal range of motion.  Neurological: He is alert.  Skin: Skin is warm and dry.  Psychiatric: Judgment normal. His affect is blunt. His speech is delayed. He is slowed. Cognition and memory are normal. He expresses no suicidal ideation.     Assessment/Plan Treatment today and then continue through the week and into next week.  If not seeing any improvement after 4 treatments may consider switching to bilateral  [name], MD 01/15/2018, 10:09 AM

## 2018-01-15 NOTE — Anesthesia Post-op Follow-up Note (Signed)
Anesthesia QCDR form completed.        

## 2018-01-15 NOTE — Telephone Encounter (Signed)
Called [employer] for prior authorization of outpatient ECT. Spoke with Lesa B who approved 12 sessions from 01/10/18-02/25/18 #[id number].

## 2018-01-15 NOTE — Anesthesia Procedure Notes (Signed)
Date/Time: 01/15/2018 10:11 AM Performed by: Ginger CarneMichelet, Shemicka Cohrs, CRNA Pre-anesthesia Checklist: Patient identified, Timeout performed, Emergency Drugs available, Suction available and Patient being monitored Patient Re-evaluated:Patient Re-evaluated prior to induction Oxygen Delivery Method: Circle system utilized Preoxygenation: Pre-oxygenation with 100% oxygen Induction Type: IV induction Ventilation: Mask ventilation without difficulty

## 2018-01-15 NOTE — Transfer of Care (Signed)
Immediate Anesthesia Transfer of Care Note  Patient: Kelly Johnston  Procedure(s) Performed: ECT TX  Patient Location: PACU  Anesthesia Type:General  Level of Consciousness: sedated  Airway & Oxygen Therapy: Patient Spontanous Breathing and Patient connected to face mask oxygen  Post-op Assessment: Report given to RN and Post -op Vital signs reviewed and stable  Post vital signs: Reviewed and stable  Last Vitals:  Vitals Value Taken Time  BP 179/127 01/15/2018 10:34 AM  Temp    Pulse 90 01/15/2018 10:35 AM  Resp 21 01/15/2018 10:35 AM  SpO2 97 % 01/15/2018 10:35 AM  Vitals shown include unvalidated device data.  Last Pain:  Vitals:   01/15/18 0902  TempSrc: Oral  PainSc: 0-No pain         Complications: No apparent anesthesia complications

## 2018-01-16 ENCOUNTER — Other Ambulatory Visit: Payer: Self-pay | Admitting: Psychiatry

## 2018-01-17 ENCOUNTER — Encounter: Payer: Self-pay | Admitting: Psychiatry

## 2018-01-17 ENCOUNTER — Encounter: Payer: Self-pay | Admitting: Anesthesiology

## 2018-01-17 ENCOUNTER — Telehealth: Payer: Self-pay | Admitting: Psychiatry

## 2018-01-17 ENCOUNTER — Inpatient Hospital Stay: Admission: RE | Admit: 2018-01-17 | Payer: BLUE CROSS/BLUE SHIELD | Source: Ambulatory Visit

## 2018-01-17 ENCOUNTER — Telehealth: Payer: Self-pay

## 2018-01-17 NOTE — Telephone Encounter (Signed)
Wife reports that pt started ECT tx last week and has had 3 ECT treatments (last Friday, Monday, and Wednesday). She reports that he was in bed most of the day yesterday. Wife reports that pt was lost for 3 hours in a familiar part of [place] this evening. She reports that he was unable to recall the year. She reports that he was unable to recall his age or the ages of their children. Wife reports that he missed ECT tx today because he forgot apt. Wife reports that he "just seems dazed" and is confused. She denies any gait disturbance, slurred speech, facial droop, HA, or tremor.   Wife reports that [facility] and had severe catatonic s/s at that time and it was therefore difficult to determine if he had significant memory impairment.  Wife reports that pt's BP has been elevated and one systolic reading was over 200. She reports that he has IgA Nephropathy and has not seen his nephrologist recently. Reports that nephrologist was prescribing Lisinopril and that pt had not been taking Lisinopril. She reports that Lisinopril was re-started on Wednesday. She reports that pt has not been taking Lithium per instructions from ECT providers.  Discussed that memory loss can occur with ECT and that recent cognitive issues may be secondary to ECT treatments. Informed wife that information would be relayed to Dr. Jennelle Humanottle. Recommended seeking urgent medical attention if he were to start to present with any neurological s/s in combination with disorientation and memory impairment. Wife verbalized understanding. Encouraged wife to call back with any questions or it pt's condition changes.

## 2018-01-17 NOTE — Anesthesia Preprocedure Evaluation (Deleted)
Anesthesia Evaluation  Patient identified by MRN, date of birth, ID band Patient awake    Reviewed: Allergy & Precautions, H&P , NPO status , Patient's Chart, lab work & pertinent test results  Airway        Dental   Pulmonary sleep apnea ,           Cardiovascular hypertension, + Valvular Problems/Murmurs (murmur)      Neuro/Psych PSYCHIATRIC DISORDERS Anxiety Depression negative neurological ROS     GI/Hepatic negative GI ROS, Neg liver ROS,   Endo/Other  Hypothyroidism   Renal/GU Renal disease  negative genitourinary   Musculoskeletal   Abdominal   Peds  Hematology negative hematology ROS (+)   Anesthesia Other Findings Past Medical History: No date: Anxiety No date: Chronic kidney disease     Comment:  protein builds up No date: Depression No date: Heart murmur No date: Hypertension No date: MRSA (methicillin resistant staph aureus) culture positive No date: Sleep apnea     Comment:  cpap  Past Surgical History: No date: chest surgery 01/16/2012: INCISION AND DRAINAGE ABSCESS     Comment:  Procedure: INCISION AND DRAINAGE ABSCESS;  Surgeon:               Wilmon ArmsMatthew K. Corliss Skainssuei, MD;  Location: [facility];  Service:               General;  Laterality: Left; No date: PECTUS EXCAVATUM REPAIR     Reproductive/Obstetrics negative OB ROS                            Anesthesia Physical Anesthesia Plan  ASA: III  Anesthesia Plan: General   Post-op Pain Management:    Induction:   PONV Risk Score and Plan:   Airway Management Planned: LMA  Additional Equipment:   Intra-op Plan:   Post-operative Plan:   Informed Consent: I have reviewed the patients History and Physical, chart, labs and discussed the procedure including the risks, benefits and alternatives for the proposed anesthesia with the patient or authorized representative who has indicated his/her understanding and  acceptance.   Dental Advisory Given  Plan Discussed with: Anesthesiologist  Anesthesia Plan Comments: (Previous difficult mask with nasal and oral airways and two hand mask.  Plan LMA)        Anesthesia Quick Evaluation

## 2018-01-19 ENCOUNTER — Telehealth: Payer: Self-pay | Admitting: Psychiatry

## 2018-01-19 ENCOUNTER — Other Ambulatory Visit: Payer: Self-pay | Admitting: Psychiatry

## 2018-01-19 DIAGNOSIS — G4733 Obstructive sleep apnea (adult) (pediatric): Secondary | ICD-10-CM | POA: Diagnosis not present

## 2018-01-19 DIAGNOSIS — F1324 Sedative, hypnotic or anxiolytic dependence with sedative, hypnotic or anxiolytic-induced mood disorder: Secondary | ICD-10-CM | POA: Diagnosis not present

## 2018-01-19 DIAGNOSIS — F19239 Other psychoactive substance dependence with withdrawal, unspecified: Secondary | ICD-10-CM | POA: Diagnosis not present

## 2018-01-19 DIAGNOSIS — F05 Delirium due to known physiological condition: Secondary | ICD-10-CM | POA: Diagnosis not present

## 2018-01-19 DIAGNOSIS — F315 Bipolar disorder, current episode depressed, severe, with psychotic features: Secondary | ICD-10-CM | POA: Diagnosis not present

## 2018-01-19 DIAGNOSIS — F5081 Binge eating disorder: Secondary | ICD-10-CM | POA: Diagnosis not present

## 2018-01-19 DIAGNOSIS — I10 Essential (primary) hypertension: Secondary | ICD-10-CM | POA: Diagnosis not present

## 2018-01-19 DIAGNOSIS — R413 Other amnesia: Secondary | ICD-10-CM | POA: Diagnosis not present

## 2018-01-19 DIAGNOSIS — G9349 Other encephalopathy: Secondary | ICD-10-CM | POA: Diagnosis not present

## 2018-01-19 DIAGNOSIS — R4182 Altered mental status, unspecified: Secondary | ICD-10-CM | POA: Diagnosis not present

## 2018-01-19 DIAGNOSIS — R0600 Dyspnea, unspecified: Secondary | ICD-10-CM | POA: Diagnosis not present

## 2018-01-19 DIAGNOSIS — F322 Major depressive disorder, single episode, severe without psychotic features: Secondary | ICD-10-CM | POA: Diagnosis not present

## 2018-01-19 DIAGNOSIS — Z6837 Body mass index (BMI) 37.0-37.9, adult: Secondary | ICD-10-CM | POA: Diagnosis not present

## 2018-01-19 DIAGNOSIS — R000 Tachycardia, unspecified: Secondary | ICD-10-CM | POA: Diagnosis not present

## 2018-01-19 DIAGNOSIS — E039 Hypothyroidism, unspecified: Secondary | ICD-10-CM | POA: Diagnosis not present

## 2018-01-19 DIAGNOSIS — F13230 Sedative, hypnotic or anxiolytic dependence with withdrawal, uncomplicated: Secondary | ICD-10-CM | POA: Diagnosis not present

## 2018-01-19 DIAGNOSIS — F419 Anxiety disorder, unspecified: Secondary | ICD-10-CM | POA: Diagnosis not present

## 2018-01-19 DIAGNOSIS — F329 Major depressive disorder, single episode, unspecified: Secondary | ICD-10-CM | POA: Diagnosis not present

## 2018-01-19 DIAGNOSIS — R410 Disorientation, unspecified: Secondary | ICD-10-CM | POA: Diagnosis not present

## 2018-01-19 DIAGNOSIS — F13931 Sedative, hypnotic or anxiolytic use, unspecified with withdrawal delirium: Secondary | ICD-10-CM | POA: Diagnosis not present

## 2018-01-19 DIAGNOSIS — F3130 Bipolar disorder, current episode depressed, mild or moderate severity, unspecified: Secondary | ICD-10-CM | POA: Diagnosis not present

## 2018-01-19 DIAGNOSIS — G9340 Encephalopathy, unspecified: Secondary | ICD-10-CM | POA: Diagnosis not present

## 2018-01-19 DIAGNOSIS — T424X5A Adverse effect of benzodiazepines, initial encounter: Secondary | ICD-10-CM | POA: Diagnosis not present

## 2018-01-19 DIAGNOSIS — F13231 Sedative, hypnotic or anxiolytic dependence with withdrawal delirium: Secondary | ICD-10-CM | POA: Diagnosis not present

## 2018-01-19 DIAGNOSIS — F13239 Sedative, hypnotic or anxiolytic dependence with withdrawal, unspecified: Secondary | ICD-10-CM | POA: Diagnosis not present

## 2018-01-19 DIAGNOSIS — R61 Generalized hyperhidrosis: Secondary | ICD-10-CM | POA: Diagnosis not present

## 2018-01-19 DIAGNOSIS — F319 Bipolar disorder, unspecified: Secondary | ICD-10-CM | POA: Diagnosis not present

## 2018-01-19 DIAGNOSIS — F22 Delusional disorders: Secondary | ICD-10-CM | POA: Diagnosis not present

## 2018-01-19 DIAGNOSIS — R45851 Suicidal ideations: Secondary | ICD-10-CM | POA: Diagnosis not present

## 2018-01-19 DIAGNOSIS — R443 Hallucinations, unspecified: Secondary | ICD-10-CM | POA: Diagnosis not present

## 2018-01-19 NOTE — Telephone Encounter (Signed)
Received call from Hurshel KeysJennifer Flamm that pt is currently having hallucinations and paranoia and is making suicidal statements. She reports that he is saying that objects in the home are moving and believes that she is causing this to happen and is against him. She reports that he believes she has altered the TV to where he "can see the beginning of the internet." She reports that he stated this morning, "I'm going to try to find a way to kill myself." She reports that he has never spoken this way in the past and that this is a significant change in his baseline.   Also received call from Hurshel KeysJennifer Larue on 01/17/18 due to pt being disoriented and having memory difficulties, to include not being able to recall his age and the ages of their children, and being lost in the city where he lives for 3 hours. (Pt recently re-started ECT treatment with last ECT tx on 01/15/18. She reports pt has IgA Nephropathy and has not been following up with nephrologist who has been prescribing Lisinoopril and re-started Lisinopril on 01/15/18. She reports that pt also abruptly stopped Lithium as directed by ECT provider prior to starting ECT treatments. Recent elevated BP's to include 179/127 on 01/15/18 and 189/131on 01/13/18.   Recommended that pt be evaluated in the ER due to acute changes in mental status, new onset of psychotic s/s, and verbalizing suicidal intent. Recommended talking with pt to see if he would be willing to go to ER voluntarily to rule out acute medical issues and then calling 911 if pt refuses. Hurshel KeysJennifer Deziel reports that she would [facility]. Spoke with Hurshel KeysJennifer Mcminn again and she reports that pt is agreeable to going to ER and that [facility]. [facility] to provide background clinical information and provided office call back number [phone]) if more information is needed.

## 2018-01-20 ENCOUNTER — Ambulatory Visit: Payer: BLUE CROSS/BLUE SHIELD | Admitting: Psychiatry

## 2018-01-20 ENCOUNTER — Inpatient Hospital Stay: Admission: RE | Admit: 2018-01-20 | Payer: BLUE CROSS/BLUE SHIELD | Source: Ambulatory Visit

## 2018-01-20 DIAGNOSIS — F329 Major depressive disorder, single episode, unspecified: Secondary | ICD-10-CM | POA: Diagnosis not present

## 2018-01-20 DIAGNOSIS — I10 Essential (primary) hypertension: Secondary | ICD-10-CM | POA: Diagnosis not present

## 2018-01-20 DIAGNOSIS — R443 Hallucinations, unspecified: Secondary | ICD-10-CM | POA: Diagnosis not present

## 2018-01-20 DIAGNOSIS — G9349 Other encephalopathy: Secondary | ICD-10-CM | POA: Diagnosis not present

## 2018-01-20 DIAGNOSIS — R45851 Suicidal ideations: Secondary | ICD-10-CM | POA: Diagnosis not present

## 2018-01-20 DIAGNOSIS — R410 Disorientation, unspecified: Secondary | ICD-10-CM | POA: Diagnosis not present

## 2018-01-20 DIAGNOSIS — R000 Tachycardia, unspecified: Secondary | ICD-10-CM | POA: Diagnosis not present

## 2018-01-20 DIAGNOSIS — F315 Bipolar disorder, current episode depressed, severe, with psychotic features: Secondary | ICD-10-CM | POA: Diagnosis not present

## 2018-01-21 DIAGNOSIS — F329 Major depressive disorder, single episode, unspecified: Secondary | ICD-10-CM | POA: Diagnosis not present

## 2018-01-21 DIAGNOSIS — F13931 Sedative, hypnotic or anxiolytic use, unspecified with withdrawal delirium: Secondary | ICD-10-CM | POA: Diagnosis not present

## 2018-01-21 DIAGNOSIS — I10 Essential (primary) hypertension: Secondary | ICD-10-CM | POA: Diagnosis not present

## 2018-01-21 DIAGNOSIS — G9349 Other encephalopathy: Secondary | ICD-10-CM | POA: Diagnosis not present

## 2018-01-21 DIAGNOSIS — R45851 Suicidal ideations: Secondary | ICD-10-CM | POA: Diagnosis not present

## 2018-01-21 DIAGNOSIS — F3130 Bipolar disorder, current episode depressed, mild or moderate severity, unspecified: Secondary | ICD-10-CM | POA: Diagnosis not present

## 2018-01-22 DIAGNOSIS — R61 Generalized hyperhidrosis: Secondary | ICD-10-CM | POA: Diagnosis not present

## 2018-01-22 DIAGNOSIS — R4182 Altered mental status, unspecified: Secondary | ICD-10-CM | POA: Diagnosis not present

## 2018-01-22 DIAGNOSIS — F315 Bipolar disorder, current episode depressed, severe, with psychotic features: Secondary | ICD-10-CM | POA: Diagnosis not present

## 2018-01-22 DIAGNOSIS — R000 Tachycardia, unspecified: Secondary | ICD-10-CM | POA: Diagnosis not present

## 2018-01-22 DIAGNOSIS — R45851 Suicidal ideations: Secondary | ICD-10-CM | POA: Diagnosis not present

## 2018-01-22 DIAGNOSIS — F05 Delirium due to known physiological condition: Secondary | ICD-10-CM | POA: Diagnosis not present

## 2018-01-22 DIAGNOSIS — G9349 Other encephalopathy: Secondary | ICD-10-CM | POA: Diagnosis not present

## 2018-01-22 DIAGNOSIS — F13239 Sedative, hypnotic or anxiolytic dependence with withdrawal, unspecified: Secondary | ICD-10-CM | POA: Diagnosis not present

## 2018-01-22 DIAGNOSIS — I10 Essential (primary) hypertension: Secondary | ICD-10-CM | POA: Diagnosis not present

## 2018-01-23 DIAGNOSIS — F3130 Bipolar disorder, current episode depressed, mild or moderate severity, unspecified: Secondary | ICD-10-CM | POA: Diagnosis not present

## 2018-01-23 DIAGNOSIS — Z6837 Body mass index (BMI) 37.0-37.9, adult: Secondary | ICD-10-CM | POA: Diagnosis not present

## 2018-01-23 DIAGNOSIS — F05 Delirium due to known physiological condition: Secondary | ICD-10-CM | POA: Diagnosis not present

## 2018-01-23 DIAGNOSIS — F329 Major depressive disorder, single episode, unspecified: Secondary | ICD-10-CM | POA: Diagnosis not present

## 2018-01-23 DIAGNOSIS — F1324 Sedative, hypnotic or anxiolytic dependence with sedative, hypnotic or anxiolytic-induced mood disorder: Secondary | ICD-10-CM | POA: Diagnosis not present

## 2018-01-23 DIAGNOSIS — E039 Hypothyroidism, unspecified: Secondary | ICD-10-CM | POA: Diagnosis not present

## 2018-01-24 DIAGNOSIS — I10 Essential (primary) hypertension: Secondary | ICD-10-CM | POA: Diagnosis not present

## 2018-01-24 DIAGNOSIS — F05 Delirium due to known physiological condition: Secondary | ICD-10-CM | POA: Diagnosis not present

## 2018-01-24 DIAGNOSIS — F329 Major depressive disorder, single episode, unspecified: Secondary | ICD-10-CM | POA: Diagnosis not present

## 2018-01-24 DIAGNOSIS — F3130 Bipolar disorder, current episode depressed, mild or moderate severity, unspecified: Secondary | ICD-10-CM | POA: Diagnosis not present

## 2018-01-24 DIAGNOSIS — Z6837 Body mass index (BMI) 37.0-37.9, adult: Secondary | ICD-10-CM | POA: Diagnosis not present

## 2018-01-25 DIAGNOSIS — F329 Major depressive disorder, single episode, unspecified: Secondary | ICD-10-CM | POA: Diagnosis not present

## 2018-01-25 DIAGNOSIS — Z6837 Body mass index (BMI) 37.0-37.9, adult: Secondary | ICD-10-CM | POA: Diagnosis not present

## 2018-01-26 DIAGNOSIS — R61 Generalized hyperhidrosis: Secondary | ICD-10-CM | POA: Diagnosis not present

## 2018-01-26 DIAGNOSIS — R4182 Altered mental status, unspecified: Secondary | ICD-10-CM | POA: Diagnosis not present

## 2018-01-26 DIAGNOSIS — F05 Delirium due to known physiological condition: Secondary | ICD-10-CM | POA: Diagnosis not present

## 2018-01-26 DIAGNOSIS — R000 Tachycardia, unspecified: Secondary | ICD-10-CM | POA: Diagnosis not present

## 2018-01-26 DIAGNOSIS — F3130 Bipolar disorder, current episode depressed, mild or moderate severity, unspecified: Secondary | ICD-10-CM | POA: Diagnosis not present

## 2018-01-26 DIAGNOSIS — F419 Anxiety disorder, unspecified: Secondary | ICD-10-CM | POA: Diagnosis not present

## 2018-01-26 DIAGNOSIS — I10 Essential (primary) hypertension: Secondary | ICD-10-CM | POA: Diagnosis not present

## 2018-01-27 DIAGNOSIS — R61 Generalized hyperhidrosis: Secondary | ICD-10-CM | POA: Diagnosis not present

## 2018-01-27 DIAGNOSIS — R4182 Altered mental status, unspecified: Secondary | ICD-10-CM | POA: Diagnosis not present

## 2018-01-27 DIAGNOSIS — F315 Bipolar disorder, current episode depressed, severe, with psychotic features: Secondary | ICD-10-CM | POA: Diagnosis not present

## 2018-01-27 DIAGNOSIS — I10 Essential (primary) hypertension: Secondary | ICD-10-CM | POA: Diagnosis not present

## 2018-01-27 DIAGNOSIS — R000 Tachycardia, unspecified: Secondary | ICD-10-CM | POA: Diagnosis not present

## 2018-01-27 DIAGNOSIS — F05 Delirium due to known physiological condition: Secondary | ICD-10-CM | POA: Diagnosis not present

## 2018-01-28 ENCOUNTER — Other Ambulatory Visit: Payer: Self-pay | Admitting: Psychiatry

## 2018-01-28 ENCOUNTER — Ambulatory Visit: Payer: BLUE CROSS/BLUE SHIELD | Admitting: Psychiatry

## 2018-01-28 DIAGNOSIS — F13230 Sedative, hypnotic or anxiolytic dependence with withdrawal, uncomplicated: Secondary | ICD-10-CM | POA: Diagnosis not present

## 2018-01-28 DIAGNOSIS — F315 Bipolar disorder, current episode depressed, severe, with psychotic features: Secondary | ICD-10-CM | POA: Diagnosis not present

## 2018-01-28 DIAGNOSIS — F322 Major depressive disorder, single episode, severe without psychotic features: Secondary | ICD-10-CM | POA: Diagnosis not present

## 2018-01-28 DIAGNOSIS — I10 Essential (primary) hypertension: Secondary | ICD-10-CM | POA: Diagnosis not present

## 2018-01-28 DIAGNOSIS — G4733 Obstructive sleep apnea (adult) (pediatric): Secondary | ICD-10-CM | POA: Diagnosis not present

## 2018-01-28 DIAGNOSIS — F19239 Other psychoactive substance dependence with withdrawal, unspecified: Secondary | ICD-10-CM | POA: Diagnosis not present

## 2018-01-28 DIAGNOSIS — G9340 Encephalopathy, unspecified: Secondary | ICD-10-CM | POA: Diagnosis not present

## 2018-01-29 DIAGNOSIS — G4733 Obstructive sleep apnea (adult) (pediatric): Secondary | ICD-10-CM | POA: Diagnosis not present

## 2018-01-29 DIAGNOSIS — F13939 Sedative, hypnotic or anxiolytic use, unspecified with withdrawal, unspecified: Secondary | ICD-10-CM | POA: Diagnosis not present

## 2018-01-29 DIAGNOSIS — F333 Major depressive disorder, recurrent, severe with psychotic symptoms: Secondary | ICD-10-CM | POA: Diagnosis not present

## 2018-01-29 NOTE — Telephone Encounter (Signed)
Discuss with provider  

## 2018-01-30 DIAGNOSIS — F339 Major depressive disorder, recurrent, unspecified: Secondary | ICD-10-CM | POA: Diagnosis not present

## 2018-01-30 DIAGNOSIS — I4510 Unspecified right bundle-branch block: Secondary | ICD-10-CM | POA: Diagnosis not present

## 2018-01-30 DIAGNOSIS — R9431 Abnormal electrocardiogram [ECG] [EKG]: Secondary | ICD-10-CM | POA: Diagnosis not present

## 2018-01-30 DIAGNOSIS — R000 Tachycardia, unspecified: Secondary | ICD-10-CM | POA: Diagnosis not present

## 2018-01-30 DIAGNOSIS — E669 Obesity, unspecified: Secondary | ICD-10-CM | POA: Diagnosis not present

## 2018-01-30 DIAGNOSIS — I10 Essential (primary) hypertension: Secondary | ICD-10-CM | POA: Diagnosis not present

## 2018-01-30 DIAGNOSIS — F332 Major depressive disorder, recurrent severe without psychotic features: Secondary | ICD-10-CM | POA: Diagnosis not present

## 2018-01-31 ENCOUNTER — Encounter: Payer: Self-pay | Admitting: Psychiatry

## 2018-01-31 ENCOUNTER — Telehealth: Payer: Self-pay

## 2018-01-31 ENCOUNTER — Ambulatory Visit: Payer: BLUE CROSS/BLUE SHIELD | Admitting: Psychiatry

## 2018-01-31 DIAGNOSIS — F411 Generalized anxiety disorder: Secondary | ICD-10-CM | POA: Diagnosis not present

## 2018-01-31 DIAGNOSIS — F333 Major depressive disorder, recurrent, severe with psychotic symptoms: Secondary | ICD-10-CM | POA: Diagnosis not present

## 2018-01-31 MED ORDER — CLONIDINE HCL 0.1 MG PO TABS
0.10 | ORAL_TABLET | ORAL | Status: DC
Start: ? — End: 2018-01-31

## 2018-01-31 MED ORDER — LISINOPRIL 40 MG PO TABS
40.00 | ORAL_TABLET | ORAL | Status: DC
Start: 2018-01-31 — End: 2018-01-31

## 2018-01-31 MED ORDER — VENLAFAXINE HCL ER 37.5 MG PO CP24
37.50 | ORAL_CAPSULE | ORAL | Status: DC
Start: 2018-01-31 — End: 2018-01-31

## 2018-01-31 MED ORDER — ACETAMINOPHEN 325 MG PO TABS
650.00 | ORAL_TABLET | ORAL | Status: DC
Start: ? — End: 2018-01-31

## 2018-01-31 MED ORDER — HYDRALAZINE HCL 50 MG PO TABS: 50.0000 mg | ORAL_TABLET | Freq: Three times a day (TID) | ORAL | 1 refills | Status: DC

## 2018-01-31 MED ORDER — HYDRALAZINE HCL 50 MG PO TABS
50.00 | ORAL_TABLET | ORAL | Status: DC
Start: 2018-01-30 — End: 2018-01-31

## 2018-01-31 MED ORDER — MIRTAZAPINE 15 MG PO TABS
7.50 | ORAL_TABLET | ORAL | Status: DC
Start: 2018-01-30 — End: 2018-01-31

## 2018-01-31 MED ORDER — CLORAZEPATE DIPOTASSIUM 7.5 MG PO TABS
7.50 | ORAL_TABLET | ORAL | Status: DC
Start: 2018-01-31 — End: 2018-01-31

## 2018-01-31 NOTE — Patient Instructions (Addendum)
Do not take duloxetine nor Vraylar nor lithium, nor alprazolam which is Xanax.  Increase venlafaxine XR to 2 of the 37.5 mg capsules daily for 5 days then 3 capsules daily.  Restart liothyronine 2 of the 25 mcg tablets each morning  Start one half mirtazapine 45 mg tablet each night in place of the 7.5 mg tablet  Continue vitamin D 50,000 units 1 twice a week.  Continue hydralazine 50 mg 1 tablet 3 times daily  DO NOT TAKE CLORAZEPATE ON THE MORNING OF ECT

## 2018-01-31 NOTE — Progress Notes (Signed)
Kelly Johnston [id number] 1970/07/17 47 y.o.  Subjective:   Patient ID:  Kelly Johnston is a 47 y.o. (DOB 12/12/1970) male.  Chief Complaint:  Chief Complaint  Patient presents with  . Follow-up    Review medications    HPI Kelly Johnston presents to the office today for follow-up of recent psych hosp at [facility].  Admitted with hallucinations, delusions and memory problems and had accidentally stopped psych meds.  He had a mixture of symptoms that was diagnosed is perhaps delirium related to benzodiazepine withdrawal as well as depression with psychotic features having just had an ECT treatment.  He was hospitalized at [facility] from December 3 until December 5.  He is set to resume ECT at [facility] on an outpatient basis on Monday for 3 times a week treatment  He was confused about what meds to take he brought his medications with him and the discharge instructions.  Glum but glad to be out of the hospital.  DC yesterday.  Still depressed.  No longer confused.  Staying with a friend.  No SI today. NO AH<VH, no confusion but overwhelmed.  Plans to start ECT Monday as planned: R unilateral.    Review of Systems:  Review of Systems  Neurological: Positive for weakness. Negative for tremors.  Psychiatric/Behavioral: Positive for confusion, decreased concentration, dysphoric mood and sleep disturbance. Negative for agitation, behavioral problems, hallucinations, self-injury and suicidal ideas. The patient is nervous/anxious. The patient is not hyperactive.     Medications: I have reviewed the patient's current medications.  Current Outpatient Medications  Medication Sig Dispense Refill  . cariprazine (VRAYLAR) capsule Take 1 capsule (3 mg total) by mouth daily. (Patient not taking: Reported on 01/31/2018) 30 capsule 2  . liothyronine (CYTOMEL) 25 MCG tablet Take 2 tablets (50 mcg total) by mouth daily. (Patient not taking: Reported on 01/31/2018) 90 tablet 0  . lisinopril  (PRINIVIL,ZESTRIL) 20 MG tablet Take 1 tablet (20 mg total) by mouth daily. 30 tablet 1  . lithium carbonate 300 MG capsule Take 3 capsules (900 mg total) by mouth every evening. (Patient not taking: Reported on 01/31/2018) 90 capsule 0  . mirtazapine (REMERON) 45 MG tablet Take 1 tablet (45 mg total) by mouth at bedtime. 30 tablet 0  . Vitamin D, Ergocalciferol, (DRISDOL) 50000 units CAPS capsule Take 1 capsule (50,000 Units total) by mouth 2 (two) times a week. 8 capsule 2   No current facility-administered medications for this visit.     Medication Side Effects: None  Allergies:  Allergies  Allergen Reactions  . Latex Rash    Past Medical History:  Diagnosis Date  . Anxiety   . Chronic kidney disease    protein builds up  . Depression   . Heart murmur   . Hypertension   . IgA nephropathy   . MRSA (methicillin resistant staph aureus) culture positive   . Sleep apnea    cpap    Family History  Problem Relation Age of Onset  . Testicular cancer Father   . Alcohol abuse Cousin     Social History   Socioeconomic History  . Marital status: Divorced    Spouse name: Not on file  . Number of children: 3  . Years of education: Not on file  . Highest education level: Professional school degree (e.g., MD, DDS, DVM, JD)  Occupational History  . Not on file  Social Needs  . Financial resource strain: Not hard at all  . Food insecurity:  Worry: Never true    Inability: Never true  . Transportation needs:    Medical: No    Non-medical: No  Tobacco Use  . Smoking status: Never Smoker  . Smokeless tobacco: Never Used  Substance and Sexual Activity  . Alcohol use: No  . Drug use: Not Currently    Types: Marijuana    Comment: last used 3 to 4 years ago  . Sexual activity: Yes    Birth control/protection: Other-see comments  Lifestyle  . Physical activity:    Days per week: 0 days    Minutes per session: 0 min  . Stress: Very much  Relationships  . Social  connections:    Talks on phone: Not on file    Gets together: Not on file    Attends religious service: Never    Active member of club or organization: No    Attends meetings of clubs or organizations: Never    Relationship status: Divorced  . Intimate partner violence:    Fear of current or ex partner: No    Emotionally abused: No    Physically abused: No    Forced sexual activity: No  Other Topics Concern  . Not on file  Social History Narrative  . Not on file    Past Medical History, Surgical history, Social history, and Family history were reviewed and updated as appropriate.   Please see review of systems for further details on the patient's review from today.   Objective:   Physical Exam:  There were no vitals taken for this visit.  Physical Exam  Neurological: He displays no tremor. Gait normal.  Psychiatric: His mood appears anxious. His speech is not rapid and/or pressured and not slurred. He is hyperactive. Thought content is not paranoid and not delusional. Cognition and memory are normal. He exhibits a depressed mood. He expresses no homicidal and no suicidal ideation.  Insight and judgment fair to good. No cognitive deficits noted.  Aware of how to take meds. He is attentive.    Lab Review:     Component Value Date/Time   NA 142 01/09/2018 1513   K 3.9 01/09/2018 1513   CL 105 01/09/2018 1513   CO2 30 01/09/2018 1513   GLUCOSE 94 01/09/2018 1513   BUN 11 01/09/2018 1513   CREATININE 1.25 (H) 01/09/2018 1513   CREATININE 1.82 (H) 11/03/2013 1613   CALCIUM 8.9 01/09/2018 1513   PROT 6.9 05/30/2016 0409   ALBUMIN 3.2 (L) 05/30/2016 0409   AST 21 05/30/2016 0409   ALT 15 (L) 05/30/2016 0409   ALKPHOS 87 05/30/2016 0409   BILITOT 0.9 05/30/2016 0409   GFRNONAA >60 01/09/2018 1513   GFRNONAA 44 (L) 11/03/2013 1613   GFRAA >60 01/09/2018 1513   GFRAA 51 (L) 11/03/2013 1613       Component Value Date/Time   WBC 8.9 01/09/2018 1513   RBC 5.29  01/09/2018 1513   HGB 16.2 01/09/2018 1513   HCT 50.2 01/09/2018 1513   PLT 257 01/09/2018 1513   MCV 94.9 01/09/2018 1513   MCV 90.6 11/03/2013 1613   MCH 30.6 01/09/2018 1513   MCHC 32.3 01/09/2018 1513   RDW 12.0 01/09/2018 1513   LYMPHSABS 1.9 05/30/2016 0409   MONOABS 1.3 (H) 05/30/2016 0409   EOSABS 0.4 05/30/2016 0409   BASOSABS 0.0 05/30/2016 0409    No results found for: POCLITH, LITHIUM   No results found for: PHENYTOIN, PHENOBARB, VALPROATE, CBMZ   .res Assessment: Plan:  Severe episode of recurrent major depressive disorder, with psychotic features (HCC)  Generalized anxiety disorder   Just discharged from the hospital yesterday after a stay for delirium and major depression with psychotic features.  The delirium and psychosis have resolved.  He is on a clorazepate 7.5 twice daily 5-day taper.  He was instructed not to take that on the day of the ECT.  It is recommended that he not stay alone until his next psychiatric appointment and we can establish his safety and stability. Restart Cytomel 25 mcg BID  Increase the venlafaxine XR gradually to the previous dose of 150 mg daily.  We will achieve that at his next visit. Other option disc with [facility] is nortriptyline.  DC duloxetine and Vraylar, lithium and Xanax  After ECT restart lithium for prophylaxis.  Please see After Visit Summary for patient specific instructions.  Do not take duloxetine nor Vraylar nor lithium, nor alprazolam which is Xanax.  Increase venlafaxine XR to 2 of the 37.5 mg capsules daily for 5 days then 3 capsules daily.  Restart liothyronine 2 of the 25 mcg tablets each morning  Start one half mirtazapine 45 mg tablet each night in place of the 7.5 mg tablet  Continue vitamin D 50,000 units 1 twice a week.  Continue hydralazine 50 mg 1 tablet 3 times daily  DO NOT TAKE CLORAZEPATE ON THE MORNING OF ECT  Call if there is a return of any confusion or paranoia or stop suicidal thoughts.   Discussed safety plan at length with patient.  Advised patient to contact office with any worsening signs and symptoms.  Instructed patient to go to the [facility] emergency room for evaluation if experiencing any acute safety concerns, to include suicidal intent. He agrees.   40 MIN APPT  FU 3 weeks.  Meredith Staggers, MD, DFAPA   Future Appointments  Date Time Provider Department Center  02/03/2018  9:30 AM [facility] ECT [facility] None    No orders of the defined types were placed in this encounter.     -------------------------------

## 2018-02-02 ENCOUNTER — Other Ambulatory Visit: Payer: Self-pay | Admitting: Psychiatry

## 2018-02-03 ENCOUNTER — Encounter: Payer: Self-pay | Admitting: Anesthesiology

## 2018-02-03 ENCOUNTER — Encounter
Admission: RE | Admit: 2018-02-03 | Discharge: 2018-02-03 | Disposition: A | Discharge status: Home / Self Care | Payer: BLUE CROSS/BLUE SHIELD | Source: Ambulatory Visit | Attending: Psychiatry | Admitting: Psychiatry

## 2018-02-03 DIAGNOSIS — R45851 Suicidal ideations: Secondary | ICD-10-CM | POA: Diagnosis not present

## 2018-02-03 DIAGNOSIS — I129 Hypertensive chronic kidney disease with stage 1 through stage 4 chronic kidney disease, or unspecified chronic kidney disease: Secondary | ICD-10-CM | POA: Insufficient documentation

## 2018-02-03 DIAGNOSIS — N189 Chronic kidney disease, unspecified: Secondary | ICD-10-CM | POA: Diagnosis not present

## 2018-02-03 DIAGNOSIS — F419 Anxiety disorder, unspecified: Secondary | ICD-10-CM | POA: Insufficient documentation

## 2018-02-03 DIAGNOSIS — Z91040 Latex allergy status: Secondary | ICD-10-CM | POA: Diagnosis not present

## 2018-02-03 DIAGNOSIS — F418 Other specified anxiety disorders: Secondary | ICD-10-CM | POA: Diagnosis not present

## 2018-02-03 DIAGNOSIS — F329 Major depressive disorder, single episode, unspecified: Secondary | ICD-10-CM | POA: Diagnosis present

## 2018-02-03 DIAGNOSIS — G4730 Sleep apnea, unspecified: Secondary | ICD-10-CM | POA: Diagnosis not present

## 2018-02-03 DIAGNOSIS — F339 Major depressive disorder, recurrent, unspecified: Secondary | ICD-10-CM | POA: Insufficient documentation

## 2018-02-03 HISTORY — DX: Sedative, hypnotic or anxiolytic dependence with withdrawal, unspecified: F13.239

## 2018-02-03 HISTORY — DX: Sedative, hypnotic or anxiolytic use, unspecified with withdrawal, unspecified: F13.939

## 2018-02-03 MED ORDER — KETOROLAC TROMETHAMINE 30 MG/ML IJ SOLN
INTRAMUSCULAR | Status: AC
  Filled 2018-02-03: qty 1

## 2018-02-03 MED ORDER — GLYCOPYRROLATE 0.2 MG/ML IJ SOLN
INTRAMUSCULAR | Status: AC
  Filled 2018-02-03: qty 1

## 2018-02-03 MED ORDER — SODIUM CHLORIDE 0.9 % IV SOLN
INTRAVENOUS | Status: DC | PRN
  Administered 2018-02-03: 09:00:00 via INTRAVENOUS

## 2018-02-03 MED ORDER — LABETALOL HCL 5 MG/ML IV SOLN
INTRAVENOUS | Status: DC | PRN
  Administered 2018-02-03: 20 mg via INTRAVENOUS

## 2018-02-03 MED ORDER — LABETALOL HCL 5 MG/ML IV SOLN
INTRAVENOUS | Status: AC
  Filled 2018-02-03: qty 4

## 2018-02-03 MED ORDER — METHOHEXITAL SODIUM 100 MG/10ML IV SOSY
PREFILLED_SYRINGE | INTRAVENOUS | Status: DC | PRN
  Administered 2018-02-03: 120 mg via INTRAVENOUS

## 2018-02-03 MED ORDER — SUCCINYLCHOLINE CHLORIDE 200 MG/10ML IV SOSY
PREFILLED_SYRINGE | INTRAVENOUS | Status: DC | PRN
  Administered 2018-02-03: 250 mg via INTRAVENOUS

## 2018-02-03 MED ORDER — MIDAZOLAM HCL 2 MG/2ML IJ SOLN: 2.0000 mg | Freq: Once | INTRAMUSCULAR | Status: DC

## 2018-02-03 MED ORDER — SODIUM CHLORIDE 0.9 % IV SOLN
500.0000 mL | Freq: Once | INTRAVENOUS | Status: AC
  Administered 2018-02-03: 500 mL via INTRAVENOUS

## 2018-02-03 MED ORDER — GLYCOPYRROLATE 0.2 MG/ML IJ SOLN
0.2000 mg | Freq: Once | INTRAMUSCULAR | Status: AC
  Administered 2018-02-03: 0.2 mg via INTRAVENOUS

## 2018-02-03 MED ORDER — SUCCINYLCHOLINE CHLORIDE 20 MG/ML IJ SOLN
INTRAMUSCULAR | Status: AC
  Filled 2018-02-03: qty 1

## 2018-02-03 MED ORDER — MIDAZOLAM HCL 2 MG/2ML IJ SOLN
INTRAMUSCULAR | Status: DC | PRN
  Administered 2018-02-03: 2 mg via INTRAVENOUS

## 2018-02-03 MED ORDER — MIDAZOLAM HCL 2 MG/2ML IJ SOLN
INTRAMUSCULAR | Status: AC
  Filled 2018-02-03: qty 2

## 2018-02-03 MED ORDER — KETOROLAC TROMETHAMINE 30 MG/ML IJ SOLN
30.0000 mg | Freq: Once | INTRAMUSCULAR | Status: AC
  Administered 2018-02-03: 30 mg via INTRAVENOUS

## 2018-02-03 NOTE — Transfer of Care (Signed)
Immediate Anesthesia Transfer of Care Note  Patient: Kelly Johnston  Procedure(s) Performed: ECT TX  Patient Location: PACU  Anesthesia Type:General  Level of Consciousness: sedated  Airway & Oxygen Therapy: Patient Spontanous Breathing and Patient connected to face mask oxygen  Post-op Assessment: Report given to RN and Post -op Vital signs reviewed and stable  Post vital signs: Reviewed and stable  Last Vitals:  Vitals Value Taken Time  BP 157/117 02/03/2018 11:07 AM  Temp 36.8 C 02/03/2018 11:07 AM  Pulse 89 02/03/2018 11:10 AM  Resp 34 02/03/2018 11:10 AM  SpO2 97 % 02/03/2018 11:10 AM  Vitals shown include unvalidated device data.  Last Pain:  Vitals:   02/03/18 1107  TempSrc:   PainSc: Asleep         Complications: No apparent anesthesia complications

## 2018-02-03 NOTE — Anesthesia Postprocedure Evaluation (Signed)
Anesthesia Post Note  Patient: Kelly Johnston  Procedure(s) Performed: ECT TX  Patient location during evaluation: PACU Anesthesia Type: General Level of consciousness: awake and alert Pain management: pain level controlled Vital Signs Assessment: post-procedure vital signs reviewed and stable Respiratory status: spontaneous breathing, nonlabored ventilation and respiratory function stable Cardiovascular status: blood pressure returned to baseline and stable Postop Assessment: no signs of nausea or vomiting Anesthetic complications: no     Last Vitals:  Vitals:   02/03/18 1131 02/03/18 1136  BP: (!) 156/102 136/83  Pulse: 95 87  Resp: (!) 24 18  Temp:    SpO2: 95%     Last Pain:  Vitals:   02/03/18 1136  TempSrc:   PainSc: 0-No pain                 Mcclain Shall

## 2018-02-03 NOTE — Anesthesia Post-op Follow-up Note (Signed)
Anesthesia QCDR form completed.        

## 2018-02-03 NOTE — Anesthesia Preprocedure Evaluation (Signed)
Anesthesia Evaluation  Patient identified by MRN, date of birth, ID band Patient awake    Reviewed: Allergy & Precautions, NPO status , Patient's Chart, lab work & pertinent test results  History of Anesthesia Complications Negative for: history of anesthetic complications  Airway Mallampati: III  TM Distance: >3 FB Neck ROM: Full    Dental  (+) Poor Dentition   Pulmonary sleep apnea and Continuous Positive Airway Pressure Ventilation , neg COPD,    breath sounds clear to auscultation- rhonchi (-) wheezing      Cardiovascular Exercise Tolerance: Good hypertension, Pt. on medications (-) CAD, (-) Past MI, (-) Cardiac Stents and (-) CABG  Rhythm:Regular Rate:Normal - Systolic murmurs and - Diastolic murmurs    Neuro/Psych neg Seizures PSYCHIATRIC DISORDERS Anxiety Depression negative neurological ROS     GI/Hepatic negative GI ROS, Neg liver ROS,   Endo/Other  neg diabetesHypothyroidism   Renal/GU Renal disease     Musculoskeletal negative musculoskeletal ROS (+)   Abdominal (+) + obese,   Peds  Hematology negative hematology ROS (+)   Anesthesia Other Findings Past Medical History: No date: Anxiety No date: Benzodiazepine withdrawal (HCC) No date: Chronic kidney disease     Comment:  protein builds up No date: Depression No date: Heart murmur No date: Hypertension No date: IgA nephropathy No date: Sleep apnea     Comment:  cpap   Reproductive/Obstetrics                             Anesthesia Physical  Anesthesia Plan  ASA: II  Anesthesia Plan: General   Post-op Pain Management:    Induction: Intravenous  PONV Risk Score and Plan: 1 and Midazolam  Airway Management Planned: Mask  Additional Equipment:   Intra-op Plan:   Post-operative Plan:   Informed Consent: I have reviewed the patients History and Physical, chart, labs and discussed the procedure including the  risks, benefits and alternatives for the proposed anesthesia with the patient or authorized representative who has indicated his/her understanding and acceptance.   Dental advisory given  Plan Discussed with: CRNA and Anesthesiologist  Anesthesia Plan Comments:         Anesthesia Quick Evaluation  

## 2018-02-03 NOTE — Anesthesia Procedure Notes (Signed)
Date/Time: 02/03/2018 10:16 AM Performed by: Lily KocherPeralta, Kaian Fahs, CRNA Pre-anesthesia Checklist: Patient identified, Emergency Drugs available, Suction available and Patient being monitored Patient Re-evaluated:Patient Re-evaluated prior to induction Oxygen Delivery Method: Circle system utilized Preoxygenation: Pre-oxygenation with 100% oxygen Induction Type: IV induction Ventilation: Mask ventilation throughout procedure and Oral airway inserted - appropriate to patient size Airway Equipment and Method: Bite block Placement Confirmation: positive ETCO2 Dental Injury: Teeth and Oropharynx as per pre-operative assessment

## 2018-02-03 NOTE — H&P (Signed)
Kelly Johnston is an 47 y.o. male.   Chief Complaint: ongoing anxiety and depression  HPI: history of chronic depression   Past Medical History:  Diagnosis Date  . Anxiety   . Benzodiazepine withdrawal (HCC)   . Chronic kidney disease    protein builds up  . Depression   . Heart murmur   . Hypertension   . IgA nephropathy   . Sleep apnea    cpap    Past Surgical History:  Procedure Laterality Date  . chest surgery    . INCISION AND DRAINAGE ABSCESS  01/16/2012   Procedure: INCISION AND DRAINAGE ABSCESS;  Surgeon: Wilmon ArmsMatthew K. Corliss Skainssuei, MD;  Location: [facility] ORS;  Service: General;  Laterality: Left;  . PECTUS EXCAVATUM REPAIR      Family History  Problem Relation Age of Onset  . Testicular cancer Father   . Alcohol abuse Cousin    Social History:  reports that he has never smoked. He has never used smokeless tobacco. He reports that he has current or past drug history. Drug: Marijuana. He reports that he does not drink alcohol.  Allergies:  Allergies  Allergen Reactions  . Latex Rash     (Not in a hospital admission)  No results found for this or any previous visit (from the past 48 hour(s)). No results found.  Review of Systems  Constitutional: Negative.   HENT: Negative.   Eyes: Negative.   Respiratory: Negative.   Cardiovascular: Negative.   Gastrointestinal: Negative.   Musculoskeletal: Negative.   Skin: Negative.   Neurological: Negative.   Psychiatric/Behavioral: Positive for depression and memory loss. Negative for hallucinations, substance abuse and suicidal ideas. The patient has insomnia. The patient is not nervous/anxious.     Blood pressure (!) 152/102, pulse 86, temperature 97.7 F (36.5 C), temperature source Oral, resp. rate 16, height 6\' 5"  (1.956 m), weight (!) 142.9 kg, SpO2 99 %. Physical Exam   Assessment/Plan Resume index ECT after break for hospital stay  Mordecai RasmussenJohn Jeimy Bickert, MD 02/03/2018, 10:09 AM

## 2018-02-03 NOTE — Discharge Instructions (Signed)
1)  The drugs that you have been given will stay in your system until tomorrow so for the       next 24 hours you should not:  A. Drive an automobile  B. Make any legal decisions  C. Drink any alcoholic beverages  2)  You may resume your regular meals upon return home.  3)  A responsible adult must take you home.  Someone should stay with you for a few          hours, then be available by phone for the remainder of the treatment day.  4)  You May experience any of the following symptoms:  Headache, Nausea and a dry mouth (due to the medications you were given),  temporary memory loss and some confusion, or sore muscles (a warm bath  should help this).  If you you experience any of these symptoms let us know on                your return visit.  5)  Report any of the following: any acute discomfort, severe headache, or temperature        greater than 100.5 F.   Also report any unusual redness, swelling, drainage, or pain         at your IV site.    You may report Symptoms to:  [facility] [place] [facility]          Phone: [phone], ECT Department           or Dr. Shary Keylapac's office [phone]  6)  Your next ECT Treatment is Wednesday December 11 at 8:30   We will call 2 days prior to your scheduled appointment for arrival times.  7)  Nothing to eat or drink after midnight the night before your procedure.  8)  Take      With a sip of water the morning of your procedure.  9)  Other Instructions: Call [phone] to cancel the morning of your procedure due         to illness or emergency.  10) We will call within 72 hours to assess how you are feeling.

## 2018-02-05 ENCOUNTER — Encounter: Payer: Self-pay | Admitting: Anesthesiology

## 2018-02-05 ENCOUNTER — Other Ambulatory Visit: Payer: Self-pay | Admitting: Psychiatry

## 2018-02-05 ENCOUNTER — Encounter (HOSPITAL_BASED_OUTPATIENT_CLINIC_OR_DEPARTMENT_OTHER)
Admission: RE | Admit: 2018-02-05 | Discharge: 2018-02-05 | Disposition: A | Discharge status: Home / Self Care | Payer: BLUE CROSS/BLUE SHIELD | Source: Ambulatory Visit | Attending: Psychiatry | Admitting: Psychiatry

## 2018-02-05 DIAGNOSIS — R45851 Suicidal ideations: Secondary | ICD-10-CM | POA: Diagnosis not present

## 2018-02-05 DIAGNOSIS — G4730 Sleep apnea, unspecified: Secondary | ICD-10-CM | POA: Diagnosis not present

## 2018-02-05 DIAGNOSIS — F419 Anxiety disorder, unspecified: Secondary | ICD-10-CM | POA: Diagnosis not present

## 2018-02-05 DIAGNOSIS — F332 Major depressive disorder, recurrent severe without psychotic features: Secondary | ICD-10-CM

## 2018-02-05 DIAGNOSIS — Z91040 Latex allergy status: Secondary | ICD-10-CM | POA: Diagnosis not present

## 2018-02-05 DIAGNOSIS — F339 Major depressive disorder, recurrent, unspecified: Secondary | ICD-10-CM | POA: Diagnosis not present

## 2018-02-05 DIAGNOSIS — F329 Major depressive disorder, single episode, unspecified: Secondary | ICD-10-CM | POA: Diagnosis not present

## 2018-02-05 DIAGNOSIS — I129 Hypertensive chronic kidney disease with stage 1 through stage 4 chronic kidney disease, or unspecified chronic kidney disease: Secondary | ICD-10-CM | POA: Diagnosis not present

## 2018-02-05 DIAGNOSIS — N189 Chronic kidney disease, unspecified: Secondary | ICD-10-CM | POA: Diagnosis not present

## 2018-02-05 LAB — POCT PREGNANCY, URINE: Preg Test, Ur: NEGATIVE

## 2018-02-05 MED ORDER — ESMOLOL HCL 100 MG/10ML IV SOLN
INTRAVENOUS | Status: DC | PRN
  Administered 2018-02-05: 10 mg via INTRAVENOUS

## 2018-02-05 MED ORDER — LABETALOL HCL 5 MG/ML IV SOLN
INTRAVENOUS | Status: DC | PRN
  Administered 2018-02-05: 20 mg via INTRAVENOUS

## 2018-02-05 MED ORDER — SUCCINYLCHOLINE CHLORIDE 20 MG/ML IJ SOLN
INTRAMUSCULAR | Status: AC
  Filled 2018-02-05: qty 1

## 2018-02-05 MED ORDER — SODIUM CHLORIDE 0.9 % IV SOLN
INTRAVENOUS | Status: DC | PRN
  Administered 2018-02-05: 10:00:00 via INTRAVENOUS

## 2018-02-05 MED ORDER — GLYCOPYRROLATE 0.2 MG/ML IJ SOLN
INTRAMUSCULAR | Status: AC
  Filled 2018-02-05: qty 1

## 2018-02-05 MED ORDER — GLYCOPYRROLATE 0.2 MG/ML IJ SOLN
0.2000 mg | Freq: Once | INTRAMUSCULAR | Status: AC
  Administered 2018-02-05: 0.2 mg via INTRAVENOUS

## 2018-02-05 MED ORDER — SODIUM CHLORIDE 0.9 % IV SOLN
500.0000 mL | Freq: Once | INTRAVENOUS | Status: AC
  Administered 2018-02-05: 500 mL via INTRAVENOUS

## 2018-02-05 MED ORDER — LABETALOL HCL 5 MG/ML IV SOLN
INTRAVENOUS | Status: AC
  Filled 2018-02-05: qty 4

## 2018-02-05 MED ORDER — SUCCINYLCHOLINE CHLORIDE 200 MG/10ML IV SOSY
PREFILLED_SYRINGE | INTRAVENOUS | Status: DC | PRN
  Administered 2018-02-05: 250 mg via INTRAVENOUS

## 2018-02-05 MED ORDER — METHOHEXITAL SODIUM 0.5 G IJ SOLR
INTRAMUSCULAR | Status: AC
  Filled 2018-02-05: qty 500

## 2018-02-05 MED ORDER — MIDAZOLAM HCL 2 MG/2ML IJ SOLN
INTRAMUSCULAR | Status: DC | PRN
  Administered 2018-02-05: 2 mg via INTRAVENOUS

## 2018-02-05 MED ORDER — MIDAZOLAM HCL 2 MG/2ML IJ SOLN: 2.0000 mg | Freq: Once | INTRAMUSCULAR | Status: DC

## 2018-02-05 MED ORDER — MIDAZOLAM HCL 2 MG/2ML IJ SOLN
INTRAMUSCULAR | Status: AC
  Filled 2018-02-05: qty 2

## 2018-02-05 MED ORDER — ONDANSETRON HCL 4 MG/2ML IJ SOLN: 4.0000 mg | Freq: Once | INTRAMUSCULAR | Status: DC | PRN

## 2018-02-05 MED ORDER — METHOHEXITAL SODIUM 100 MG/10ML IV SOSY
PREFILLED_SYRINGE | INTRAVENOUS | Status: DC | PRN
  Administered 2018-02-05: 120 mg via INTRAVENOUS

## 2018-02-05 NOTE — Anesthesia Post-op Follow-up Note (Signed)
Anesthesia QCDR form completed.        

## 2018-02-05 NOTE — Discharge Instructions (Signed)
1)  The drugs that you have been given will stay in your system until tomorrow so for the       next 24 hours you should not:  A. Drive an automobile  B. Make any legal decisions  C. Drink any alcoholic beverages  2)  You may resume your regular meals upon return home.  3)  A responsible adult must take you home.  Someone should stay with you for a few          hours, then be available by phone for the remainder of the treatment day.  4)  You May experience any of the following symptoms:  Headache, Nausea and a dry mouth (due to the medications you were given),  temporary memory loss and some confusion, or sore muscles (a warm bath  should help this).  If you you experience any of these symptoms let us know on                your return visit.  5)  Report any of the following: any acute discomfort, severe headache, or temperature        greater than 100.5 F.   Also report any unusual redness, swelling, drainage, or pain         at your IV site.    You may report Symptoms to:  [facility] [place] [facility]          Phone: [phone], ECT Department           or Dr. Shary Keylapac's office [phone]  6)  Your next ECT Treatment is Day Friday  Date February 07, 2018 at 0830  We will call 2 days prior to your scheduled appointment for arrival times.  7)  Nothing to eat or drink after midnight the night before your procedure.  8)  Take B/p meds     With a sip of water the morning of your procedure.  9)  Other Instructions: Call [phone] to cancel the morning of your procedure due         to illness or emergency.  10) We will call within 72 hours to assess how you are feeling.

## 2018-02-05 NOTE — Anesthesia Postprocedure Evaluation (Signed)
Anesthesia Post Note  Patient: Kelly Johnston  Procedure(s) Performed: ECT TX  Patient location during evaluation: PACU Anesthesia Type: General Level of consciousness: awake and alert Pain management: pain level controlled Vital Signs Assessment: post-procedure vital signs reviewed and stable Respiratory status: spontaneous breathing and respiratory function stable Cardiovascular status: stable Anesthetic complications: no     Last Vitals:  Vitals:   02/05/18 1130 02/05/18 1140  BP: (!) 150/99 (!) 151/98  Pulse: 89 90  Resp: 16 20  Temp:  (!) 36.3 C  SpO2: 100% 94%    Last Pain:  Vitals:   02/05/18 1140  TempSrc:   PainSc: 0-No pain                 [name] K

## 2018-02-05 NOTE — Procedures (Signed)
ECT SERVICES Physician's Interval Evaluation & Treatment Note  Patient Identification: Kelly Johnston MRN:  [id number] Date of Evaluation:  02/05/2018 TX #: [id number]  MADRS:   MMSE:   P.E. Findings:  No change in physical exam  Psychiatric Interval Note:  No change to psychiatric complaints  Subjective:  Patient is a 47 y.o. male seen for evaluation for Electroconvulsive Therapy. Stable  Treatment Summary:   [x]   Right Unilateral             []  Bilateral   % Energy : 0.3 ms 75%   Impedance: 1160 ohms  Seizure Energy Index: 14,325 V squared  Postictal Suppression Index: 93%  Seizure Concordance Index: 99%  Medications  Pre Shock: Robinul 0.2 mg labetalol 20 mg Brevital 120 mg succinylcholine 200 mg  Post Shock: Versed 2 mg  Seizure Duration: 35 seconds by EMG 40 seconds by EEG   Comments: Follow-up Friday  Lungs:  [x]   Clear to auscultation               []  Other:   Heart:    [x]   Regular rhythm             []  irregular rhythm    [x]   Previous H&P reviewed, patient examined and there are NO CHANGES                 []   Previous H&P reviewed, patient examined and there are changes noted.   Kelly RasmussenJohn Jaber Dunlow, MD 12/11/201912:52 PM

## 2018-02-05 NOTE — Transfer of Care (Signed)
Immediate Anesthesia Transfer of Care Note  Patient: Kelly Johnston  Procedure(s) Performed: ECT TX  Patient Location: PACU  Anesthesia Type:General  Level of Consciousness: sedated  Airway & Oxygen Therapy: Patient Spontanous Breathing and Patient connected to face mask oxygen  Post-op Assessment: Report given to RN and Post -op Vital signs reviewed and stable  Post vital signs: Reviewed and stable  Last Vitals:  Vitals Value Taken Time  BP 155/106 02/05/2018 11:21 AM  Temp 36.4 C 02/05/2018 11:21 AM  Pulse 89 02/05/2018 11:25 AM  Resp 21 02/05/2018 11:25 AM  SpO2 95 % 02/05/2018 11:25 AM  Vitals shown include unvalidated device data.  Last Pain:  Vitals:   02/05/18 1121  TempSrc:   PainSc: Asleep         Complications: No apparent anesthesia complications

## 2018-02-05 NOTE — Anesthesia Procedure Notes (Signed)
Procedure Name: LMA Insertion Date/Time: 02/05/2018 11:04 AM Performed by: Lily KocherPeralta, Grantham Hippert, CRNA Pre-anesthesia Checklist: Patient identified, Patient being monitored, Timeout performed, Emergency Drugs available and Suction available Patient Re-evaluated:Patient Re-evaluated prior to induction Oxygen Delivery Method: Circle system utilized Preoxygenation: Pre-oxygenation with 100% oxygen Induction Type: IV induction Ventilation: Mask ventilation with difficulty LMA: LMA inserted LMA Size: 4.0 Tube type: Oral Number of attempts: 1 Placement Confirmation: positive ETCO2 and breath sounds checked- equal and bilateral Tube secured with: Tape Dental Injury: Teeth and Oropharynx as per pre-operative assessment

## 2018-02-05 NOTE — Anesthesia Preprocedure Evaluation (Signed)
Anesthesia Evaluation  Patient identified by MRN, date of birth, ID band Patient awake    Reviewed: Allergy & Precautions, NPO status , Patient's Chart, lab work & pertinent test results  History of Anesthesia Complications Negative for: history of anesthetic complications  Airway Mallampati: III  TM Distance: >3 FB Neck ROM: Full    Dental  (+) Poor Dentition   Pulmonary sleep apnea and Continuous Positive Airway Pressure Ventilation , neg COPD,    breath sounds clear to auscultation- rhonchi (-) wheezing      Cardiovascular Exercise Tolerance: Good hypertension, Pt. on medications (-) CAD, (-) Past MI, (-) Cardiac Stents and (-) CABG  Rhythm:Regular Rate:Normal - Systolic murmurs and - Diastolic murmurs    Neuro/Psych neg Seizures PSYCHIATRIC DISORDERS Anxiety Depression negative neurological ROS     GI/Hepatic negative GI ROS, Neg liver ROS,   Endo/Other  neg diabetesHypothyroidism   Renal/GU Renal disease     Musculoskeletal negative musculoskeletal ROS (+)   Abdominal (+) + obese,   Peds  Hematology negative hematology ROS (+)   Anesthesia Other Findings Past Medical History: No date: Anxiety No date: Benzodiazepine withdrawal (HCC) No date: Chronic kidney disease     Comment:  protein builds up No date: Depression No date: Heart murmur No date: Hypertension No date: IgA nephropathy No date: Sleep apnea     Comment:  cpap   Reproductive/Obstetrics                             Anesthesia Physical  Anesthesia Plan  ASA: II  Anesthesia Plan: General   Post-op Pain Management:    Induction: Intravenous  PONV Risk Score and Plan: 1 and Midazolam  Airway Management Planned: Mask  Additional Equipment:   Intra-op Plan:   Post-operative Plan:   Informed Consent: I have reviewed the patients History and Physical, chart, labs and discussed the procedure including the  risks, benefits and alternatives for the proposed anesthesia with the patient or authorized representative who has indicated his/her understanding and acceptance.   Dental advisory given  Plan Discussed with: CRNA and Anesthesiologist  Anesthesia Plan Comments:         Anesthesia Quick Evaluation  

## 2018-02-05 NOTE — H&P (Signed)
Kelly Johnston is an 47 y.o. male.   Chief Complaint: Patient continues to feel mildly depressed no complaints of any side effects after the last treatment HPI: History of recurrent severe depression  Past Medical History:  Diagnosis Date  . Anxiety   . Benzodiazepine withdrawal (HCC)   . Chronic kidney disease    protein builds up  . Depression   . Heart murmur   . Hypertension   . IgA nephropathy   . Sleep apnea    cpap    Past Surgical History:  Procedure Laterality Date  . chest surgery    . INCISION AND DRAINAGE ABSCESS  01/16/2012   Procedure: INCISION AND DRAINAGE ABSCESS;  Surgeon: Wilmon ArmsMatthew K. Corliss Skainssuei, MD;  Location: [facility];  Service: General;  Laterality: Left;  . PECTUS EXCAVATUM REPAIR      Family History  Problem Relation Age of Onset  . Testicular cancer Father   . Alcohol abuse Cousin    Social History:  reports that Kelly Johnston has never smoked. Kelly Johnston has never used smokeless tobacco. Kelly Johnston reports that Kelly Johnston has current or past drug history. Drug: Marijuana. Kelly Johnston reports that Kelly Johnston does not drink alcohol.  Allergies:  Allergies  Allergen Reactions  . Latex Rash     (Not in a hospital admission)  Results for orders placed or performed during the hospital encounter of 02/05/18 (from the past 48 hour(s))  Pregnancy, urine POC     Status: None   Collection Time: 02/05/18 12:15 PM  Result Value Ref Range   Preg Test, Ur NEGATIVE NEGATIVE    Comment:        THE SENSITIVITY OF THIS METHODOLOGY IS >24 mIU/mL    No results found.  Review of Systems  Constitutional: Negative.   HENT: Negative.   Eyes: Negative.   Respiratory: Negative.   Cardiovascular: Negative.   Gastrointestinal: Negative.   Musculoskeletal: Negative.   Skin: Negative.   Neurological: Negative.   Psychiatric/Behavioral: Positive for depression. Negative for hallucinations, substance abuse and suicidal ideas. The patient is not nervous/anxious.     Blood pressure (!) 144/94, pulse 86, temperature (!)  97.4 F (36.3 C), resp. rate 18, SpO2 94 %. Physical Exam  Nursing note and vitals reviewed. Constitutional: Kelly Johnston appears well-developed and well-nourished.  HENT:  Head: Normocephalic and atraumatic.  Eyes: Pupils are equal, round, and reactive to light. Conjunctivae are normal.  Neck: Normal range of motion.  Cardiovascular: Regular rhythm and normal heart sounds.  Respiratory: Effort normal. No respiratory distress.  GI: Soft.  Musculoskeletal: Normal range of motion.  Neurological: Kelly Johnston is alert.  Skin: Skin is warm and dry.  Psychiatric: Judgment normal. His affect is blunt. His speech is delayed. Kelly Johnston is slowed. Cognition and memory are normal. Kelly Johnston expresses no suicidal ideation.     Assessment/Plan Continue plan for ECT treatments.  We were interrupted by his brief hospitalization and are more or less starting over again  Mordecai RasmussenJohn Amos Micheals, MD 02/05/2018, 12:50 PM

## 2018-02-06 ENCOUNTER — Other Ambulatory Visit: Payer: Self-pay | Admitting: Psychiatry

## 2018-02-07 ENCOUNTER — Encounter (HOSPITAL_BASED_OUTPATIENT_CLINIC_OR_DEPARTMENT_OTHER)
Admission: RE | Admit: 2018-02-07 | Discharge: 2018-02-07 | Disposition: A | Discharge status: Home / Self Care | Payer: BLUE CROSS/BLUE SHIELD | Source: Ambulatory Visit | Attending: Psychiatry | Admitting: Psychiatry

## 2018-02-07 ENCOUNTER — Encounter: Payer: Self-pay | Admitting: Anesthesiology

## 2018-02-07 DIAGNOSIS — R45851 Suicidal ideations: Secondary | ICD-10-CM | POA: Diagnosis not present

## 2018-02-07 DIAGNOSIS — G4730 Sleep apnea, unspecified: Secondary | ICD-10-CM | POA: Diagnosis not present

## 2018-02-07 DIAGNOSIS — Z91040 Latex allergy status: Secondary | ICD-10-CM | POA: Diagnosis not present

## 2018-02-07 DIAGNOSIS — I129 Hypertensive chronic kidney disease with stage 1 through stage 4 chronic kidney disease, or unspecified chronic kidney disease: Secondary | ICD-10-CM | POA: Diagnosis not present

## 2018-02-07 DIAGNOSIS — F339 Major depressive disorder, recurrent, unspecified: Secondary | ICD-10-CM | POA: Diagnosis not present

## 2018-02-07 DIAGNOSIS — F332 Major depressive disorder, recurrent severe without psychotic features: Secondary | ICD-10-CM

## 2018-02-07 DIAGNOSIS — F419 Anxiety disorder, unspecified: Secondary | ICD-10-CM | POA: Diagnosis not present

## 2018-02-07 DIAGNOSIS — F418 Other specified anxiety disorders: Secondary | ICD-10-CM | POA: Diagnosis not present

## 2018-02-07 DIAGNOSIS — N189 Chronic kidney disease, unspecified: Secondary | ICD-10-CM | POA: Diagnosis not present

## 2018-02-07 DIAGNOSIS — E039 Hypothyroidism, unspecified: Secondary | ICD-10-CM | POA: Diagnosis not present

## 2018-02-07 MED ORDER — KETOROLAC TROMETHAMINE 30 MG/ML IJ SOLN
30.0000 mg | Freq: Once | INTRAMUSCULAR | Status: AC
  Administered 2018-02-07: 30 mg via INTRAVENOUS

## 2018-02-07 MED ORDER — KETOROLAC TROMETHAMINE 30 MG/ML IJ SOLN
INTRAMUSCULAR | Status: AC
  Administered 2018-02-07: 30 mg via INTRAVENOUS
  Filled 2018-02-07: qty 1

## 2018-02-07 MED ORDER — MIDAZOLAM HCL 2 MG/2ML IJ SOLN
INTRAMUSCULAR | Status: AC
  Filled 2018-02-07: qty 2

## 2018-02-07 MED ORDER — ONDANSETRON HCL 4 MG/2ML IJ SOLN
INTRAMUSCULAR | Status: AC
  Administered 2018-02-07: 4 mg via INTRAVENOUS
  Filled 2018-02-07: qty 2

## 2018-02-07 MED ORDER — SODIUM CHLORIDE 0.9 % IV SOLN
500.0000 mL | Freq: Once | INTRAVENOUS | Status: AC
  Administered 2018-02-07: 500 mL via INTRAVENOUS

## 2018-02-07 MED ORDER — SUCCINYLCHOLINE CHLORIDE 20 MG/ML IJ SOLN
INTRAMUSCULAR | Status: DC | PRN
  Administered 2018-02-07: 250 mg via INTRAVENOUS

## 2018-02-07 MED ORDER — SODIUM CHLORIDE 0.9 % IV SOLN
INTRAVENOUS | Status: DC | PRN
  Administered 2018-02-07: 12:00:00 via INTRAVENOUS

## 2018-02-07 MED ORDER — SUCCINYLCHOLINE CHLORIDE 20 MG/ML IJ SOLN
INTRAMUSCULAR | Status: AC
  Filled 2018-02-07: qty 1

## 2018-02-07 MED ORDER — LABETALOL HCL 5 MG/ML IV SOLN
INTRAVENOUS | Status: AC
  Filled 2018-02-07: qty 4

## 2018-02-07 MED ORDER — ONDANSETRON HCL 4 MG/2ML IJ SOLN
4.0000 mg | Freq: Once | INTRAMUSCULAR | Status: AC
  Administered 2018-02-07: 4 mg via INTRAVENOUS

## 2018-02-07 MED ORDER — GLYCOPYRROLATE 0.2 MG/ML IJ SOLN
0.4000 mg | Freq: Once | INTRAMUSCULAR | Status: AC
  Administered 2018-02-07: 0.4 mg via INTRAVENOUS

## 2018-02-07 MED ORDER — LABETALOL HCL 5 MG/ML IV SOLN
INTRAVENOUS | Status: DC | PRN
  Administered 2018-02-07: 20 mg via INTRAVENOUS

## 2018-02-07 MED ORDER — MIDAZOLAM HCL 2 MG/2ML IJ SOLN
2.0000 mg | Freq: Once | INTRAMUSCULAR | Status: AC
  Administered 2018-02-07: 2 mg via INTRAVENOUS

## 2018-02-07 MED ORDER — GLYCOPYRROLATE 0.2 MG/ML IJ SOLN
INTRAMUSCULAR | Status: AC
  Administered 2018-02-07: 0.4 mg via INTRAVENOUS
  Filled 2018-02-07: qty 2

## 2018-02-07 MED ORDER — METHOHEXITAL SODIUM 100 MG/10ML IV SOSY
PREFILLED_SYRINGE | INTRAVENOUS | Status: DC | PRN
  Administered 2018-02-07: 60 mg via INTRAVENOUS
  Administered 2018-02-07: 130 mg via INTRAVENOUS

## 2018-02-07 NOTE — Transfer of Care (Signed)
Immediate Anesthesia Transfer of Care Note  Patient: Kelly Johnston  Procedure(s) Performed: ECT TX  Patient Location: PACU  Anesthesia Type:General  Level of Consciousness: awake and oriented  Airway & Oxygen Therapy: Patient Spontanous Breathing and Patient connected to face mask oxygen  Post-op Assessment: Report given to RN and Post -op Vital signs reviewed and stable  Post vital signs: Reviewed and stable  Last Vitals:  Vitals Value Taken Time  BP 168/113 02/07/2018 11:59 AM  Temp 37 C 02/07/2018 11:58 AM  Pulse 93 02/07/2018 12:01 PM  Resp 22 02/07/2018 12:01 PM  SpO2 97 % 02/07/2018 12:01 PM  Vitals shown include unvalidated device data.  Last Pain:  Vitals:   02/07/18 1158  TempSrc:   PainSc: Asleep         Complications: No apparent anesthesia complications

## 2018-02-07 NOTE — Anesthesia Procedure Notes (Signed)
Procedure Name: LMA Insertion Date/Time: 02/07/2018 11:42 AM Performed by: Henrietta HooverPope, Camiyah Friberg, CRNA Pre-anesthesia Checklist: Patient identified, Patient being monitored, Timeout performed, Emergency Drugs available and Suction available Patient Re-evaluated:Patient Re-evaluated prior to induction Oxygen Delivery Method: Circle system utilized Preoxygenation: Pre-oxygenation with 100% oxygen Induction Type: IV induction Ventilation: Mask ventilation without difficulty LMA: LMA inserted LMA Size: 5.0 Tube type: Oral Number of attempts: 1 Placement Confirmation: positive ETCO2 and breath sounds checked- equal and bilateral Tube secured with: Tape Dental Injury: Teeth and Oropharynx as per pre-operative assessment

## 2018-02-07 NOTE — Anesthesia Postprocedure Evaluation (Signed)
Anesthesia Post Note  Patient: Kelly Johnston  Procedure(s) Performed: ECT TX  Patient location during evaluation: PACU Anesthesia Type: General Level of consciousness: awake and alert Pain management: pain level controlled Vital Signs Assessment: post-procedure vital signs reviewed and stable Respiratory status: spontaneous breathing, nonlabored ventilation and respiratory function stable Cardiovascular status: blood pressure returned to baseline and stable Postop Assessment: no signs of nausea or vomiting Anesthetic complications: no     Last Vitals:  Vitals:   02/07/18 1228 02/07/18 1238  BP: (!) 147/99 (!) 139/91  Pulse: 93 81  Resp: 17 16  Temp: 36.9 C   SpO2: 94%     Last Pain:  Vitals:   02/07/18 1238  TempSrc:   PainSc: 0-No pain                 Resha Filippone

## 2018-02-07 NOTE — Discharge Instructions (Signed)
1)  The drugs that you have been given will stay in your system until tomorrow so for the       next 24 hours you should not:  A. Drive an automobile  B. Make any legal decisions  C. Drink any alcoholic beverages  2)  You may resume your regular meals upon return home.  3)  A responsible adult must take you home.  Someone should stay with you for a few          hours, then be available by phone for the remainder of the treatment day.  4)  You May experience any of the following symptoms:  Headache, Nausea and a dry mouth (due to the medications you were given),  temporary memory loss and some confusion, or sore muscles (a warm bath  should help this).  If you you experience any of these symptoms let us know on                your return visit.  5)  Report any of the following: any acute discomfort, severe headache, or temperature        greater than 100.5 F.   Also report any unusual redness, swelling, drainage, or pain         at your IV site.    You may report Symptoms to:  [facility]          Phone: [phone], ECT Department           or Dr. Shary Keylapac's office [phone]  6)  Your next ECT Treatment is Monday December 16 at 8:30   We will call 2 days prior to your scheduled appointment for arrival times.  7)  Nothing to eat or drink after midnight the night before your procedure.  8)  Take    With a sip of water the morning of your procedure.  9)  Other Instructions: Call [phone] to cancel the morning of your procedure due         to illness or emergency.  10) We will call within 72 hours to assess how you are feeling.

## 2018-02-07 NOTE — H&P (Signed)
Kelly Johnston is an 47 y.o. male.   Chief Complaint: feeling better HPI: recurrent depression  Past Medical History:  Diagnosis Date  . Anxiety   . Benzodiazepine withdrawal (HCC)   . Chronic kidney disease    protein builds up  . Depression   . Heart murmur   . Hypertension   . IgA nephropathy   . Sleep apnea    cpap    Past Surgical History:  Procedure Laterality Date  . chest surgery    . INCISION AND DRAINAGE ABSCESS  01/16/2012   Procedure: INCISION AND DRAINAGE ABSCESS;  Surgeon: Wilmon ArmsMatthew K. Corliss Skainssuei, MD;  Location: [facility];  Service: General;  Laterality: Left;  . PECTUS EXCAVATUM REPAIR      Family History  Problem Relation Age of Onset  . Testicular cancer Father   . Alcohol abuse Cousin    Social History:  reports that he has never smoked. He has never used smokeless tobacco. He reports previous drug use. Drug: Marijuana. He reports that he does not drink alcohol.  Allergies:  Allergies  Allergen Reactions  . Latex Rash    (Not in a hospital admission)   Results for orders placed or performed during the hospital encounter of 02/05/18 (from the past 48 hour(s))  Pregnancy, urine POC     Status: None   Collection Time: 02/05/18 12:15 PM  Result Value Ref Range   Preg Test, Ur NEGATIVE NEGATIVE    Comment:        THE SENSITIVITY OF THIS METHODOLOGY IS >24 mIU/mL    No results found.  Review of Systems  Constitutional: Negative.   HENT: Negative.   Eyes: Negative.   Respiratory: Negative.   Cardiovascular: Negative.   Gastrointestinal: Negative.   Musculoskeletal: Negative.   Skin: Negative.   Neurological: Negative.   Psychiatric/Behavioral: Negative.     Blood pressure (!) 154/96, pulse 86, temperature (!) 97.5 F (36.4 C), temperature source Oral, resp. rate 16, height 6\' 5"  (1.956 m), weight (!) 140.6 kg, SpO2 98 %. Physical Exam  Nursing note and vitals reviewed. Constitutional: He appears well-developed and well-nourished.  HENT:  Head:  Normocephalic and atraumatic.  Eyes: Pupils are equal, round, and reactive to light. Conjunctivae are normal.  Neck: Normal range of motion.  Cardiovascular: Regular rhythm and normal heart sounds.  Respiratory: Effort normal. No respiratory distress.  GI: Soft.  Musculoskeletal: Normal range of motion.  Neurological: He is alert.  Skin: Skin is warm and dry.  Psychiatric: He has a normal mood and affect. His speech is normal and behavior is normal. Judgment and thought content normal. Cognition and memory are normal.     Assessment/Plan Con index course next week  [name], MD 02/07/2018, 11:25 AM

## 2018-02-07 NOTE — Anesthesia Post-op Follow-up Note (Signed)
Anesthesia QCDR form completed.        

## 2018-02-07 NOTE — Anesthesia Preprocedure Evaluation (Addendum)
Anesthesia Evaluation  Patient identified by MRN, date of birth, ID band Patient awake    Reviewed: Allergy & Precautions, NPO status , Patient's Chart, lab work & pertinent test results  History of Anesthesia Complications Negative for: history of anesthetic complications  Airway Mallampati: III  TM Distance: >3 FB Neck ROM: Full    Dental  (+) Poor Dentition   Pulmonary sleep apnea and Continuous Positive Airway Pressure Ventilation , neg COPD,    breath sounds clear to auscultation- rhonchi (-) wheezing      Cardiovascular Exercise Tolerance: Good hypertension, Pt. on medications (-) CAD, (-) Past MI, (-) Cardiac Stents and (-) CABG  Rhythm:Regular Rate:Normal - Systolic murmurs and - Diastolic murmurs    Neuro/Psych neg Seizures PSYCHIATRIC DISORDERS Anxiety Depression negative neurological ROS     GI/Hepatic negative GI ROS, Neg liver ROS,   Endo/Other  neg diabetesHypothyroidism   Renal/GU Renal disease     Musculoskeletal negative musculoskeletal ROS (+)   Abdominal (+) + obese,   Peds  Hematology negative hematology ROS (+)   Anesthesia Other Findings Past Medical History: No date: Anxiety No date: Benzodiazepine withdrawal (HCC) No date: Chronic kidney disease     Comment:  protein builds up No date: Depression No date: Heart murmur No date: Hypertension No date: IgA nephropathy No date: Sleep apnea     Comment:  cpap   Reproductive/Obstetrics                             Anesthesia Physical  Anesthesia Plan  ASA: II  Anesthesia Plan: General   Post-op Pain Management:    Induction: Intravenous  PONV Risk Score and Plan: 1 and Midazolam  Airway Management Planned: Mask  Additional Equipment:   Intra-op Plan:   Post-operative Plan:   Informed Consent: I have reviewed the patients History and Physical, chart, labs and discussed the procedure including the  risks, benefits and alternatives for the proposed anesthesia with the patient or authorized representative who has indicated his/her understanding and acceptance.   Dental advisory given  Plan Discussed with: CRNA and Anesthesiologist  Anesthesia Plan Comments:         Anesthesia Quick Evaluation  

## 2018-02-07 NOTE — Procedures (Signed)
ECT SERVICES Physician's Interval Evaluation & Treatment Note  Patient Identification: Kelly Johnston MRN:  [id number] Date of Evaluation:  02/07/2018 TX #: 6  MADRS:   MMSE:   P.E. Findings:  stable  Psychiatric Interval Note:  better  Subjective:  Patient is a 47 y.o. male seen for evaluation for Electroconvulsive Therapy. better  Treatment Summary:   []   Right Unilateral             []  Bilateral   % Energy : 0.553ms,75%   Impedance: 610   Seizure Energy Index: [id number]  Postictal Suppression Index: 87  Seizure Concordance Index: 98  Medications  Pre Shock: r 0.2 lab 20 brev 180 suc 250  Post Shock: ver2  Seizure Duration: 20 by moter 33 by eeg   Comments: Fu mon   Lungs:  [x]   Clear to auscultation               []  Other:   Heart:    [x]   Regular rhythm             []  irregular rhythm    [x]   Previous H&P reviewed, patient examined and there are NO CHANGES                 []   Previous H&P reviewed, patient examined and there are changes noted.   Mordecai RasmussenJohn Kimberlie Csaszar, MD 12/13/201911:26 AM

## 2018-02-09 ENCOUNTER — Other Ambulatory Visit: Payer: Self-pay | Admitting: Psychiatry

## 2018-02-10 ENCOUNTER — Telehealth: Payer: Self-pay

## 2018-02-11 ENCOUNTER — Telehealth: Payer: Self-pay | Admitting: Psychiatry

## 2018-02-11 MED ORDER — LORAZEPAM 0.5 MG PO TABS: ORAL_TABLET | ORAL | 0 refills | Status: DC

## 2018-02-11 NOTE — Telephone Encounter (Signed)
Patient stated medication ineffective. Requesting to be seen before 12/27 appointment to find a resolution. If can be squeezed in earlier I would greatly appreciate it.

## 2018-02-11 NOTE — Telephone Encounter (Signed)
Says he's having panic attacks and suicidal thoughts past few days. Not feeling himself. Meds feel off.   He says he doesn't have any of the hydralazine 50mg  tid.   Please advise, he was making sense in the conversation.

## 2018-02-11 NOTE — Telephone Encounter (Signed)
RTC heavy weight on chest, deprssed, stressed out, anxious.  Hard time managing.  Feels more immediate than prior depression and anxiety.  Does feel more physical and panic attacks for about a week.  Sleep is ok.  ? Related to the rapid increase in the venlafaxine.  SI "on my mind" fleeting.  He thinks the anxiety is driving this possibly.  Not taking anything specifically for anxiety.  Main anxious thought about his work and fear of not getting more.  3 ECT treatments and 1 is tomorrow.    Venlafxine may be increasing anxiety as it can do this in patients susceptible to panic whenever the dosage is increased too rapidly.  Therefore drop the dosage back to 75 mg daily.  We use lorazepam 0.5 mg 1-2 as needed panic.  Do not exceed the dosage recommended  Discussed safety plan at length with patient.  Advised patient to contact office with any worsening signs and symptoms.  In[facility] if experiencing any acute safety concerns, to include suicidal intent. Safety.  Consider another antipsychotic with antianxiety effects if the lorazepam is not effective quickly.  Call us back in 2 days if you not feeling better.  He will continue the ECT.  He agrees to the plan

## 2018-02-12 ENCOUNTER — Encounter: Payer: Self-pay | Admitting: Anesthesiology

## 2018-02-12 ENCOUNTER — Ambulatory Visit
Admission: RE | Admit: 2018-02-12 | Discharge: 2018-02-12 | Disposition: A | Discharge status: Home / Self Care | Payer: BLUE CROSS/BLUE SHIELD | Source: Ambulatory Visit | Attending: Psychiatry | Admitting: Psychiatry

## 2018-02-12 ENCOUNTER — Other Ambulatory Visit: Payer: Self-pay | Admitting: Psychiatry

## 2018-02-12 DIAGNOSIS — R011 Cardiac murmur, unspecified: Secondary | ICD-10-CM | POA: Insufficient documentation

## 2018-02-12 DIAGNOSIS — F418 Other specified anxiety disorders: Secondary | ICD-10-CM | POA: Diagnosis not present

## 2018-02-12 DIAGNOSIS — Z8043 Family history of malignant neoplasm of testis: Secondary | ICD-10-CM | POA: Diagnosis not present

## 2018-02-12 DIAGNOSIS — F332 Major depressive disorder, recurrent severe without psychotic features: Secondary | ICD-10-CM

## 2018-02-12 DIAGNOSIS — Z91040 Latex allergy status: Secondary | ICD-10-CM | POA: Diagnosis not present

## 2018-02-12 DIAGNOSIS — N189 Chronic kidney disease, unspecified: Secondary | ICD-10-CM | POA: Insufficient documentation

## 2018-02-12 DIAGNOSIS — G4730 Sleep apnea, unspecified: Secondary | ICD-10-CM | POA: Diagnosis not present

## 2018-02-12 DIAGNOSIS — R569 Unspecified convulsions: Secondary | ICD-10-CM | POA: Diagnosis present

## 2018-02-12 DIAGNOSIS — Z811 Family history of alcohol abuse and dependence: Secondary | ICD-10-CM | POA: Insufficient documentation

## 2018-02-12 DIAGNOSIS — I129 Hypertensive chronic kidney disease with stage 1 through stage 4 chronic kidney disease, or unspecified chronic kidney disease: Secondary | ICD-10-CM | POA: Insufficient documentation

## 2018-02-12 DIAGNOSIS — F419 Anxiety disorder, unspecified: Secondary | ICD-10-CM | POA: Diagnosis not present

## 2018-02-12 DIAGNOSIS — F339 Major depressive disorder, recurrent, unspecified: Secondary | ICD-10-CM | POA: Insufficient documentation

## 2018-02-12 MED ORDER — SODIUM CHLORIDE 0.9 % IV SOLN: 500.0000 mL | Freq: Once | INTRAVENOUS | Status: DC

## 2018-02-12 MED ORDER — LABETALOL HCL 5 MG/ML IV SOLN
INTRAVENOUS | Status: DC | PRN
  Administered 2018-02-12: 30 mg via INTRAVENOUS

## 2018-02-12 MED ORDER — SODIUM CHLORIDE 0.9 % IV SOLN
500.0000 mL | Freq: Once | INTRAVENOUS | Status: AC
  Administered 2018-02-12: 500 mL via INTRAVENOUS

## 2018-02-12 MED ORDER — SUCCINYLCHOLINE CHLORIDE 20 MG/ML IJ SOLN
INTRAMUSCULAR | Status: DC | PRN
  Administered 2018-02-12: 250 mg via INTRAVENOUS

## 2018-02-12 MED ORDER — MIDAZOLAM HCL 2 MG/2ML IJ SOLN: 2.0000 mg | Freq: Once | INTRAMUSCULAR | Status: DC

## 2018-02-12 MED ORDER — METHOHEXITAL SODIUM 100 MG/10ML IV SOSY
PREFILLED_SYRINGE | INTRAVENOUS | Status: DC | PRN
  Administered 2018-02-12: 180 mg via INTRAVENOUS

## 2018-02-12 MED ORDER — GLYCOPYRROLATE 0.2 MG/ML IJ SOLN
INTRAMUSCULAR | Status: AC
  Administered 2018-02-12: 0.4 mg via INTRAVENOUS
  Filled 2018-02-12: qty 2

## 2018-02-12 MED ORDER — METHOHEXITAL SODIUM 0.5 G IJ SOLR
INTRAMUSCULAR | Status: AC
  Filled 2018-02-12: qty 500

## 2018-02-12 MED ORDER — KETOROLAC TROMETHAMINE 30 MG/ML IJ SOLN
INTRAMUSCULAR | Status: AC
  Administered 2018-02-12: 30 mg via INTRAVENOUS
  Filled 2018-02-12: qty 1

## 2018-02-12 MED ORDER — MIDAZOLAM HCL 2 MG/2ML IJ SOLN
2.0000 mg | Freq: Once | INTRAMUSCULAR | Status: AC
  Administered 2018-02-12: 2 mg via INTRAVENOUS

## 2018-02-12 MED ORDER — MIDAZOLAM HCL 2 MG/2ML IJ SOLN
INTRAMUSCULAR | Status: AC
  Filled 2018-02-12: qty 2

## 2018-02-12 MED ORDER — LABETALOL HCL 5 MG/ML IV SOLN
INTRAVENOUS | Status: AC
  Filled 2018-02-12: qty 4

## 2018-02-12 MED ORDER — GLYCOPYRROLATE 0.2 MG/ML IJ SOLN: 0.4000 mg | Freq: Once | INTRAMUSCULAR | Status: DC

## 2018-02-12 MED ORDER — KETOROLAC TROMETHAMINE 30 MG/ML IJ SOLN
30.0000 mg | Freq: Once | INTRAMUSCULAR | Status: AC
  Administered 2018-02-12: 30 mg via INTRAVENOUS

## 2018-02-12 MED ORDER — GLYCOPYRROLATE 0.2 MG/ML IJ SOLN
0.4000 mg | Freq: Once | INTRAMUSCULAR | Status: AC
  Administered 2018-02-12: 0.4 mg via INTRAVENOUS

## 2018-02-12 MED ORDER — SUCCINYLCHOLINE CHLORIDE 20 MG/ML IJ SOLN
INTRAMUSCULAR | Status: AC
  Filled 2018-02-12: qty 2

## 2018-02-12 NOTE — Anesthesia Procedure Notes (Signed)
Procedure Name: LMA Insertion Date/Time: 02/12/2018 10:13 AM Performed by: Omer JackWeatherly, Ladarious Kresse, CRNA Pre-anesthesia Checklist: Patient identified, Patient being monitored, Timeout performed, Emergency Drugs available and Suction available Patient Re-evaluated:Patient Re-evaluated prior to induction Oxygen Delivery Method: Circle system utilized Preoxygenation: Pre-oxygenation with 100% oxygen Induction Type: IV induction Ventilation: Mask ventilation without difficulty LMA: LMA inserted LMA Size: 4.0 Tube type: Oral Number of attempts: 1 Placement Confirmation: positive ETCO2 and breath sounds checked- equal and bilateral Tube secured with: Tape Dental Injury: Teeth and Oropharynx as per pre-operative assessment

## 2018-02-12 NOTE — Anesthesia Postprocedure Evaluation (Signed)
Anesthesia Post Note  Patient: Cydney Okhomas E Vandermeer  Procedure(s) Performed: ECT TX  Patient location during evaluation: PACU Anesthesia Type: General Level of consciousness: awake and alert Pain management: pain level controlled Vital Signs Assessment: post-procedure vital signs reviewed and stable Respiratory status: spontaneous breathing, nonlabored ventilation, respiratory function stable and patient connected to nasal cannula oxygen Cardiovascular status: blood pressure returned to baseline and stable Postop Assessment: no apparent nausea or vomiting Anesthetic complications: no     Last Vitals:  Vitals:   02/12/18 1044 02/12/18 1055  BP:  (!) 161/98  Pulse: 94 94  Resp: 17 16  Temp:  37.2 C  SpO2: 98%     Last Pain:  Vitals:   02/12/18 1055  TempSrc: Oral  PainSc: 0-No pain                 Cleda MccreedyJoseph K Piscitello

## 2018-02-12 NOTE — H&P (Signed)
Kelly Johnston is an 47 y.o. male.   Chief Complaint: More anxious the last couple days HPI: Recurrent depression showing partial response with ECT  Past Medical History:  Diagnosis Date  . Anxiety   . Benzodiazepine withdrawal (HCC)   . Chronic kidney disease    protein builds up  . Depression   . Heart murmur   . Hypertension   . IgA nephropathy   . Sleep apnea    cpap    Past Surgical History:  Procedure Laterality Date  . chest surgery    . INCISION AND DRAINAGE ABSCESS  01/16/2012   Procedure: INCISION AND DRAINAGE ABSCESS;  Surgeon: Wilmon ArmsMatthew K. Corliss Skainssuei, MD;  Location: [facility];  Service: General;  Laterality: Left;  . PECTUS EXCAVATUM REPAIR      Family History  Problem Relation Age of Onset  . Testicular cancer Father   . Alcohol abuse Cousin    Social History:  reports that he has never smoked. He has never used smokeless tobacco. He reports previous drug use. Drug: Marijuana. He reports that he does not drink alcohol.  Allergies:  Allergies  Allergen Reactions  . Latex Rash    (Not in a hospital admission)   No results found for this or any previous visit (from the past 48 hour(s)). No results found.  Review of Systems  Constitutional: Negative.   HENT: Negative.   Eyes: Negative.   Respiratory: Negative.   Cardiovascular: Negative.   Gastrointestinal: Negative.   Musculoskeletal: Negative.   Skin: Negative.   Neurological: Negative.   Psychiatric/Behavioral: Positive for depression. Negative for hallucinations, substance abuse and suicidal ideas. The patient is nervous/anxious.     Blood pressure (!) 162/98, pulse 92, temperature 97.9 F (36.6 C), temperature source Oral, height 6\' 5"  (1.956 m), weight (!) 137.4 kg, SpO2 98 %. Physical Exam  Nursing note and vitals reviewed. Constitutional: He appears well-developed and well-nourished.  HENT:  Head: Normocephalic and atraumatic.  Eyes: Pupils are equal, round, and reactive to light. Conjunctivae  are normal.  Neck: Normal range of motion.  Cardiovascular: Regular rhythm and normal heart sounds.  Respiratory: Effort normal. No respiratory distress.  GI: Soft.  Musculoskeletal: Normal range of motion.  Neurological: He is alert.  Skin: Skin is warm and dry.  Psychiatric: Judgment normal. His mood appears anxious. His speech is delayed. He is slowed. Cognition and memory are normal. He expresses no suicidal ideation.     Assessment/Plan Treatment today and Friday and then follow-up after Christmas  Kelly RasmussenJohn Dontavius Keim, MD 02/12/2018, 10:08 AM

## 2018-02-12 NOTE — Anesthesia Preprocedure Evaluation (Signed)
Anesthesia Evaluation  Patient identified by MRN, date of birth, ID band Patient awake    Reviewed: Allergy & Precautions, H&P , NPO status , Patient's Chart, lab work & pertinent test results  Airway Mallampati: III  TM Distance: >3 FB Neck ROM: full    Dental  (+) Poor Dentition, Chipped   Pulmonary sleep apnea ,           Cardiovascular hypertension, + Valvular Problems/Murmurs (murmur)      Neuro/Psych PSYCHIATRIC DISORDERS negative neurological ROS     GI/Hepatic negative GI ROS, Neg liver ROS,   Endo/Other  Hypothyroidism Morbid obesity  Renal/GU Renal disease  negative genitourinary   Musculoskeletal   Abdominal   Peds  Hematology negative hematology ROS (+)   Anesthesia Other Findings Past Medical History: No date: Anxiety No date: Chronic kidney disease     Comment:  protein builds up No date: Depression No date: Heart murmur No date: Hypertension No date: MRSA (methicillin resistant staph aureus) culture positive No date: Sleep apnea     Comment:  cpap  Past Surgical History: No date: chest surgery 01/16/2012: INCISION AND DRAINAGE ABSCESS     Comment:  Procedure: INCISION AND DRAINAGE ABSCESS;  Surgeon:               Wilmon ArmsMatthew K. Corliss Skainssuei, MD;  Location: [facility];  Service:               General;  Laterality: Left; No date: PECTUS EXCAVATUM REPAIR  BMI    Body Mass Index:  39.13 kg/m      Reproductive/Obstetrics negative OB ROS                             Anesthesia Physical  Anesthesia Plan  ASA: III  Anesthesia Plan: General   Post-op Pain Management:    Induction: Intravenous  PONV Risk Score and Plan:   Airway Management Planned: LMA  Additional Equipment:   Intra-op Plan:   Post-operative Plan: Extubation in OR  Informed Consent: I have reviewed the patients History and Physical, chart, labs and discussed the procedure including the risks, benefits  and alternatives for the proposed anesthesia with the patient or authorized representative who has indicated his/her understanding and acceptance.   Dental Advisory Given  Plan Discussed with: Anesthesiologist, CRNA and Surgeon  Anesthesia Plan Comments: (Patient consented for risks of anesthesia including but not limited to:  - adverse reactions to medications - risk of intubation if required - damage to teeth, lips or other oral mucosa - sore throat or hoarseness - Damage to heart, brain, lungs or loss of life  Patient voiced understanding.)        Anesthesia Quick Evaluation

## 2018-02-12 NOTE — Procedures (Signed)
ECT SERVICES Physician's Interval Evaluation & Treatment Note  Patient Identification: Kelly Johnston MRN:  [id number] Date of Evaluation:  02/12/2018 TX #: 7  MADRS:   MMSE: 30  P.E. Findings:  No change to physical exam  Psychiatric Interval Note:  Mood feeling more anxious recently  Subjective:  Patient is a 47 y.o. male seen for evaluation for Electroconvulsive Therapy. Feeling more anxious and irritable  Treatment Summary:   [x]   Right Unilateral             []  Bilateral   % Energy : 0.3 ms 75%   Impedance: 1060 ohms  Seizure Energy Index: 3422 V squared  Postictal Suppression Index: 89%  Seizure Concordance Index: 98%  Medications  Pre Shock: Robinul 0.4 mg labetalol 20 mg every 1220 mg succinylcholine 250 mg  Post Shock: Versed 2 mg  Seizure Duration: 8 seconds by EMG 31 seconds by EEG   Comments: Next treatment Friday  Lungs:  [x]   Clear to auscultation               []  Other:   Heart:    [x]   Regular rhythm             []  irregular rhythm    [x]   Previous H&P reviewed, patient examined and there are NO CHANGES                 []   Previous H&P reviewed, patient examined and there are changes noted.   Kelly RasmussenJohn Steffi Noviello, MD 12/18/201910:09 AM

## 2018-02-12 NOTE — Anesthesia Post-op Follow-up Note (Signed)
Anesthesia QCDR form completed.        

## 2018-02-12 NOTE — Transfer of Care (Signed)
Immediate Anesthesia Transfer of Care Note  Patient: Kelly Johnston  Procedure(s) Performed: ECT TX  Patient Location: PACU  Anesthesia Type:General  Level of Consciousness: drowsy and patient cooperative  Airway & Oxygen Therapy: Patient Spontanous Breathing and Patient connected to face mask oxygen  Post-op Assessment: Report given to RN and Post -op Vital signs reviewed and stable  Post vital signs: Reviewed and stable  Last Vitals:  Vitals Value Taken Time  BP 161/103 02/12/2018 10:27 AM  Temp    Pulse 96 02/12/2018 10:27 AM  Resp 19 02/12/2018 10:27 AM  SpO2 100 % 02/12/2018 10:27 AM    Last Pain:  Vitals:   02/12/18 0810  TempSrc: Oral  PainSc: 0-No pain         Complications: No apparent anesthesia complications

## 2018-02-12 NOTE — Discharge Instructions (Signed)
1)  The drugs that you have been given will stay in your system until tomorrow so for the       next 24 hours you should not:  A. Drive an automobile  B. Make any legal decisions  C. Drink any alcoholic beverages  2)  You may resume your regular meals upon return home.  3)  A responsible adult must take you home.  Someone should stay with you for a few          hours, then be available by phone for the remainder of the treatment day.  4)  You May experience any of the following symptoms:  Headache, Nausea and a dry mouth (due to the medications you were given),  temporary memory loss and some confusion, or sore muscles (a warm bath  should help this).  If you you experience any of these symptoms let us know on                your return visit.  5)  Report any of the following: any acute discomfort, severe headache, or temperature        greater than 100.5 F.   Also report any unusual redness, swelling, drainage, or pain         at your IV site.    You may report Symptoms to:  ECT PROGRAM- [place] [facility]          Phone: [phone], ECT Department           or Dr. Shary Keylapac's office [phone]  6)  Your next ECT Treatment is Friday December 20 at 8:15  We will call 2 days prior to your scheduled appointment for arrival times.  7)  Nothing to eat or drink after midnight the night before your procedure.  8)  Take LISINOPRIL    With a sip of water the morning of your procedure.  9)  Other Instructions: Call [phone] to cancel the morning of your procedure due         to illness or emergency.  10) We will call within 72 hours to assess how you are feeling.

## 2018-02-13 ENCOUNTER — Other Ambulatory Visit: Payer: Self-pay

## 2018-02-13 ENCOUNTER — Other Ambulatory Visit: Payer: Self-pay | Admitting: Psychiatry

## 2018-02-13 ENCOUNTER — Telehealth: Payer: Self-pay

## 2018-02-13 MED ORDER — OLANZAPINE 10 MG PO TABS: 10.0000 mg | ORAL_TABLET | Freq: Every day | ORAL | 1 refills | Status: DC

## 2018-02-13 NOTE — Telephone Encounter (Signed)
Kelly [facility]) called stating pt is having a hard time, very panicky, sweats, and heart racing. Pt has spoken with provider on Tuesday, 12/17 with some medication changes but not helping. Dr. Jennelle Humanottle recommend he adds Olanzapine 10mg  q hs. Instructed to go ahead and take now when picking up rx to help him relax. [facility] [address] Algis DownsAdvised to call back if worsening symptoms or any suicidal ideations. Verbalized understanding. Pt continues with ECT treatments.

## 2018-02-14 ENCOUNTER — Encounter: Payer: Self-pay | Admitting: Anesthesiology

## 2018-02-14 ENCOUNTER — Encounter (HOSPITAL_BASED_OUTPATIENT_CLINIC_OR_DEPARTMENT_OTHER)
Admission: RE | Admit: 2018-02-14 | Discharge: 2018-02-14 | Disposition: A | Discharge status: Home / Self Care | Payer: BLUE CROSS/BLUE SHIELD | Source: Ambulatory Visit | Attending: Psychiatry | Admitting: Psychiatry

## 2018-02-14 DIAGNOSIS — G4730 Sleep apnea, unspecified: Secondary | ICD-10-CM | POA: Diagnosis not present

## 2018-02-14 DIAGNOSIS — F332 Major depressive disorder, recurrent severe without psychotic features: Secondary | ICD-10-CM | POA: Diagnosis not present

## 2018-02-14 DIAGNOSIS — N189 Chronic kidney disease, unspecified: Secondary | ICD-10-CM | POA: Diagnosis not present

## 2018-02-14 DIAGNOSIS — F418 Other specified anxiety disorders: Secondary | ICD-10-CM | POA: Diagnosis not present

## 2018-02-14 DIAGNOSIS — F419 Anxiety disorder, unspecified: Secondary | ICD-10-CM | POA: Diagnosis not present

## 2018-02-14 DIAGNOSIS — R45851 Suicidal ideations: Secondary | ICD-10-CM | POA: Diagnosis not present

## 2018-02-14 DIAGNOSIS — F339 Major depressive disorder, recurrent, unspecified: Secondary | ICD-10-CM | POA: Diagnosis not present

## 2018-02-14 DIAGNOSIS — Z91040 Latex allergy status: Secondary | ICD-10-CM | POA: Diagnosis not present

## 2018-02-14 DIAGNOSIS — I129 Hypertensive chronic kidney disease with stage 1 through stage 4 chronic kidney disease, or unspecified chronic kidney disease: Secondary | ICD-10-CM | POA: Diagnosis not present

## 2018-02-14 MED ORDER — ONDANSETRON HCL 4 MG/2ML IJ SOLN: 4.0000 mg | Freq: Once | INTRAMUSCULAR | Status: DC | PRN

## 2018-02-14 MED ORDER — MIDAZOLAM HCL 2 MG/2ML IJ SOLN
INTRAMUSCULAR | Status: AC
  Filled 2018-02-14: qty 2

## 2018-02-14 MED ORDER — SUCCINYLCHOLINE CHLORIDE 20 MG/ML IJ SOLN
INTRAMUSCULAR | Status: DC | PRN
  Administered 2018-02-14: 250 mg via INTRAVENOUS

## 2018-02-14 MED ORDER — MIDAZOLAM HCL 2 MG/2ML IJ SOLN
INTRAMUSCULAR | Status: DC | PRN
  Administered 2018-02-14: 2 mg via INTRAVENOUS

## 2018-02-14 MED ORDER — LABETALOL HCL 5 MG/ML IV SOLN
INTRAVENOUS | Status: DC | PRN
  Administered 2018-02-14: 30 mg via INTRAVENOUS

## 2018-02-14 MED ORDER — SODIUM CHLORIDE 0.9 % IV SOLN
500.0000 mL | Freq: Once | INTRAVENOUS | Status: AC
  Administered 2018-02-14: 500 mL via INTRAVENOUS

## 2018-02-14 MED ORDER — METHOHEXITAL SODIUM 100 MG/10ML IV SOSY
PREFILLED_SYRINGE | INTRAVENOUS | Status: DC | PRN
  Administered 2018-02-14: 190 mg via INTRAVENOUS

## 2018-02-14 MED ORDER — FENTANYL CITRATE (PF) 100 MCG/2ML IJ SOLN: 25.0000 ug | INTRAMUSCULAR | Status: DC | PRN

## 2018-02-14 MED ORDER — MIDAZOLAM HCL 2 MG/2ML IJ SOLN: 2.0000 mg | Freq: Once | INTRAMUSCULAR | Status: DC

## 2018-02-14 MED ORDER — SUCCINYLCHOLINE CHLORIDE 20 MG/ML IJ SOLN
INTRAMUSCULAR | Status: AC
  Filled 2018-02-14: qty 1

## 2018-02-14 MED ORDER — KETOROLAC TROMETHAMINE 30 MG/ML IJ SOLN
INTRAMUSCULAR | Status: AC
  Administered 2018-02-14: 30 mg via INTRAVENOUS
  Filled 2018-02-14: qty 1

## 2018-02-14 MED ORDER — GLYCOPYRROLATE 0.2 MG/ML IJ SOLN
0.4000 mg | Freq: Once | INTRAMUSCULAR | Status: AC
  Administered 2018-02-14: 0.4 mg via INTRAVENOUS

## 2018-02-14 MED ORDER — GLYCOPYRROLATE 0.2 MG/ML IJ SOLN
INTRAMUSCULAR | Status: AC
  Filled 2018-02-14: qty 2

## 2018-02-14 MED ORDER — LABETALOL HCL 5 MG/ML IV SOLN
INTRAVENOUS | Status: AC
  Filled 2018-02-14: qty 4

## 2018-02-14 MED ORDER — SODIUM CHLORIDE 0.9 % IV SOLN
INTRAVENOUS | Status: DC | PRN
  Administered 2018-02-14: 10:00:00 via INTRAVENOUS

## 2018-02-14 MED ORDER — KETOROLAC TROMETHAMINE 30 MG/ML IJ SOLN
30.0000 mg | Freq: Once | INTRAMUSCULAR | Status: AC
  Administered 2018-02-14: 30 mg via INTRAVENOUS

## 2018-02-14 NOTE — Anesthesia Post-op Follow-up Note (Signed)
Anesthesia QCDR form completed.        

## 2018-02-14 NOTE — Transfer of Care (Signed)
Immediate Anesthesia Transfer of Care Note  Patient: Kelly Johnston  Procedure(s) Performed: ECT TX  Patient Location: PACU  Anesthesia Type:General  Level of Consciousness: sedated and responds to stimulation  Airway & Oxygen Therapy: Patient Spontanous Breathing and Patient connected to face mask oxygen  Post-op Assessment: Report given to RN and Post -op Vital signs reviewed and stable  Post vital signs: Reviewed and stable  Last Vitals:  Vitals Value Taken Time  BP 135/92 02/14/2018 10:59 AM  Temp    Pulse 104 02/14/2018 10:59 AM  Resp 39 02/14/2018 10:59 AM  SpO2 92 % 02/14/2018 10:59 AM  Vitals shown include unvalidated device data.  Last Pain:  Vitals:   02/14/18 1058  TempSrc:   PainSc: (P) Asleep         Complications: No apparent anesthesia complications

## 2018-02-14 NOTE — H&P (Signed)
Kelly Johnston is an 47 y.o. male.   Chief Complaint: No new complaint.  Feeling a little bit better.  Less anxious. HPI: History of recurrent severe depression  Past Medical History:  Diagnosis Date  . Anxiety   . Benzodiazepine withdrawal (HCC)   . Chronic kidney disease    protein builds up  . Depression   . Heart murmur   . Hypertension   . IgA nephropathy   . Sleep apnea    cpap    Past Surgical History:  Procedure Laterality Date  . chest surgery    . INCISION AND DRAINAGE ABSCESS  01/16/2012   Procedure: INCISION AND DRAINAGE ABSCESS;  Surgeon: Wilmon ArmsMatthew K. Corliss Skainssuei, MD;  Location: [facility];  Service: General;  Laterality: Left;  . PECTUS EXCAVATUM REPAIR      Family History  Problem Relation Age of Onset  . Testicular cancer Father   . Alcohol abuse Cousin    Social History:  reports that he has never smoked. He has never used smokeless tobacco. He reports previous drug use. Drug: Marijuana. He reports that he does not drink alcohol.  Allergies:  Allergies  Allergen Reactions  . Latex Rash    (Not in a hospital admission)   No results found for this or any previous visit (from the past 48 hour(s)). No results found.  Review of Systems  Constitutional: Negative.   HENT: Negative.   Eyes: Negative.   Respiratory: Negative.   Cardiovascular: Negative.   Gastrointestinal: Negative.   Musculoskeletal: Negative.   Skin: Negative.   Neurological: Negative.   Psychiatric/Behavioral: Negative.     Blood pressure (!) 159/98, pulse 92, temperature 98.9 F (37.2 C), temperature source Oral, resp. rate 16, SpO2 98 %. Physical Exam  Nursing note and vitals reviewed. Constitutional: He appears well-developed and well-nourished.  HENT:  Head: Normocephalic and atraumatic.  Eyes: Pupils are equal, round, and reactive to light. Conjunctivae are normal.  Neck: Normal range of motion.  Cardiovascular: Regular rhythm and normal heart sounds.  Respiratory: Effort  normal.  GI: Soft.  Musculoskeletal: Normal range of motion.  Neurological: He is alert.  Skin: Skin is warm and dry.  Psychiatric: He has a normal mood and affect. His behavior is normal. Judgment and thought content normal.     Assessment/Plan Treatment today follow-up in another week after Christmas  [name], MD 02/14/2018, 10:30 AM

## 2018-02-14 NOTE — Anesthesia Postprocedure Evaluation (Signed)
Anesthesia Post Note  Patient: Kelly Johnston  Procedure(s) Performed: ECT TX  Patient location during evaluation: PACU Anesthesia Type: General Level of consciousness: awake and alert Pain management: pain level controlled Vital Signs Assessment: post-procedure vital signs reviewed and stable Respiratory status: spontaneous breathing, nonlabored ventilation, respiratory function stable and patient connected to nasal cannula oxygen Cardiovascular status: blood pressure returned to baseline and stable Postop Assessment: no apparent nausea or vomiting Anesthetic complications: no     Last Vitals:  Vitals:   02/14/18 1059 02/14/18 1102  BP: 135/90 (!) 135/92  Pulse: (!) 105 (!) 102  Resp: (!) 25 (!) 22  Temp:    SpO2: 93% 95%    Last Pain:  Vitals:   02/14/18 1058  TempSrc:   PainSc: Asleep                 [name],[name]

## 2018-02-14 NOTE — Procedures (Signed)
ECT SERVICES Physician's Interval Evaluation & Treatment Note  Patient Identification: Kelly Johnston MRN:  [id number] Date of Evaluation:  02/14/2018 TX #: [id number]  MADRS:   MMSE:   P.E. Findings:  Unchanged physical exam  Psychiatric Interval Note:  Mood is feeling better more relaxed  Subjective:  Patient is a 47 y.o. male seen for evaluation for Electroconvulsive Therapy. Feeling better  Treatment Summary:   [x]   Right Unilateral             []  Bilateral   % Energy : 0.3 ms 100%   Impedance: 940 ohms  Seizure Energy Index: No reading  Postictal Suppression Index: No reading  Seizure Concordance Index: No reading  Medications  Pre Shock: Robinul 0.4 mg labetalol 20 mg Brevital 190 mg succinylcholine 250 mg  Post Shock: Versed 2 mg  Seizure Duration: EMG 8 seconds EEG 26 seconds   Comments: Follow-up on the 30th  Lungs:  [x]   Clear to auscultation               []  Other:   Heart:    [x]   Regular rhythm             []  irregular rhythm    [x]   Previous H&P reviewed, patient examined and there are NO CHANGES                 []   Previous H&P reviewed, patient examined and there are changes noted.   Kelly RasmussenJohn Maclovio Henson, MD 12/20/201910:31 AM

## 2018-02-14 NOTE — Anesthesia Preprocedure Evaluation (Signed)
Anesthesia Evaluation  Patient identified by MRN, date of birth, ID band Patient awake    Reviewed: Allergy & Precautions, NPO status , Patient's Chart, lab work & pertinent test results, reviewed documented beta blocker date and time   Airway Mallampati: III  TM Distance: >3 FB     Dental  (+) Chipped   Pulmonary sleep apnea ,           Cardiovascular hypertension, Pt. on medications      Neuro/Psych PSYCHIATRIC DISORDERS Anxiety Depression    GI/Hepatic   Endo/Other  Hypothyroidism   Renal/GU Renal disease     Musculoskeletal   Abdominal   Peds  Hematology   Anesthesia Other Findings   Reproductive/Obstetrics                             Anesthesia Physical Anesthesia Plan  ASA: III  Anesthesia Plan: General   Post-op Pain Management:    Induction: Intravenous  PONV Risk Score and Plan:   Airway Management Planned:   Additional Equipment:   Intra-op Plan:   Post-operative Plan:   Informed Consent: I have reviewed the patients History and Physical, chart, labs and discussed the procedure including the risks, benefits and alternatives for the proposed anesthesia with the patient or authorized representative who has indicated his/her understanding and acceptance.     Plan Discussed with: CRNA  Anesthesia Plan Comments:         Anesthesia Quick Evaluation

## 2018-02-14 NOTE — Discharge Instructions (Signed)
1)  The drugs that you have been given will stay in your system until tomorrow so for the       next 24 hours you should not:  A. Drive an automobile  B. Make any legal decisions  C. Drink any alcoholic beverages  2)  You may resume your regular meals upon return home.  3)  A responsible adult must take you home.  Someone should stay with you for a few          hours, then be available by phone for the remainder of the treatment day.  4)  You May experience any of the following symptoms:  Headache, Nausea and a dry mouth (due to the medications you were given),  temporary memory loss and some confusion, or sore muscles (a warm bath  should help this).  If you you experience any of these symptoms let us know on                your return visit.  5)  Report any of the following: any acute discomfort, severe headache, or temperature        greater than 100.5 F.   Also report any unusual redness, swelling, drainage, or pain         at your IV site.    You may report Symptoms to:  ECT PROGRAM- [place] [facility]          Phone: [phone], ECT Department           or Dr. Shary Keylapac's office [phone]  6)  Your next ECT Treatment is Monday December 30 at 8:30   We will call 2 days prior to your scheduled appointment for arrival times.  7)  Nothing to eat or drink after midnight the night before your procedure.  8)  Take Lisinopril     With a sip of water the morning of your procedure.  9)  Other Instructions: Call [phone] to cancel the morning of your procedure due         to illness or emergency.  10) We will call within 72 hours to assess how you are feeling.

## 2018-02-14 NOTE — Anesthesia Procedure Notes (Signed)
Procedure Name: LMA Insertion Date/Time: 02/14/2018 10:38 AM Performed by: Omer JackWeatherly, Linnell Swords, CRNA Pre-anesthesia Checklist: Patient identified, Patient being monitored, Timeout performed, Emergency Drugs available and Suction available Patient Re-evaluated:Patient Re-evaluated prior to induction Oxygen Delivery Method: Circle system utilized Preoxygenation: Pre-oxygenation with 100% oxygen Induction Type: IV induction Ventilation: Mask ventilation without difficulty LMA: LMA inserted LMA Size: 4.0 Tube type: Oral Number of attempts: 1 Placement Confirmation: positive ETCO2 and breath sounds checked- equal and bilateral Tube secured with: Tape Dental Injury: Teeth and Oropharynx as per pre-operative assessment

## 2018-02-17 ENCOUNTER — Telehealth: Payer: Self-pay | Admitting: Psychiatry

## 2018-02-17 NOTE — Telephone Encounter (Signed)
RTC re: questions about Effexor dosage.   Dropped back to Effexor 75mg  daily.  No known SE.  Still a "mess" anxious and depressed and having mental issues.    Increase Effexor to 3 of 37.5 mg daily.  More ECT on 12/30.  He agrees.  Appt Friday.  Meredith Staggersarey Cottle, MD, DFAPA

## 2018-02-17 NOTE — Telephone Encounter (Signed)
Pt left message not sure how much Effexor he should be taking. Please advise

## 2018-02-21 ENCOUNTER — Other Ambulatory Visit: Payer: Self-pay | Admitting: Psychiatry

## 2018-02-21 ENCOUNTER — Encounter: Payer: Self-pay | Admitting: Psychiatry

## 2018-02-21 ENCOUNTER — Ambulatory Visit: Payer: BLUE CROSS/BLUE SHIELD | Admitting: Psychiatry

## 2018-02-21 ENCOUNTER — Encounter

## 2018-02-21 DIAGNOSIS — F333 Major depressive disorder, recurrent, severe with psychotic symptoms: Secondary | ICD-10-CM | POA: Diagnosis not present

## 2018-02-21 DIAGNOSIS — F411 Generalized anxiety disorder: Secondary | ICD-10-CM | POA: Diagnosis not present

## 2018-02-21 DIAGNOSIS — R7989 Other specified abnormal findings of blood chemistry: Secondary | ICD-10-CM

## 2018-02-21 MED ORDER — LISINOPRIL 40 MG PO TABS: 40.0000 mg | ORAL_TABLET | Freq: Every day | ORAL | 1 refills | Status: DC

## 2018-02-21 MED ORDER — LORAZEPAM 0.5 MG PO TABS: ORAL_TABLET | ORAL | 1 refills | Status: DC

## 2018-02-21 MED ORDER — VITAMIN D (ERGOCALCIFEROL) 1.25 MG (50000 UNIT) PO CAPS: 50000.0000 [IU] | ORAL_CAPSULE | ORAL | 2 refills | Status: DC

## 2018-02-21 MED ORDER — LIOTHYRONINE SODIUM 25 MCG PO TABS: 50.0000 ug | ORAL_TABLET | Freq: Every day | ORAL | 1 refills | Status: DC

## 2018-02-21 MED ORDER — MIRTAZAPINE 45 MG PO TABS: 45.0000 mg | ORAL_TABLET | Freq: Every day | ORAL | 1 refills | Status: DC

## 2018-02-21 MED ORDER — HYDRALAZINE HCL 50 MG PO TABS: 50.0000 mg | ORAL_TABLET | Freq: Three times a day (TID) | ORAL | 1 refills | Status: DC

## 2018-02-21 MED ORDER — OLANZAPINE 10 MG PO TABS: 10.0000 mg | ORAL_TABLET | Freq: Every day | ORAL | 1 refills | Status: DC

## 2018-02-21 MED ORDER — VENLAFAXINE HCL 37.5 MG PO TABS: 112.5000 mg | ORAL_TABLET | ORAL | 1 refills | Status: DC

## 2018-02-21 NOTE — Patient Instructions (Addendum)
Increase venlafaxine XR to 3 of 37.5 mg capsules daily.  Use lorazepam (Ativan) 0.5mg  1 or 2 tablets every 8 hours as needed for severe anxiety or panic.  Start olanzapine 10 mg each evening for severe anxiety and depression.  This will be temporary. Pick up from the pharmacy.  Continue liothyronine 2 tablets each morning  Increase mirtazapine 45 mg 1 each evening for depression  Continue hydralazine 50 mg 3 times a day until you with either your primary care doctor about this medicine for blood pressure or the nephrologist.  Continue lisinopril 40 mg as prescribed.  Continue vitamin D as prescribed

## 2018-02-21 NOTE — Progress Notes (Signed)
Kelly Johnston [id number] 09-Aug-1970 47 y.o.  Subjective:   Patient ID:  Kelly Johnston is a 47 y.o. (DOB 09/24/1970) male.  Chief Complaint:  Chief Complaint  Patient presents with  . Follow-up    Medication management  . Anxiety  . Depression  . Medication Problem    Not sure meds are right    HPI Last seen 01/31/18.  Spoke with Hurshel Keys ex wife on phone with his permission. [name] presents to the office today for follow-up of recent psych hosp at [facility].  Admitted with hallucinations, delusions and memory problems and had accidentally stopped psych meds.  He had a mixture of symptoms that was diagnosed is perhaps delirium related to benzodiazepine withdrawal as well as depression with psychotic features having just had an ECT treatment.  He was hospitalized at [facility] from December 3 until December 5.  He is set to resume ECT at [facility] on an outpatient basis on Monday for 3 times a week treatment  He was confused about what meds to take he brought his medications with him and the discharge instructions.  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  We discussed this in detail.  He continues to have problems with severe depression and severe anxiety difficulty sleeping difficulty concentrating feelings of hopelessness, and feeling worthless.  Got confused and did not increase venlafaxine as recommended and also never took the olanzapine to his knowledge.  Called Hurshel Keys with his permission and she said he never took the olanzapine by mistake.  ECT next on Monday.  For M, W, F normally.  Christmas, Holiday schedule interfered.  Plans to start ECT Monday as planned: R unilateral.   He also needs counseling but the value of that might be limited during the active time of the ECT because there is expected to be some short-term memory impairment. Review  of Systems:  Review of Systems  Neurological: Positive for weakness. Negative for tremors.  Psychiatric/Behavioral: Positive for confusion, decreased concentration, dysphoric mood and sleep disturbance. Negative for agitation, behavioral problems, hallucinations, self-injury and suicidal ideas. The patient is nervous/anxious. The patient is not hyperactive.     Medications: I have reviewed the patient's current medications.  Current Outpatient Medications  Medication Sig Dispense Refill  . hydrALAZINE (APRESOLINE) 50 MG tablet Take 1 tablet (50 mg total) by mouth 3 (three) times daily. 90 tablet 1  . liothyronine (CYTOMEL) 25 MCG tablet Take 2 tablets (50 mcg total) by mouth daily. 90 tablet 0  . lisinopril (PRINIVIL,ZESTRIL) 40 MG tablet Take 40 mg by mouth daily.    Marland Kitchen LORazepam (ATIVAN) 0.5 MG tablet 1 or 2 as needed for panic attacks 30 tablet 0  . mirtazapine (REMERON) 45 MG tablet Take 1 tablet (45 mg total) by mouth at bedtime. (Patient taking differently: Take 22.5 mg by mouth at bedtime. ) 30 tablet 0  . venlafaxine (EFFEXOR) 37.5 MG tablet Take 75 mg by mouth every morning.     . Vitamin D, Ergocalciferol, (DRISDOL) 50000 units CAPS capsule Take 1 capsule (50,000 Units total) by mouth 2 (two) times a week. 8 capsule 2  . OLANZapine (ZYPREXA) 10 MG tablet Take 1 tablet (10 mg total) by mouth at bedtime. (Patient not taking: Reported on 02/21/2018) 30 tablet 1   No current facility-administered medications for this visit.  Medication Side Effects: None  Allergies:  Allergies  Allergen Reactions  . Latex Rash    Past Medical History:  Diagnosis Date  . Anxiety   . Benzodiazepine withdrawal (HCC)   . Chronic kidney disease    protein builds up  . Depression   . Heart murmur   . Hypertension   . IgA nephropathy   . Sleep apnea    cpap    Family History  Problem Relation Age of Onset  . Testicular cancer Father   . Alcohol abuse Cousin     Social History    Socioeconomic History  . Marital status: Divorced    Spouse name: Not on file  . Number of children: 3  . Years of education: Not on file  . Highest education level: Professional school degree (e.g., MD, DDS, DVM, JD)  Occupational History  . Not on file  Social Needs  . Financial resource strain: Not hard at all  . Food insecurity:    Worry: Never true    Inability: Never true  . Transportation needs:    Medical: No    Non-medical: No  Tobacco Use  . Smoking status: Never Smoker  . Smokeless tobacco: Never Used  Substance and Sexual Activity  . Alcohol use: No  . Drug use: Not Currently    Types: Marijuana    Comment: last used 3 to 4 years ago  . Sexual activity: Yes    Birth control/protection: Other-see comments  Lifestyle  . Physical activity:    Days per week: 0 days    Minutes per session: 0 min  . Stress: Very much  Relationships  . Social connections:    Talks on phone: Not on file    Gets together: Not on file    Attends religious service: Never    Active member of club or organization: No    Attends meetings of clubs or organizations: Never    Relationship status: Divorced  . Intimate partner violence:    Fear of current or ex partner: No    Emotionally abused: No    Physically abused: No    Forced sexual activity: No  Other Topics Concern  . Not on file  Social History Narrative  . Not on file    Past Medical History, Surgical history, Social history, and Family history were reviewed and updated as appropriate.   Please see review of systems for further details on the patient's review from today.   Objective:   Physical Exam:  There were no vitals taken for this visit.  Physical Exam Neurological:     Motor: No tremor.     Gait: Gait normal.  Psychiatric:        Attention and Perception: He is inattentive.        Mood and Affect: Mood is anxious and depressed. Affect is flat.        Speech: Speech is not rapid and pressured or slurred.         Behavior: Behavior is hyperactive.        Thought Content: Thought content is not paranoid or delusional. Thought content does not include homicidal or suicidal ideation.        Cognition and Memory: He exhibits impaired recent memory.     Comments: Insight and judgment fair. No cognitive deficits noted.  Aware of how to take meds.     Lab Review:     Component Value Date/Time   NA 142 01/09/2018 1513   K  3.9 01/09/2018 1513   CL 105 01/09/2018 1513   CO2 30 01/09/2018 1513   GLUCOSE 94 01/09/2018 1513   BUN 11 01/09/2018 1513   CREATININE 1.25 (H) 01/09/2018 1513   CREATININE 1.82 (H) 11/03/2013 1613   CALCIUM 8.9 01/09/2018 1513   PROT 6.9 05/30/2016 0409   ALBUMIN 3.2 (L) 05/30/2016 0409   AST 21 05/30/2016 0409   ALT 15 (L) 05/30/2016 0409   ALKPHOS 87 05/30/2016 0409   BILITOT 0.9 05/30/2016 0409   GFRNONAA >60 01/09/2018 1513   GFRNONAA 44 (L) 11/03/2013 1613   GFRAA >60 01/09/2018 1513   GFRAA 51 (L) 11/03/2013 1613       Component Value Date/Time   WBC 8.9 01/09/2018 1513   RBC 5.29 01/09/2018 1513   HGB 16.2 01/09/2018 1513   HCT 50.2 01/09/2018 1513   PLT 257 01/09/2018 1513   MCV 94.9 01/09/2018 1513   MCV 90.6 11/03/2013 1613   MCH 30.6 01/09/2018 1513   MCHC 32.3 01/09/2018 1513   RDW 12.0 01/09/2018 1513   LYMPHSABS 1.9 05/30/2016 0409   MONOABS 1.3 (H) 05/30/2016 0409   EOSABS 0.4 05/30/2016 0409   BASOSABS 0.0 05/30/2016 0409    No results found for: POCLITH, LITHIUM   No results found for: PHENYTOIN, PHENOBARB, VALPROATE, CBMZ   .res Assessment: Plan:    Severe episode of recurrent major depressive disorder, with psychotic features (HCC)  Generalized anxiety disorder  Low serum vitamin D   Strongly recommend against major decisions on career at this time. Disc this in detail  Recentlyt discharged from the hospital yesterday after a stay for delirium and major depression with psychotic features.  The delirium and psychosis have  resolved.  He is off a clorazepate 7.5 twice daily 5-day taper.  He was instructed not to take that on the day of the ECT.    Restart Cytomel 25 mcg BID  Increase the venlafaxine XR gradually to the previous dose of 150 mg daily.  Because he got confused and did not increase the venlafaxine he is to increase to 112.5 mg daily today.  Next visit we will increase to 150 mg a day.  We will achieve that at his next visit.  Other option disc with [facility] is nortriptyline.  After ECT restart lithium for prophylaxis.  Please see After Visit Summary for patient specific instructions.  Increase venlafaxine XR to 3 of 37.5 mg capsules daily.  Use lorazepam (Ativan) 0.5mg  1 or 2 tablets every 8 hours as needed for severe anxiety or panic.  Start olanzapine 10 mg each evening for severe anxiety and depression.  This will be temporary.  Continue liothyronine 2 tablets each morning  Increase mirtazapine 45 mg 1 each evening for depression  Continue hydralazine 50 mg 3 times a day until you with either your primary care doctor about this medicine for blood pressure or the nephrologist.  Continue lisinopril 40 mg as prescribed.  Continue vitamin D as prescribed  Call if there is a return of any confusion or paranoia or stop suicidal thoughts.  Discussed safety plan at length with patient.  Advised patient to contact office with any worsening signs and symptoms.  Instructed patient to go to the [facility] emergency room for evaluation if experiencing any acute safety concerns, to include suicidal intent. He agrees.  By the time he finishes ECT he should have started counseling.  40 MIN APPT  FU 2 weeks.  Meredith Staggers, MD, DFAPA   Future Appointments  Date Time Provider Department Center  02/24/2018 10:30 AM [facility] ECT [facility] None    No orders of the defined types were placed in this encounter.     -------------------------------

## 2018-02-24 ENCOUNTER — Encounter: Payer: Self-pay | Admitting: Anesthesiology

## 2018-02-24 ENCOUNTER — Encounter (HOSPITAL_BASED_OUTPATIENT_CLINIC_OR_DEPARTMENT_OTHER)
Admission: RE | Admit: 2018-02-24 | Discharge: 2018-02-24 | Disposition: A | Discharge status: Home / Self Care | Payer: BLUE CROSS/BLUE SHIELD | Source: Ambulatory Visit | Attending: Psychiatry | Admitting: Psychiatry

## 2018-02-24 DIAGNOSIS — R45851 Suicidal ideations: Secondary | ICD-10-CM | POA: Diagnosis not present

## 2018-02-24 DIAGNOSIS — Z91040 Latex allergy status: Secondary | ICD-10-CM | POA: Diagnosis not present

## 2018-02-24 DIAGNOSIS — F339 Major depressive disorder, recurrent, unspecified: Secondary | ICD-10-CM | POA: Diagnosis not present

## 2018-02-24 DIAGNOSIS — N189 Chronic kidney disease, unspecified: Secondary | ICD-10-CM | POA: Diagnosis not present

## 2018-02-24 DIAGNOSIS — G4730 Sleep apnea, unspecified: Secondary | ICD-10-CM | POA: Diagnosis not present

## 2018-02-24 DIAGNOSIS — F332 Major depressive disorder, recurrent severe without psychotic features: Secondary | ICD-10-CM | POA: Diagnosis not present

## 2018-02-24 DIAGNOSIS — I129 Hypertensive chronic kidney disease with stage 1 through stage 4 chronic kidney disease, or unspecified chronic kidney disease: Secondary | ICD-10-CM | POA: Diagnosis not present

## 2018-02-24 DIAGNOSIS — F418 Other specified anxiety disorders: Secondary | ICD-10-CM | POA: Diagnosis not present

## 2018-02-24 DIAGNOSIS — F419 Anxiety disorder, unspecified: Secondary | ICD-10-CM | POA: Diagnosis not present

## 2018-02-24 MED ORDER — GLYCOPYRROLATE 0.2 MG/ML IJ SOLN
INTRAMUSCULAR | Status: AC
  Administered 2018-02-24: 0.4 mg via INTRAVENOUS
  Filled 2018-02-24: qty 2

## 2018-02-24 MED ORDER — MIDAZOLAM HCL 2 MG/2ML IJ SOLN
INTRAMUSCULAR | Status: AC
  Filled 2018-02-24: qty 2

## 2018-02-24 MED ORDER — SODIUM CHLORIDE 0.9 % IV SOLN
INTRAVENOUS | Status: DC | PRN
  Administered 2018-02-24: 10:00:00 via INTRAVENOUS

## 2018-02-24 MED ORDER — LABETALOL HCL 5 MG/ML IV SOLN
INTRAVENOUS | Status: DC | PRN
  Administered 2018-02-24: 30 mg via INTRAVENOUS

## 2018-02-24 MED ORDER — SUCCINYLCHOLINE CHLORIDE 200 MG/10ML IV SOSY
PREFILLED_SYRINGE | INTRAVENOUS | Status: DC | PRN
  Administered 2018-02-24: 250 mg via INTRAVENOUS

## 2018-02-24 MED ORDER — LABETALOL HCL 5 MG/ML IV SOLN
INTRAVENOUS | Status: AC
  Filled 2018-02-24: qty 4

## 2018-02-24 MED ORDER — GLYCOPYRROLATE 0.2 MG/ML IJ SOLN
0.4000 mg | Freq: Once | INTRAMUSCULAR | Status: AC
  Administered 2018-02-24: 0.4 mg via INTRAVENOUS

## 2018-02-24 MED ORDER — METHOHEXITAL SODIUM 100 MG/10ML IV SOSY
PREFILLED_SYRINGE | INTRAVENOUS | Status: DC | PRN
  Administered 2018-02-24: 190 mg via INTRAVENOUS

## 2018-02-24 MED ORDER — SODIUM CHLORIDE 0.9 % IV SOLN
500.0000 mL | Freq: Once | INTRAVENOUS | Status: AC
  Administered 2018-02-24: 500 mL via INTRAVENOUS

## 2018-02-24 MED ORDER — SUCCINYLCHOLINE CHLORIDE 20 MG/ML IJ SOLN
INTRAMUSCULAR | Status: AC
  Filled 2018-02-24: qty 1

## 2018-02-24 MED ORDER — MIDAZOLAM HCL 2 MG/2ML IJ SOLN
2.0000 mg | Freq: Once | INTRAMUSCULAR | Status: AC
  Administered 2018-02-24: 2 mg via INTRAVENOUS

## 2018-02-24 NOTE — Anesthesia Preprocedure Evaluation (Signed)
Anesthesia Evaluation  Patient identified by MRN, date of birth, ID band Patient awake    Reviewed: Allergy & Precautions, NPO status , Patient's Chart, lab work & pertinent test results, reviewed documented beta blocker date and time   History of Anesthesia Complications Negative for: history of anesthetic complications  Airway Mallampati: III  TM Distance: >3 FB     Dental  (+) Chipped   Pulmonary sleep apnea ,           Cardiovascular hypertension, Pt. on medications + Valvular Problems/Murmurs      Neuro/Psych PSYCHIATRIC DISORDERS Anxiety Depression    GI/Hepatic   Endo/Other  Hypothyroidism   Renal/GU Renal disease     Musculoskeletal   Abdominal   Peds  Hematology   Anesthesia Other Findings Past Medical History: No date: Anxiety No date: Benzodiazepine withdrawal (HCC) No date: Chronic kidney disease     Comment:  protein builds up No date: Depression No date: Heart murmur No date: Hypertension No date: IgA nephropathy No date: Sleep apnea     Comment:  cpap   Reproductive/Obstetrics                             Anesthesia Physical  Anesthesia Plan  ASA: III  Anesthesia Plan: General   Post-op Pain Management:    Induction: Intravenous  PONV Risk Score and Plan:   Airway Management Planned:   Additional Equipment:   Intra-op Plan:   Post-operative Plan:   Informed Consent: I have reviewed the patients History and Physical, chart, labs and discussed the procedure including the risks, benefits and alternatives for the proposed anesthesia with the patient or authorized representative who has indicated his/her understanding and acceptance.     Plan Discussed with: CRNA  Anesthesia Plan Comments:         Anesthesia Quick Evaluation

## 2018-02-24 NOTE — Anesthesia Procedure Notes (Signed)
Procedure Name: LMA Insertion Date/Time: 02/24/2018 10:46 AM Performed by: Lily KocherPeralta, Kodee Drury, CRNA Pre-anesthesia Checklist: Patient identified, Patient being monitored, Timeout performed, Emergency Drugs available and Suction available Patient Re-evaluated:Patient Re-evaluated prior to induction Oxygen Delivery Method: Circle system utilized Preoxygenation: Pre-oxygenation with 100% oxygen Induction Type: IV induction Ventilation: Mask ventilation without difficulty LMA: LMA inserted LMA Size: 4.0 Tube type: Oral Number of attempts: 1 Placement Confirmation: positive ETCO2 and breath sounds checked- equal and bilateral Tube secured with: Tape Dental Injury: Teeth and Oropharynx as per pre-operative assessment

## 2018-02-24 NOTE — Anesthesia Procedure Notes (Signed)
Date/Time: 02/24/2018 10:47 AM Performed by: Lily KocherPeralta, Daphney Hopke, CRNA Pre-anesthesia Checklist: Patient identified, Emergency Drugs available, Suction available and Patient being monitored Patient Re-evaluated:Patient Re-evaluated prior to induction Oxygen Delivery Method: Circle system utilized Preoxygenation: Pre-oxygenation with 100% oxygen Induction Type: IV induction Ventilation: Mask ventilation without difficulty and Mask ventilation throughout procedure Airway Equipment and Method: Bite block Placement Confirmation: positive ETCO2 Dental Injury: Teeth and Oropharynx as per pre-operative assessment

## 2018-02-24 NOTE — Procedures (Signed)
ECT SERVICES Physician's Interval Evaluation & Treatment Note  Patient Identification: Cydney Okhomas E Fluellen MRN:  [id number] Date of Evaluation:  02/24/2018 TX #: 9  MADRS:   MMSE:   P.E. Findings:  No change to physical exam  Psychiatric Interval Note:  Mood and affect seem a little better  Subjective:  Patient is a 47 y.o. male seen for evaluation for Electroconvulsive Therapy. Still some anxiety  Treatment Summary:   [x]   Right Unilateral             []  Bilateral   % Energy : 0.3 ms 100%   Impedance: 1330 ohms  Seizure Energy Index: 14,770 V squared  Postictal Suppression Index: 85%  Seizure Concordance Index: 98%  Medications  Pre Shock: Robinul 0.4 mg labetalol 20 mg Brevital 190 mg succinylcholine 250 mg  Post Shock: Versed 2 mg  Seizure Duration: 19 seconds by EMG 32 seconds by EEG   Comments: Follow-up Friday  Lungs:  [x]   Clear to auscultation               []  Other:   Heart:    [x]   Regular rhythm             []  irregular rhythm    [x]   Previous H&P reviewed, patient examined and there are NO CHANGES                 []   Previous H&P reviewed, patient examined and there are changes noted.   Mordecai RasmussenJohn Kingson Lohmeyer, MD 12/30/201910:37 AM

## 2018-02-24 NOTE — Transfer of Care (Signed)
Immediate Anesthesia Transfer of Care Note  Patient: Kelly Johnston  Procedure(s) Performed: ECT TX  Patient Location: PACU  Anesthesia Type:General  Level of Consciousness: sedated  Airway & Oxygen Therapy: Patient Spontanous Breathing and Patient connected to face mask oxygen  Post-op Assessment: Report given to RN and Post -op Vital signs reviewed and stable  Post vital signs: Reviewed and stable  Last Vitals:  Vitals Value Taken Time  BP 147/99 02/24/2018 10:58 AM  Temp    Pulse 103 02/24/2018 11:00 AM  Resp 20 02/24/2018 11:00 AM  SpO2 99 % 02/24/2018 11:00 AM  Vitals shown include unvalidated device data.  Last Pain:  Vitals:   02/24/18 0839  TempSrc:   PainSc: 0-No pain         Complications: No apparent anesthesia complications

## 2018-02-24 NOTE — Discharge Instructions (Signed)
1)  The drugs that you have been given will stay in your system until tomorrow so for the       next 24 hours you should not:  A. Drive an automobile  B. Make any legal decisions  C. Drink any alcoholic beverages  2)  You may resume your regular meals upon return home.  3)  A responsible adult must take you home.  Someone should stay with you for a few          hours, then be available by phone for the remainder of the treatment day.  4)  You May experience any of the following symptoms:  Headache, Nausea and a dry mouth (due to the medications you were given),  temporary memory loss and some confusion, or sore muscles (a warm bath  should help this).  If you you experience any of these symptoms let us know on                your return visit.  5)  Report any of the following: any acute discomfort, severe headache, or temperature        greater than 100.5 F.   Also report any unusual redness, swelling, drainage, or pain         at your IV site.    You may report Symptoms to:  ECT PROGRAM- [place] [facility]          Phone: [phone], ECT Department           or Dr. Shary Keylapac's office [phone]  6)  Your next ECT Treatment is Day Friday  Date February 28, 2018  We will call 2 days prior to your scheduled appointment for arrival times.  7)  Nothing to eat or drink after midnight the night before your procedure.  8)  Take lisinopril     With a sip of water the morning of your procedure.  9)  Other Instructions: Call [phone] to cancel the morning of your procedure due         to illness or emergency.  10) We will call within 72 hours to assess how you are feeling.

## 2018-02-24 NOTE — H&P (Signed)
Kelly Johnston is an 47 y.o. male.   Chief Complaint: Patient feels he is doing better.  Still some anxiety on days that are up and down but overall does not seem as hopeless HPI: History of recurrent severe depression  Past Medical History:  Diagnosis Date  . Anxiety   . Benzodiazepine withdrawal (HCC)   . Chronic kidney disease    protein builds up  . Depression   . Heart murmur   . Hypertension   . IgA nephropathy   . Sleep apnea    cpap    Past Surgical History:  Procedure Laterality Date  . chest surgery    . INCISION AND DRAINAGE ABSCESS  01/16/2012   Procedure: INCISION AND DRAINAGE ABSCESS;  Surgeon: Wilmon ArmsMatthew K. Corliss Skainssuei, MD;  Location: [facility];  Service: General;  Laterality: Left;  . PECTUS EXCAVATUM REPAIR      Family History  Problem Relation Age of Onset  . Testicular cancer Father   . Alcohol abuse Cousin    Social History:  reports that he has never smoked. He has never used smokeless tobacco. He reports previous drug use. Drug: Marijuana. He reports that he does not drink alcohol.  Allergies:  Allergies  Allergen Reactions  . Latex Rash    (Not in a hospital admission)   No results found for this or any previous visit (from the past 48 hour(s)). No results found.  Review of Systems  Constitutional: Negative.   HENT: Negative.   Eyes: Negative.   Respiratory: Negative.   Cardiovascular: Negative.   Gastrointestinal: Negative.   Musculoskeletal: Negative.   Skin: Negative.   Neurological: Negative.   Psychiatric/Behavioral: Negative for depression, hallucinations, memory loss, substance abuse and suicidal ideas. The patient is not nervous/anxious and does not have insomnia.     Blood pressure (!) 140/93, pulse (!) 115, temperature 98.1 F (36.7 C), temperature source Oral, SpO2 98 %. Physical Exam  Nursing note and vitals reviewed. Constitutional: He appears well-developed and well-nourished.  HENT:  Head: Normocephalic and atraumatic.  Eyes:  Pupils are equal, round, and reactive to light. Conjunctivae are normal.  Neck: Normal range of motion.  Cardiovascular: Regular rhythm and normal heart sounds.  Respiratory: Effort normal.  GI: Soft.  Musculoskeletal: Normal range of motion.  Neurological: He is alert.  Skin: Skin is warm and dry.  Psychiatric: He has a normal mood and affect. His behavior is normal. Judgment and thought content normal.     Assessment/Plan We are going to try and continue an index course although that we will meet our next treatment is Friday  Kelly RasmussenJohn Clapacs, MD 02/24/2018, 10:36 AM

## 2018-02-24 NOTE — Anesthesia Post-op Follow-up Note (Signed)
Anesthesia QCDR form completed.        

## 2018-02-24 NOTE — Anesthesia Postprocedure Evaluation (Signed)
Anesthesia Post Note  Patient: Kelly Johnston  Procedure(s) Performed: ECT TX  Patient location during evaluation: PACU Anesthesia Type: General Level of consciousness: awake and alert Pain management: pain level controlled Vital Signs Assessment: post-procedure vital signs reviewed and stable Respiratory status: spontaneous breathing, nonlabored ventilation, respiratory function stable and patient connected to nasal cannula oxygen Cardiovascular status: blood pressure returned to baseline and stable Postop Assessment: no apparent nausea or vomiting Anesthetic complications: no     Last Vitals:  Vitals:   02/24/18 1128 02/24/18 1136  BP:  (!) 169/97  Pulse: 98 88  Resp: 19 16  Temp: 37.2 C   SpO2: 97%     Last Pain:  Vitals:   02/24/18 1136  TempSrc:   PainSc: 0-No pain                 Lenard SimmerAndrew Lakaya Tolen

## 2018-02-27 ENCOUNTER — Other Ambulatory Visit: Payer: Self-pay | Admitting: Psychiatry

## 2018-02-28 ENCOUNTER — Encounter: Payer: Self-pay | Admitting: Anesthesiology

## 2018-02-28 ENCOUNTER — Encounter
Admission: RE | Admit: 2018-02-28 | Discharge: 2018-02-28 | Disposition: A | Discharge status: Home / Self Care | Payer: BLUE CROSS/BLUE SHIELD | Source: Ambulatory Visit | Attending: Psychiatry | Admitting: Psychiatry

## 2018-02-28 DIAGNOSIS — I129 Hypertensive chronic kidney disease with stage 1 through stage 4 chronic kidney disease, or unspecified chronic kidney disease: Secondary | ICD-10-CM | POA: Diagnosis not present

## 2018-02-28 DIAGNOSIS — F329 Major depressive disorder, single episode, unspecified: Secondary | ICD-10-CM | POA: Diagnosis not present

## 2018-02-28 DIAGNOSIS — F332 Major depressive disorder, recurrent severe without psychotic features: Secondary | ICD-10-CM

## 2018-02-28 DIAGNOSIS — N189 Chronic kidney disease, unspecified: Secondary | ICD-10-CM | POA: Insufficient documentation

## 2018-02-28 DIAGNOSIS — Z91040 Latex allergy status: Secondary | ICD-10-CM | POA: Diagnosis not present

## 2018-02-28 DIAGNOSIS — G4730 Sleep apnea, unspecified: Secondary | ICD-10-CM | POA: Diagnosis not present

## 2018-02-28 DIAGNOSIS — F419 Anxiety disorder, unspecified: Secondary | ICD-10-CM | POA: Insufficient documentation

## 2018-02-28 MED ORDER — METHOHEXITAL SODIUM 0.5 G IJ SOLR
INTRAMUSCULAR | Status: AC
  Filled 2018-02-28: qty 500

## 2018-02-28 MED ORDER — MIDAZOLAM HCL 2 MG/2ML IJ SOLN
INTRAMUSCULAR | Status: AC
  Filled 2018-02-28: qty 2

## 2018-02-28 MED ORDER — GLYCOPYRROLATE 0.2 MG/ML IJ SOLN
0.4000 mg | Freq: Once | INTRAMUSCULAR | Status: AC
  Administered 2018-02-28: 0.4 mg via INTRAVENOUS

## 2018-02-28 MED ORDER — SUCCINYLCHOLINE CHLORIDE 20 MG/ML IJ SOLN
INTRAMUSCULAR | Status: DC | PRN
  Administered 2018-02-28: 250 mg via INTRAVENOUS

## 2018-02-28 MED ORDER — SODIUM CHLORIDE 0.9 % IV SOLN
INTRAVENOUS | Status: DC | PRN
  Administered 2018-02-28: 10:00:00 via INTRAVENOUS

## 2018-02-28 MED ORDER — METHOHEXITAL SODIUM 100 MG/10ML IV SOSY
PREFILLED_SYRINGE | INTRAVENOUS | Status: DC | PRN
  Administered 2018-02-28: 190 mg via INTRAVENOUS

## 2018-02-28 MED ORDER — LABETALOL HCL 5 MG/ML IV SOLN
INTRAVENOUS | Status: AC
  Filled 2018-02-28: qty 4

## 2018-02-28 MED ORDER — GLYCOPYRROLATE 0.2 MG/ML IJ SOLN
INTRAMUSCULAR | Status: AC
  Administered 2018-02-28: 0.4 mg via INTRAVENOUS
  Filled 2018-02-28: qty 2

## 2018-02-28 MED ORDER — SODIUM CHLORIDE 0.9 % IV SOLN
500.0000 mL | Freq: Once | INTRAVENOUS | Status: AC
  Administered 2018-02-28: 500 mL via INTRAVENOUS

## 2018-02-28 MED ORDER — MIDAZOLAM HCL 2 MG/2ML IJ SOLN
2.0000 mg | Freq: Once | INTRAMUSCULAR | Status: AC
  Administered 2018-02-28: 2 mg via INTRAVENOUS

## 2018-02-28 MED ORDER — LABETALOL HCL 5 MG/ML IV SOLN
INTRAVENOUS | Status: DC | PRN
  Administered 2018-02-28: 30 mg via INTRAVENOUS

## 2018-02-28 NOTE — Anesthesia Procedure Notes (Signed)
Procedure Name: LMA Insertion Performed by: Clayburn Weekly, Anne NgAndrew L, CRNA Pre-anesthesia Checklist: Patient identified, Emergency Drugs available, Suction available and Patient being monitored Patient Re-evaluated:Patient Re-evaluated prior to induction Oxygen Delivery Method: Circle system utilized Preoxygenation: Pre-oxygenation with 100% oxygen Induction Type: IV induction Ventilation: Two handed mask ventilation required LMA: LMA inserted LMA Size: 4.0 Number of attempts: 1 Placement Confirmation: ETT inserted through vocal cords under direct vision,  positive ETCO2 and breath sounds checked- equal and bilateral Tube secured with: Tape Dental Injury: Teeth and Oropharynx as per pre-operative assessment

## 2018-02-28 NOTE — Anesthesia Preprocedure Evaluation (Signed)
Anesthesia Evaluation  Patient identified by MRN, date of birth, ID band Patient awake    Reviewed: Allergy & Precautions, NPO status , Patient's Chart, lab work & pertinent test results  History of Anesthesia Complications Negative for: history of anesthetic complications  Airway Mallampati: III  TM Distance: >3 FB Neck ROM: Full    Dental  (+) Poor Dentition   Pulmonary sleep apnea and Continuous Positive Airway Pressure Ventilation , neg COPD,    breath sounds clear to auscultation- rhonchi (-) wheezing      Cardiovascular Exercise Tolerance: Good hypertension, Pt. on medications (-) CAD, (-) Past MI, (-) Cardiac Stents and (-) CABG  Rhythm:Regular Rate:Normal - Systolic murmurs and - Diastolic murmurs    Neuro/Psych neg Seizures PSYCHIATRIC DISORDERS Anxiety Depression negative neurological ROS     GI/Hepatic negative GI ROS, Neg liver ROS,   Endo/Other  neg diabetesHypothyroidism   Renal/GU Renal disease     Musculoskeletal negative musculoskeletal ROS (+)   Abdominal (+) + obese,   Peds  Hematology negative hematology ROS (+)   Anesthesia Other Findings Past Medical History: No date: Anxiety No date: Benzodiazepine withdrawal (HCC) No date: Chronic kidney disease     Comment:  protein builds up No date: Depression No date: Heart murmur No date: Hypertension No date: IgA nephropathy No date: Sleep apnea     Comment:  cpap   Reproductive/Obstetrics                             Anesthesia Physical  Anesthesia Plan  ASA: II  Anesthesia Plan: General   Post-op Pain Management:    Induction: Intravenous  PONV Risk Score and Plan: 1 and Midazolam  Airway Management Planned: Mask  Additional Equipment:   Intra-op Plan:   Post-operative Plan:   Informed Consent: I have reviewed the patients History and Physical, chart, labs and discussed the procedure including the  risks, benefits and alternatives for the proposed anesthesia with the patient or authorized representative who has indicated his/her understanding and acceptance.   Dental advisory given  Plan Discussed with: CRNA and Anesthesiologist  Anesthesia Plan Comments:         Anesthesia Quick Evaluation

## 2018-02-28 NOTE — Procedures (Signed)
ECT SERVICES Physician's Interval Evaluation & Treatment Note  Patient Identification: Kelly Johnston MRN:  [id number] Date of Evaluation:  02/28/2018 TX #: 9  MADRS: 14  MMSE: 30  P.E. Findings:  No change to physical exam  Psychiatric Interval Note:  Mood is feeling much better no suicidal thoughts more optimistic  Subjective:  Patient is a 48 y.o. male seen for evaluation for Electroconvulsive Therapy. Feeling good not noticing any side effects  Treatment Summary:   [x]   Right Unilateral             []  Bilateral   % Energy : 0.3 ms 100%   Impedance: 1030 ohms  Seizure Energy Index: 9494 V squared  Postictal Suppression Index: 89%  Seizure Concordance Index: 99%  Medications  Pre Shock: Robinul 0.4 mg labetalol 30 mg Brevital 190 mg succinylcholine 250 mg  Post Shock: Versed 2 mg  Seizure Duration: 23 seconds by foot movement 33 seconds by EEG   Comments: Follow-up 1 week if he is agreeable as he is now at the end of his index course  Lungs:  [x]   Clear to auscultation               []  Other:   Heart:    [x]   Regular rhythm             []  irregular rhythm    [x]   Previous H&P reviewed, patient examined and there are NO CHANGES                 []   Previous H&P reviewed, patient examined and there are changes noted.   [name], MD 1/3/202010:28 AM

## 2018-02-28 NOTE — Anesthesia Postprocedure Evaluation (Signed)
Anesthesia Post Note  Patient: Kelly Johnston  Procedure(s) Performed: ECT TX  Patient location during evaluation: PACU Anesthesia Type: General Level of consciousness: awake and alert Pain management: pain level controlled Vital Signs Assessment: post-procedure vital signs reviewed and stable Respiratory status: spontaneous breathing, nonlabored ventilation and respiratory function stable Cardiovascular status: blood pressure returned to baseline and stable Postop Assessment: no signs of nausea or vomiting Anesthetic complications: no     Last Vitals:  Vitals:   02/28/18 1118 02/28/18 1121  BP: (!) 168/102   Pulse: 96   Resp: (!) 22   Temp: 37.1 C (P) 36.8 C  SpO2: 96%     Last Pain:  Vitals:   02/28/18 1121  TempSrc: (P) Oral  PainSc:                  Maston Wight

## 2018-02-28 NOTE — Transfer of Care (Signed)
Immediate Anesthesia Transfer of Care Note  Patient: Kelly Johnston  Procedure(s) Performed: ECT TX  Patient Location: PACU  Anesthesia Type:General  Level of Consciousness: awake, alert  and oriented  Airway & Oxygen Therapy: Patient Spontanous Breathing and Patient connected to face mask oxygen  Post-op Assessment: Report given to RN and Post -op Vital signs reviewed and stable  Post vital signs: Reviewed  Last Vitals:  Vitals Value Taken Time  BP    Temp    Pulse    Resp    SpO2      Last Pain:  Vitals:   02/28/18 0819  TempSrc:   PainSc: 0-No pain         Complications: No apparent anesthesia complications

## 2018-02-28 NOTE — H&P (Signed)
Kelly Johnston is an 48 y.o. male.   Chief Complaint: No specific new complaints.  Feels that mood is significantly improved and plateaued HPI: History of recurrent severe depression  Past Medical History:  Diagnosis Date  . Anxiety   . Benzodiazepine withdrawal (HCC)   . Chronic kidney disease    protein builds up  . Depression   . Heart murmur   . Hypertension   . IgA nephropathy   . Sleep apnea    cpap    Past Surgical History:  Procedure Laterality Date  . chest surgery    . INCISION AND DRAINAGE ABSCESS  01/16/2012   Procedure: INCISION AND DRAINAGE ABSCESS;  Surgeon: Wilmon Arms. Kelly Skains, MD;  Location: [facility];  Service: General;  Laterality: Left;  . PECTUS EXCAVATUM REPAIR      Family History  Problem Relation Age of Onset  . Testicular cancer Father   . Alcohol abuse Cousin    Social History:  reports that he has never smoked. He has never used smokeless tobacco. He reports previous drug use. Drug: Marijuana. He reports that he does not drink alcohol.  Allergies:  Allergies  Allergen Reactions  . Latex Rash    (Not in a hospital admission)   No results found for this or any previous visit (from the past 48 hour(s)). No results found.  Review of Systems  Constitutional: Negative.   HENT: Negative.   Eyes: Negative.   Respiratory: Negative.   Cardiovascular: Negative.   Gastrointestinal: Negative.   Musculoskeletal: Negative.   Skin: Negative.   Neurological: Negative.   Psychiatric/Behavioral: Negative.     Blood pressure (!) 170/94, temperature 97.9 F (36.6 C), temperature source Oral, resp. rate 18, height 6\' 5"  (1.956 m), weight (!) 137.4 kg, SpO2 99 %. Physical Exam  Nursing note and vitals reviewed. Constitutional: He appears well-developed and well-nourished.  HENT:  Head: Normocephalic and atraumatic.  Eyes: Pupils are equal, round, and reactive to light. Conjunctivae are normal.  Neck: Normal range of motion.  Cardiovascular: Regular  rhythm and normal heart sounds.  Respiratory: Effort normal. No respiratory distress.  GI: Soft.  Musculoskeletal: Normal range of motion.  Neurological: He is alert.  Skin: Skin is warm and dry.  Psychiatric: He has a normal mood and affect. His behavior is normal. Judgment and thought content normal.     Assessment/Plan Patient appears to have had good benefit from ECT and plateaued.  We will stop attempts at doing an index course and see him in 1 week for maintenance follow-up  [name], MD 02/28/2018, 9:57 AM

## 2018-02-28 NOTE — Anesthesia Post-op Follow-up Note (Signed)
Anesthesia QCDR form completed.        

## 2018-02-28 NOTE — Discharge Instructions (Signed)
1)  The drugs that you have been given will stay in your system until tomorrow so for the       next 24 hours you should not:  A. Drive an automobile  B. Make any legal decisions  C. Drink any alcoholic beverages  2)  You may resume your regular meals upon return home.  3)  A responsible adult must take you home.  Someone should stay with you for a few          hours, then be available by phone for the remainder of the treatment day.  4)  You May experience any of the following symptoms:  Headache, Nausea and a dry mouth (due to the medications you were given),  temporary memory loss and some confusion, or sore muscles (a warm bath  should help this).  If you you experience any of these symptoms let us know on                your return visit.  5)  Report any of the following: any acute discomfort, severe headache, or temperature        greater than 100.5 F.   Also report any unusual redness, swelling, drainage, or pain         at your IV site.    You may report Symptoms to:  ECT PROGRAM- [place] [facility]          Phone: [phone], ECT Department           or Dr. Shary Key office [phone]  6)  Your next ECT Treatment is Friday January 10   We will call 2 days prior to your scheduled appointment for arrival times.  7)  Nothing to eat or drink after midnight the night before your procedure.  8)  Take LISINOPRIL    With a sip of water the morning of your procedure.  9)  Other Instructions: Call [phone] to cancel the morning of your procedure due         to illness or emergency.  10) We will call within 72 hours to assess how you are feeling.

## 2018-03-03 ENCOUNTER — Telehealth: Payer: Self-pay

## 2018-03-04 ENCOUNTER — Telehealth (HOSPITAL_COMMUNITY): Payer: Self-pay | Admitting: *Deleted

## 2018-03-04 NOTE — Telephone Encounter (Signed)
Called for prior authorization of ECT. Was told to fax clinicals to [phone] with ref #[id number] to clinical nurse. Information faxed, waiting on decision.

## 2018-03-06 ENCOUNTER — Encounter: Payer: Self-pay | Admitting: Psychiatry

## 2018-03-06 ENCOUNTER — Ambulatory Visit: Payer: BLUE CROSS/BLUE SHIELD | Admitting: Psychiatry

## 2018-03-06 VITALS — BP 134/89 | HR 119

## 2018-03-06 DIAGNOSIS — F411 Generalized anxiety disorder: Secondary | ICD-10-CM

## 2018-03-06 DIAGNOSIS — R7989 Other specified abnormal findings of blood chemistry: Secondary | ICD-10-CM | POA: Diagnosis not present

## 2018-03-06 DIAGNOSIS — F333 Major depressive disorder, recurrent, severe with psychotic symptoms: Secondary | ICD-10-CM

## 2018-03-06 MED ORDER — VENLAFAXINE HCL ER 150 MG PO CP24: 150.0000 mg | ORAL_CAPSULE | Freq: Every day | ORAL | 0 refills | Status: DC

## 2018-03-06 NOTE — Patient Instructions (Signed)
Increase Venlafaxine XR Effexor to 150mg  daily or 4 of 37.5 capsules

## 2018-03-06 NOTE — Progress Notes (Signed)
Kelly Johnston [id number] [birth year] 48 y.o.  Subjective:   Patient ID:  Kelly Johnston is a 48 y.o. (DOB [birth year]) male.  Chief Complaint:  Chief Complaint  Patient presents with  . Follow-up  Follow-up of severe depression with psychosis, altered mental status and anxiety requiring ECT Last seen 02/21/2018 Kelly Johnston presents today for follow-up of the above.  At the last visit multiple changes were made including increasing Effexor XR to 150 mg daily, adding olanzapine 10 mg nightly for treatment resistant depression with some psychotic features and severe anxiety, and increasing Cytomel for treatment resistant depression.  He also completed ECT treatments last week for the same.  He feels dramatically better than he did at last visit.  The depression has resolved.  The anxiety is much more appropriate.  His thinking is clearer.  He has decided that he can no longer be a trial all your and feels at peace with that decision.  He is exploring other options.  He feels capable of thinking about those other options.  He is no longer ruminating.  He is sleeping well.  His appetite is good.  He has no suicidal thoughts.  He had [facility].  Admitted with hallucinations, delusions and memory problems and had accidentally stopped psych meds.  He had a mixture of symptoms that was diagnosed is perhaps delirium related to benzodiazepine withdrawal as well as depression with psychotic features having just had an ECT treatment.  [facility] was [facility] from December 3 until December 5.  He is set to resume ECT at [facility] on an outpatient basis on Monday for 3 times a week treatment.  He is completed ECT treatment  Review of Systems:   No tremor.  No confusion.  No headaches.  No balance problems.  No sedation.  Medications: I have reviewed the patient's current medications.  Current Outpatient Medications  Medication Sig Dispense Refill  . hydrALAZINE  (APRESOLINE) 50 MG tablet Take 1 tablet (50 mg total) by mouth 3 (three) times daily. 90 tablet 1  . liothyronine (CYTOMEL) 25 MCG tablet Take 2 tablets (50 mcg total) by mouth daily. 60 tablet 1  . lisinopril (PRINIVIL,ZESTRIL) 40 MG tablet Take 1 tablet (40 mg total) by mouth daily. 30 tablet 1  . LORazepam (ATIVAN) 0.5 MG tablet 1 or 2 as needed for severe anxiety every 8 hours 60 tablet 1  . mirtazapine (REMERON) 45 MG tablet Take 1 tablet (45 mg total) by mouth at bedtime. 30 tablet 1  . OLANZapine (ZYPREXA) 10 MG tablet Take 1 tablet (10 mg total) by mouth at bedtime. 30 tablet 1  . Vitamin D, Ergocalciferol, (DRISDOL) 1.25 MG (50000 UT) CAPS capsule Take 1 capsule (50,000 Units total) by mouth 2 (two) times a week. 8 capsule 2  . venlafaxine XR (EFFEXOR-XR) 150 MG 24 hr capsule Take 1 capsule (150 mg total) by mouth daily with breakfast. 90 capsule 0   No current facility-administered medications for this visit.     Medication Side Effects: None  Allergies:  Allergies  Allergen Reactions  . Latex Rash    Past Medical History:  Diagnosis Date  . Anxiety   . Benzodiazepine withdrawal (HCC)   . Chronic kidney disease    protein builds up  . Depression   . Heart murmur   . Hypertension   . IgA nephropathy   . Sleep apnea    cpap    Family History  Problem Relation Age of Onset  . Testicular cancer Father   . Alcohol abuse Cousin     Social History   Socioeconomic History  . Marital status: Divorced    Spouse name: Not on file  . Number of children: 3  . Years of education: Not on file  . Highest education level: Professional school degree (e.g., MD, DDS, DVM, JD)  Occupational History  . Not on file  Social Needs  . Financial resource strain: Not hard at all  . Food insecurity:    Worry: Never true    Inability: Never true  . Transportation needs:    Medical: No    Non-medical: No  Tobacco Use  . Smoking status: Never Smoker  . Smokeless tobacco: Never  Used  Substance and Sexual Activity  . Alcohol use: No  . Drug use: Not Currently    Types: Marijuana    Comment: last used 3 to 4 years ago  . Sexual activity: Yes    Birth control/protection: Other-see comments  Lifestyle  . Physical activity:    Days per week: 0 days    Minutes per session: 0 min  . Stress: Very much  Relationships  . Social connections:    Talks on phone: Not on file    Gets together: Not on file    Attends religious service: Never    Active member of club or organization: No    Attends meetings of clubs or organizations: Never    Relationship status: Divorced  . Intimate partner violence:    Fear of current or ex partner: No    Emotionally abused: No    Physically abused: No    Forced sexual activity: No  Other Topics Concern  . Not on file  Social History Narrative  . Not on file    Past Medical History, Surgical history, Social history, and Family history were reviewed and updated as appropriate.   Please see review of systems for further details on the patient's review from today.   Objective:   Physical Exam:  BP 134/89   Pulse (!) 119   Physical Exam Neurological:     Motor: No tremor.     Gait: Gait normal.  Psychiatric:        Attention and Perception: He is attentive.        Mood and Affect: Mood is anxious. Mood is not depressed. Affect is not flat.        Speech: Speech is not rapid and pressured or slurred.        Behavior: Behavior is not hyperactive.        Thought Content: Thought content is not paranoid or delusional. Thought content does not include homicidal or suicidal ideation.        Cognition and Memory: He exhibits impaired recent memory.     Comments: Insight and judgment fair. No cognitive deficits noted.  Aware of how to take meds.     Lab Review:     Component Value Date/Time   NA 142 01/09/2018 1513   K 3.9 01/09/2018 1513   CL 105 01/09/2018 1513   CO2 30 01/09/2018 1513   GLUCOSE 94 01/09/2018 1513    BUN 11 01/09/2018 1513   CREATININE 1.25 (H) 01/09/2018 1513   CREATININE 1.82 (H) 11/03/2013 1613   CALCIUM 8.9 01/09/2018 1513   PROT 6.9 05/30/2016 0409   ALBUMIN 3.2 (L) 05/30/2016 0409   AST 21 05/30/2016 0409   ALT 15 (L) 05/30/2016 0409  ALKPHOS 87 05/30/2016 0409   BILITOT 0.9 05/30/2016 0409   GFRNONAA >60 01/09/2018 1513   GFRNONAA 44 (L) 11/03/2013 1613   GFRAA >60 01/09/2018 1513   GFRAA 51 (L) 11/03/2013 1613       Component Value Date/Time   WBC 8.9 01/09/2018 1513   RBC 5.29 01/09/2018 1513   HGB 16.2 01/09/2018 1513   HCT 50.2 01/09/2018 1513   PLT 257 01/09/2018 1513   MCV 94.9 01/09/2018 1513   MCV 90.6 11/03/2013 1613   MCH 30.6 01/09/2018 1513   MCHC 32.3 01/09/2018 1513   RDW 12.0 01/09/2018 1513   LYMPHSABS 1.9 05/30/2016 0409   MONOABS 1.3 (H) 05/30/2016 0409   EOSABS 0.4 05/30/2016 0409   BASOSABS 0.0 05/30/2016 0409    No results found for: POCLITH, LITHIUM   No results found for: PHENYTOIN, PHENOBARB, VALPROATE, CBMZ   .res Assessment: Plan:    Severe episode of recurrent major depressive disorder, with psychotic features (HCC) - Plan: venlafaxine XR (EFFEXOR-XR) 150 MG 24 hr capsule  Generalized anxiety disorder  Low serum vitamin D   Mr. Seim has had a severe episode of depression with psychotic features and anxiety and cognitive impairment which has responded to ECT plus medication changes.  He is completed ECT.  He is markedly better than he was 2 weeks ago.  He states the depression has resolved.  He is thinking is clearer and he is no longer ruminating.  He is hopeful about the future.  He has tolerated tolerated the medication changes.  We will complete the adjustments in meds that was planned.  Discussed potential metabolic side effects associated with atypical antipsychotics, as well as potential risk for movement side effects. Advised pt to contact office if movement side effects occur.   He also needs counseling but the value  of that might be limited during the active time of the ECT because there is expected to be some short-term memory impairment.  Will later check TSH.  Increase the venlafaxine XR to the previous dose of 150 mg daily.    Other option disc with [facility] is nortriptyline.  After ECT restart lithium for prophylaxis, consider at next visit.  FU 4 weeks.  Call if symptoms relapse.  Meredith Staggers, MD, DFAPA   Future Appointments  Date Time Provider Department Center  04/16/2018  1:30 PM Cottle, Steva Ready., MD [facility] None    No orders of the defined types were placed in this encounter.     -------------------------------

## 2018-03-09 ENCOUNTER — Other Ambulatory Visit: Payer: Self-pay | Admitting: Psychiatry

## 2018-03-13 ENCOUNTER — Other Ambulatory Visit: Payer: Self-pay | Admitting: Psychiatry

## 2018-04-12 ENCOUNTER — Other Ambulatory Visit: Payer: Self-pay | Admitting: Psychiatry

## 2018-04-14 NOTE — Telephone Encounter (Signed)
Has office visit 02/19 but want to confirm he is suppose to be taking for refill

## 2018-04-16 ENCOUNTER — Encounter: Payer: Self-pay | Admitting: Psychiatry

## 2018-04-16 ENCOUNTER — Ambulatory Visit: Payer: BLUE CROSS/BLUE SHIELD | Admitting: Psychiatry

## 2018-04-16 DIAGNOSIS — F333 Major depressive disorder, recurrent, severe with psychotic symptoms: Secondary | ICD-10-CM

## 2018-04-16 DIAGNOSIS — F411 Generalized anxiety disorder: Secondary | ICD-10-CM

## 2018-04-16 DIAGNOSIS — R7989 Other specified abnormal findings of blood chemistry: Secondary | ICD-10-CM

## 2018-04-16 MED ORDER — OLANZAPINE 5 MG PO TABS: 5.0000 mg | ORAL_TABLET | Freq: Every day | ORAL | 1 refills | Status: DC

## 2018-04-16 NOTE — Progress Notes (Addendum)
Kelly Johnston [id number] April 20, 1970 48 y.o.  Subjective:   Patient ID:  Kelly Johnston is a 48 y.o. (DOB [birth year]) male.  Chief Complaint:  Chief Complaint  Patient presents with  . Follow-up    Medeication Management  . Depression  Follow-up of severe depression with psychosis, altered mental status and anxiety requiring ECT Last seen March 06, 2018 Kelly Johnston presents today for follow-up of the above.   Finished ECT and Effexor dose change.  Things going well.  Not depressed at all.   Anxiety is good.  No panic.  Sleep is good about 9 hours.   At the last visit multiple changes were made including increasing Effexor XR to 150 mg daily, adding olanzapine 10 mg nightly for treatment resistant depression with some psychotic features and severe anxiety, and increasing Cytomel for treatment resistant depression.  He also completed ECT treatments last week for the same.  He feels dramatically better than before.  The depression has resolved.  The anxiety is much more appropriate.  His thinking is clearer.  He has decided that he can no longer be a trial all your and feels at peace with that decision.  He is exploring other options.  He feels capable of thinking about those other options.  He is no longer ruminating.  He is sleeping well.  His appetite is good.  He has no suicidal thoughts.  He had a recent psych hosp at [facility].  Admitted with hallucinations, delusions and memory problems and had accidentally stopped psych meds.  He had a mixture of symptoms that was diagnosed is perhaps delirium related to benzodiazepine withdrawal as well as depression with psychotic features having just had an ECT treatment.  He was hospitalized at [facility] from December 3 until December 5.  He is set to resume ECT at [facility] on an outpatient basis on Monday for 3 times a week treatment.  He is completed ECT treatment  Prior psychiatric meds lithium, venlafaxine Exar, duloxetine 120,  fluoxetine 60, Wellbutrin 450, Abilify, Viibryd, Cytomel, Rexulti 2 mg, mirtazapine 45 mg, olanzapine 10 helpful for anxiety. 2 courses of ECT with the last completed December 2019  Review of Systems:   No tremor.  No confusion.  No headaches.  No balance problems.  No sedation.  Medications: I have reviewed the patient's current medications.  Current Outpatient Medications  Medication Sig Dispense Refill  . hydrALAZINE (APRESOLINE) 50 MG tablet Take 1 tablet (50 mg total) by mouth 3 (three) times daily. 90 tablet 1  . liothyronine (CYTOMEL) 25 MCG tablet Take 2 tablets (50 mcg total) by mouth daily. 60 tablet 1  . lisinopril (PRINIVIL,ZESTRIL) 40 MG tablet Take 1 tablet (40 mg total) by mouth daily. 30 tablet 1  . LORazepam (ATIVAN) 0.5 MG tablet 1 or 2 as needed for severe anxiety every 8 hours 60 tablet 1  . mirtazapine (REMERON) 45 MG tablet Take 1 tablet (45 mg total) by mouth at bedtime. 30 tablet 1  . venlafaxine XR (EFFEXOR-XR) 150 MG 24 hr capsule Take 1 capsule (150 mg total) by mouth daily with breakfast. 90 capsule 0  . OLANZapine (ZYPREXA) 5 MG tablet Take 1 tablet (5 mg total) by mouth at bedtime. 30 tablet 1   No current facility-administered medications for this visit.     Medication Side Effects: None  Allergies:  Allergies  Allergen Reactions  . Latex Rash    Past Medical History:  Diagnosis Date  . Anxiety   .  Benzodiazepine withdrawal (HCC)   . Chronic kidney disease    protein builds up  . Depression   . Heart murmur   . Hypertension   . IgA nephropathy   . Sleep apnea    cpap    Family History  Problem Relation Age of Onset  . Testicular cancer Father   . Alcohol abuse Cousin     Social History   Socioeconomic History  . Marital status: Divorced    Spouse name: Not on file  . Number of children: 3  . Years of education: Not on file  . Highest education level: Professional school degree (e.g., MD, DDS, DVM, JD)  Occupational History  .  Not on file  Social Needs  . Financial resource strain: Not hard at all  . Food insecurity:    Worry: Never true    Inability: Never true  . Transportation needs:    Medical: No    Non-medical: No  Tobacco Use  . Smoking status: Never Smoker  . Smokeless tobacco: Never Used  Substance and Sexual Activity  . Alcohol use: No  . Drug use: Not Currently    Types: Marijuana    Comment: last used 3 to 4 years ago  . Sexual activity: Yes    Birth control/protection: Other-see comments  Lifestyle  . Physical activity:    Days per week: 0 days    Minutes per session: 0 min  . Stress: Very much  Relationships  . Social connections:    Talks on phone: Not on file    Gets together: Not on file    Attends religious service: Never    Active member of club or organization: No    Attends meetings of clubs or organizations: Never    Relationship status: Divorced  . Intimate partner violence:    Fear of current or ex partner: No    Emotionally abused: No    Physically abused: No    Forced sexual activity: No  Other Topics Concern  . Not on file  Social History Narrative  . Not on file    Past Medical History, Surgical history, Social history, and Family history were reviewed and updated as appropriate.   Please see review of systems for further details on the patient's review from today.   Objective:   Physical Exam:  There were no vitals taken for this visit.  Physical Exam Neurological:     Motor: No tremor.     Gait: Gait normal.  Psychiatric:        Attention and Perception: He is attentive. He does not perceive auditory hallucinations.        Mood and Affect: Mood is not anxious or depressed. Affect is not flat.        Speech: Speech is not rapid and pressured or slurred.        Behavior: Behavior is not hyperactive. Behavior is cooperative.        Thought Content: Thought content is not paranoid or delusional. Thought content does not include homicidal or suicidal  ideation.        Cognition and Memory: Cognition normal. He does not exhibit impaired recent memory.     Comments: Insight and judgment much better. No cognitive deficits noted.  Aware of how to take meds.     Lab Review:     Component Value Date/Time   NA 142 01/09/2018 1513   K 3.9 01/09/2018 1513   CL 105 01/09/2018 1513   CO2 30  01/09/2018 1513   GLUCOSE 94 01/09/2018 1513   BUN 11 01/09/2018 1513   CREATININE 1.25 (H) 01/09/2018 1513   CREATININE 1.82 (H) 11/03/2013 1613   CALCIUM 8.9 01/09/2018 1513   PROT 6.9 05/30/2016 0409   ALBUMIN 3.2 (L) 05/30/2016 0409   AST 21 05/30/2016 0409   ALT 15 (L) 05/30/2016 0409   ALKPHOS 87 05/30/2016 0409   BILITOT 0.9 05/30/2016 0409   GFRNONAA >60 01/09/2018 1513   GFRNONAA 44 (L) 11/03/2013 1613   GFRAA >60 01/09/2018 1513   GFRAA 51 (L) 11/03/2013 1613       Component Value Date/Time   WBC 8.9 01/09/2018 1513   RBC 5.29 01/09/2018 1513   HGB 16.2 01/09/2018 1513   HCT 50.2 01/09/2018 1513   PLT 257 01/09/2018 1513   MCV 94.9 01/09/2018 1513   MCV 90.6 11/03/2013 1613   MCH 30.6 01/09/2018 1513   MCHC 32.3 01/09/2018 1513   RDW 12.0 01/09/2018 1513   LYMPHSABS 1.9 05/30/2016 0409   MONOABS 1.3 (H) 05/30/2016 0409   EOSABS 0.4 05/30/2016 0409   BASOSABS 0.0 05/30/2016 0409    No results found for: POCLITH, LITHIUM   No results found for: PHENYTOIN, PHENOBARB, VALPROATE, CBMZ   .res Assessment: Plan:    Severe episode of recurrent major depressive disorder, with psychotic features (HCC)  Generalized anxiety disorder  Low serum vitamin D   Mr. Bevard has had a severe episode of depression with psychotic features and anxiety and cognitive impairment which has responded to ECT plus medication changes.  He is completed ECT.  He is markedly better and in remission.  He states the depression has resolved.  He is thinking is clearer and he is no longer ruminating.  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  Start weaning Zyprexa.  Reduce to 5 mg daily.  Call if a problem.  Discussed potential metabolic side effects associated with atypical antipsychotics, as well as potential risk for movement side effects. Advised pt to contact office if movement side effects occur.   Will later check TSH.  continue the venlafaxine XR to the previous dose of 150 mg daily.    Other option disc is nortriptyline. Consider start lithium for prophylaxis, consider at next visit or as he comes off Zyprexa.  FU 6-8 weeks.  Call if symptoms relapse.  Meredith Staggers, MD, DFAPA   Future Appointments  Date Time Provider Department Center  05/28/2018  9:30 AM Cottle, Steva Ready., MD [facility] None    No orders of the defined types were placed in this encounter.     -------------------------------

## 2018-04-16 NOTE — Patient Instructions (Signed)
Reduce olanzapine to 1/2 of 10 mg tablet or 5 mg nightly.

## 2018-04-21 ENCOUNTER — Other Ambulatory Visit: Payer: Self-pay | Admitting: Psychiatry

## 2018-05-28 ENCOUNTER — Ambulatory Visit: Payer: BLUE CROSS/BLUE SHIELD | Admitting: Psychiatry

## 2018-06-02 ENCOUNTER — Other Ambulatory Visit: Payer: Self-pay | Admitting: Psychiatry

## 2018-06-04 ENCOUNTER — Other Ambulatory Visit: Payer: Self-pay | Admitting: Psychiatry

## 2018-06-04 DIAGNOSIS — F333 Major depressive disorder, recurrent, severe with psychotic symptoms: Secondary | ICD-10-CM

## 2018-06-18 ENCOUNTER — Other Ambulatory Visit: Payer: Self-pay | Admitting: Psychiatry

## 2018-06-30 ENCOUNTER — Other Ambulatory Visit: Payer: Self-pay

## 2018-06-30 ENCOUNTER — Encounter: Payer: Self-pay | Admitting: Psychiatry

## 2018-06-30 ENCOUNTER — Ambulatory Visit (INDEPENDENT_AMBULATORY_CARE_PROVIDER_SITE_OTHER): Payer: BLUE CROSS/BLUE SHIELD | Admitting: Psychiatry

## 2018-06-30 DIAGNOSIS — F411 Generalized anxiety disorder: Secondary | ICD-10-CM | POA: Diagnosis not present

## 2018-06-30 DIAGNOSIS — F333 Major depressive disorder, recurrent, severe with psychotic symptoms: Secondary | ICD-10-CM

## 2018-06-30 DIAGNOSIS — R7989 Other specified abnormal findings of blood chemistry: Secondary | ICD-10-CM | POA: Diagnosis not present

## 2018-06-30 NOTE — Progress Notes (Signed)
Kelly Okhomas E Broz [id number] 1970-09-04 48 y.o.  Subjective:   Patient ID:  Kelly Johnston is a 48 y.o. (DOB 1970-09-04) male.  Virtual Visit via Telephone Note  I connected with pt by telephone and verified that I am speaking with the correct person using two identifiers.   I discussed the limitations, risks, security and privacy concerns of performing an evaluation and management service by telephone and the availability of in person appointments. I also discussed with the patient that there may be a patient responsible charge related to this service. The patient expressed understanding and agreed to proceed.  I discussed the assessment and treatment plan with the patient. The patient was provided an opportunity to ask questions and all were answered. The patient agreed with the plan and demonstrated an understanding of the instructions.   The patient was advised to call back or seek an in-person evaluation if the symptoms worsen or if the condition fails to improve as anticipated.  I provided 15 minutes of non-face-to-face time during this encounter. The call started at 215 and ended at 230. The patient was located at home and the provider was located at office.  Chief Complaint:  Chief Complaint  Patient presents with  . Follow-up    Medication Managment  . Depression    Medication Managment    Kelly Johnston Follow-up of severe depression with psychosis, altered mental status and anxiety requiring ECT  Visit April 16, 2018.  We started weaning the olanzapine and reduce to 5 mg daily.  Things are good feeling good.  No problems with the meds.  Patient reports stable mood and denies depressed or irritable moods. No depression after ECT.  Patient denies any recent difficulty with anxiety.  Patient denies difficulty with sleep initiation or maintenance. Denies appetite disturbance.  Patient reports that energy and motivation have been good.  Patient denies any difficulty with  concentration.  Patient denies any suicidal ideation.  Finished ECT and Effexor dose change.  Things going well.  Not depressed at all.   Anxiety is good.  No panic.  Sleep is good about 9 hours.  He feels dramatically better than before.  The depression has resolved.  The anxiety is much more appropriate.  His thinking is clearer.  He has decided that he can no longer be a trial all your and feels at peace with that decision.  He is exploring other options.  He feels capable of thinking about those other options.  He is no longer ruminating.  He is sleeping well.  His appetite is good.  He has no suicidal thoughts.  Almost never using Ativan.  He had a [facility] hosp [facility].  Admitted with hallucinations, delusions and memory problems and had accidentally stopped psych meds.  He had a mixture of symptoms that was diagnosed is perhaps delirium related to benzodiazepine withdrawal as well as depression with psychotic features having just had an ECT treatment.  [facility] was [facility] from December 3 until December 5.  He is set to resume ECT at [facility] on an outpatient basis on Monday for 3 times a week treatment.  He is completed ECT treatment  Prior psychiatric meds lithium, venlafaxine XR, duloxetine 120, fluoxetine 60, Wellbutrin 450, Abilify, Viibryd, Cytomel, Rexulti 2 mg, mirtazapine 45 mg, olanzapine 10 helpful for anxiety. 2 courses of ECT with the last completed December 2019  Review of Systems:   No tremor.  No weakness.  No tremor.  No  confusion.  No headaches.  No balance problems.  No sedation.  Medications: I have reviewed the patient's current medications.  Current Outpatient Medications  Medication Sig Dispense Refill  . hydrALAZINE (APRESOLINE) 50 MG tablet TAKE ONE TABLET BY MOUTH THREE TIMES A DAY 90 tablet 1  . liothyronine (CYTOMEL) 25 MCG tablet TAKE TWO TABLETS BY MOUTH DAILY 60 tablet 1  . lisinopril (PRINIVIL,ZESTRIL) 40 MG tablet TAKE  ONE TABLET BY MOUTH DAILY 30 tablet 2  . LORazepam (ATIVAN) 0.5 MG tablet 1 or 2 as needed for severe anxiety every 8 hours 60 tablet 1  . mirtazapine (REMERON) 45 MG tablet TAKE ONE TABLET BY MOUTH EVERY NIGHT AT BEDTIME 30 tablet 2  . OLANZapine (ZYPREXA) 5 MG tablet TAKE ONE TABLET BY MOUTH EVERY NIGHT AT BEDTIME 30 tablet 1  . venlafaxine XR (EFFEXOR-XR) 150 MG 24 hr capsule TAKE 1 CAPSULE(S) BY MOUTH DAILY WITH BREAKFAST 90 capsule 0   No current facility-administered medications for this visit.     Medication Side Effects: None  Allergies:  Allergies  Allergen Reactions  . Latex Rash    Past Medical History:  Diagnosis Date  . Anxiety   . Benzodiazepine withdrawal (HCC)   . Chronic kidney disease    protein builds up  . Depression   . Heart murmur   . Hypertension   . IgA nephropathy   . Sleep apnea    cpap    Family History  Problem Relation Age of Onset  . Testicular cancer Father   . Alcohol abuse Cousin     Social History   Socioeconomic History  . Marital status: Divorced    Spouse name: Not on file  . Number of children: 3  . Years of education: Not on file  . Highest education level: Professional school degree (e.g., MD, DDS, DVM, JD)  Occupational History  . Not on file  Social Needs  . Financial resource strain: Not hard at all  . Food insecurity:    Worry: Never true    Inability: Never true  . Transportation needs:    Medical: No    Non-medical: No  Tobacco Use  . Smoking status: Never Smoker  . Smokeless tobacco: Never Used  Substance and Sexual Activity  . Alcohol use: No  . Drug use: Not Currently    Types: Marijuana    Comment: last used 3 to 4 years ago  . Sexual activity: Yes    Birth control/protection: Other-see comments  Lifestyle  . Physical activity:    Days per week: 0 days    Minutes per session: 0 min  . Stress: Very much  Relationships  . Social connections:    Talks on phone: Not on file    Gets together: Not on  file    Attends religious service: Never    Active member of club or organization: No    Attends meetings of clubs or organizations: Never    Relationship status: Divorced  . Intimate partner violence:    Fear of current or ex partner: No    Emotionally abused: No    Physically abused: No    Forced sexual activity: No  Other Topics Concern  . Not on file  Social History Narrative  . Not on file    Past Medical History, Surgical history, Social history, and Family history were reviewed and updated as appropriate.   Please see review of systems for further details on the patient's review from today.   Objective:  Physical Exam:  There were no vitals taken for this visit.  Physical Exam Neurological:     Mental Status: He is alert and oriented to person, place, and time.     Cranial Nerves: No dysarthria.  Psychiatric:        Attention and Perception: Attention normal.        Mood and Affect: Mood is not anxious or depressed.        Behavior: Behavior is cooperative.        Thought Content: Thought content normal. Thought content is not paranoid or delusional. Thought content does not include homicidal or suicidal ideation. Thought content does not include homicidal or suicidal plan.        Cognition and Memory: Cognition and memory normal.        Judgment: Judgment normal.     Comments: Only anxiety is appropriate to the current circumstances of Covid and job transition.  Not depressed at all.  He is hypoverbal per usual but answers clearly any questions put forward.  Insight good no evidence for psychosis     Lab Review:     Component Value Date/Time   NA 142 01/09/2018 1513   K 3.9 01/09/2018 1513   CL 105 01/09/2018 1513   CO2 30 01/09/2018 1513   GLUCOSE 94 01/09/2018 1513   BUN 11 01/09/2018 1513   CREATININE 1.25 (H) 01/09/2018 1513   CREATININE 1.82 (H) 11/03/2013 1613   CALCIUM 8.9 01/09/2018 1513   PROT 6.9 05/30/2016 0409   ALBUMIN 3.2 (L) 05/30/2016  0409   AST 21 05/30/2016 0409   ALT 15 (L) 05/30/2016 0409   ALKPHOS 87 05/30/2016 0409   BILITOT 0.9 05/30/2016 0409   GFRNONAA >60 01/09/2018 1513   GFRNONAA 44 (L) 11/03/2013 1613   GFRAA >60 01/09/2018 1513   GFRAA 51 (L) 11/03/2013 1613       Component Value Date/Time   WBC 8.9 01/09/2018 1513   RBC 5.29 01/09/2018 1513   HGB 16.2 01/09/2018 1513   HCT 50.2 01/09/2018 1513   PLT 257 01/09/2018 1513   MCV 94.9 01/09/2018 1513   MCV 90.6 11/03/2013 1613   MCH 30.6 01/09/2018 1513   MCHC 32.3 01/09/2018 1513   RDW 12.0 01/09/2018 1513   LYMPHSABS 1.9 05/30/2016 0409   MONOABS 1.3 (H) 05/30/2016 0409   EOSABS 0.4 05/30/2016 0409   BASOSABS 0.0 05/30/2016 0409    No results found for: POCLITH, LITHIUM   No results found for: PHENYTOIN, PHENOBARB, VALPROATE, CBMZ   .res Assessment: Plan:    Severe episode of recurrent major depressive disorder, with psychotic features (HCC)  Generalized anxiety disorder  Low serum vitamin D   Mr. Talaga has had a severe episode of depression with psychotic features and anxiety and cognitive impairment which has responded to ECT plus medication changes.  He is completed ECT.  He is markedly better and in remission.  He states the depression has resolved.  He is thinking is clearer and he is no longer ruminating.  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  continue weaning Zyprexa.  Reduce to 2.5 mg daily for  2 weeks then stop it.  Call if a problem.  I would not expect any recurrence of psychotic symptoms given the absence of depression.  Discussed potential metabolic side effects associated with atypical antipsychotics, as well as potential risk for movement side effects. Advised pt to contact office if movement side effects occur.  Will later check TSH.  continue the venlafaxine XR to the previous dose of 150 mg daily.    Other option disc is nortriptyline.  Consider start lithium for  prophylaxis, consider at next visit or as he comes off Zyprexa.  Would prefer an in office visit to discuss this in more detail but if we are unable to meet in person we will discuss at the follow-up appointment.  FU 10 weeks.  Call if symptoms relapse.  Meredith Staggers, MD, DFAPA   No future appointments.  No orders of the defined types were placed in this encounter.     -------------------------------

## 2018-07-17 ENCOUNTER — Other Ambulatory Visit: Payer: Self-pay | Admitting: Psychiatry

## 2018-07-19 ENCOUNTER — Other Ambulatory Visit: Payer: Self-pay | Admitting: Psychiatry

## 2018-08-06 ENCOUNTER — Other Ambulatory Visit: Payer: Self-pay

## 2018-08-06 ENCOUNTER — Encounter: Payer: Self-pay | Admitting: Psychiatry

## 2018-08-06 ENCOUNTER — Ambulatory Visit (INDEPENDENT_AMBULATORY_CARE_PROVIDER_SITE_OTHER): Payer: BC Managed Care – PPO | Admitting: Psychiatry

## 2018-08-06 DIAGNOSIS — F411 Generalized anxiety disorder: Secondary | ICD-10-CM

## 2018-08-06 DIAGNOSIS — R7989 Other specified abnormal findings of blood chemistry: Secondary | ICD-10-CM | POA: Diagnosis not present

## 2018-08-06 DIAGNOSIS — F333 Major depressive disorder, recurrent, severe with psychotic symptoms: Secondary | ICD-10-CM | POA: Diagnosis not present

## 2018-08-06 MED ORDER — OLANZAPINE 5 MG PO TABS: 5.0000 mg | ORAL_TABLET | Freq: Every day | ORAL | 1 refills | Status: DC

## 2018-08-06 MED ORDER — VENLAFAXINE HCL ER 75 MG PO CP24: 225.0000 mg | ORAL_CAPSULE | Freq: Every day | ORAL | 1 refills | Status: DC

## 2018-08-06 NOTE — Progress Notes (Signed)
Kelly Johnston [id number] Jun 28, 1970 48 y.o.  Subjective:   Patient ID:  Kelly Johnston is a 48 y.o. (DOB Jun 28, 1970) male.  Chief Complaint:  Chief Complaint  Patient presents with  . Follow-up    Medication Management  . Depression    Medication Management    Kelly Johnston Follow-up of severe depression with psychosis, altered mental status and anxiety requiring ECT  Last visit was Jun 30, 2018.  We completed weaned off olanzapine and discontinued it.  I feel like I've been slipping lately.  Lots of anxiety and worry lately.  Noticed a change lately for several weeks.  Worried and paralyzed at times.  Comes and goes.  On verge of tears a lot.  Can't turn off ruminating thoughts, self doubt and feeling like a failure.  Initial and terminal insomnia.   Is okay.  Concentration is impaired.  Feelings of hopelessness helplessness and some death thoughts without suicidal intent or plan.  Was completely in remission on May 4 when seen.  The only change since then has been discontinuing olanzapine.  Almost never using Ativan.  [facility].  Admitted with hallucinations, delusions and memory problems and had accidentally stopped psych meds.  He had a mixture of symptoms that was diagnosed is perhaps delirium related to benzodiazepine withdrawal as well as depression with psychotic features having just had an ECT treatment.  He was hospitalized at [facility] from December 3 until December 5.    He completed ECT treatment  Prior psychiatric meds lithium, venlafaxine XR150 , duloxetine 120, fluoxetine 60, Wellbutrin 450, Abilify, Viibryd, Cytomel, Rexulti 2 mg, mirtazapine 45 mg, olanzapine 10 helpful for anxiety. 2 courses of ECT with the last completed December 2019  Review of Systems:   No tremor.  No weakness.  No tremor.  No confusion.  No headaches.  No balance problems.  No sedation.  Medications: I have reviewed the patient's current  medications.  Current Outpatient Medications  Medication Sig Dispense Refill  . hydrALAZINE (APRESOLINE) 50 MG tablet TAKE ONE TABLET BY MOUTH THREE TIMES A DAY 90 tablet 1  . liothyronine (CYTOMEL) 25 MCG tablet TAKE TWO TABLETS BY MOUTH DAILY 60 tablet 1  . lisinopril (ZESTRIL) 40 MG tablet TAKE ONE TABLET BY MOUTH DAILY 90 tablet 1  . LORazepam (ATIVAN) 0.5 MG tablet 1 or 2 as needed for severe anxiety every 8 hours 60 tablet 1  . mirtazapine (REMERON) 45 MG tablet TAKE ONE TABLET BY MOUTH EVERY NIGHT AT BEDTIME 30 tablet 1  . venlafaxine XR (EFFEXOR-XR) 75 MG 24 hr capsule Take 3 capsules (225 mg total) by mouth daily with breakfast. 90 capsule 1  . OLANZapine (ZYPREXA) 5 MG tablet Take 1 tablet (5 mg total) by mouth at bedtime. 30 tablet 1   No current facility-administered medications for this visit.     Medication Side Effects: None  Allergies:  Allergies  Allergen Reactions  . Latex Rash    Past Medical History:  Diagnosis Date  . Anxiety   . Benzodiazepine withdrawal (HCC)   . Chronic kidney disease    protein builds up  . Depression   . Heart murmur   . Hypertension   . IgA nephropathy   . Sleep apnea    cpap    Family History  Problem Relation Age of Onset  . Testicular cancer Father   . Alcohol abuse Cousin     Social History   Socioeconomic History  .  Marital status: Divorced    Spouse name: Not on file  . Number of children: 3  . Years of education: Not on file  . Highest education level: Professional school degree (e.g., MD, DDS, DVM, JD)  Occupational History  . Not on file  Social Needs  . Financial resource strain: Not hard at all  . Food insecurity:    Worry: Never true    Inability: Never true  . Transportation needs:    Medical: No    Non-medical: No  Tobacco Use  . Smoking status: Never Smoker  . Smokeless tobacco: Never Used  Substance and Sexual Activity  . Alcohol use: No  . Drug use: Not Currently    Types: Marijuana     Comment: last used 3 to 4 years ago  . Sexual activity: Yes    Birth control/protection: Other-see comments  Lifestyle  . Physical activity:    Days per week: 0 days    Minutes per session: 0 min  . Stress: Very much  Relationships  . Social connections:    Talks on phone: Not on file    Gets together: Not on file    Attends religious service: Never    Active member of club or organization: No    Attends meetings of clubs or organizations: Never    Relationship status: Divorced  . Intimate partner violence:    Fear of current or ex partner: No    Emotionally abused: No    Physically abused: No    Forced sexual activity: No  Other Topics Concern  . Not on file  Social History Narrative  . Not on file    Past Medical History, Surgical history, Social history, and Family history were reviewed and updated as appropriate.   Please see review of systems for further details on the patient's review from today.   Objective:   Physical Exam:  There were no vitals taken for this visit.  Physical Exam Neurological:     Mental Status: He is alert and oriented to person, place, and time.     Cranial Nerves: No dysarthria.  Psychiatric:        Attention and Perception: Attention normal.        Mood and Affect: Mood is anxious and depressed.        Behavior: Behavior is cooperative.        Thought Content: Thought content normal. Thought content is not paranoid or delusional. Thought content does not include homicidal or suicidal ideation. Thought content does not include homicidal or suicidal plan.        Cognition and Memory: Cognition and memory normal.        Judgment: Judgment normal.     Comments: Severe relapse of depression and anxiety in the last 4 weeks with rumination.  He is hypoverbal per usual but answers clearly any questions put forward.  Insight good no evidence for psychosis     Lab Review:     Component Value Date/Time   NA 142 01/09/2018 1513   K 3.9  01/09/2018 1513   CL 105 01/09/2018 1513   CO2 30 01/09/2018 1513   GLUCOSE 94 01/09/2018 1513   BUN 11 01/09/2018 1513   CREATININE 1.25 (H) 01/09/2018 1513   CREATININE 1.82 (H) 11/03/2013 1613   CALCIUM 8.9 01/09/2018 1513   PROT 6.9 05/30/2016 0409   ALBUMIN 3.2 (L) 05/30/2016 0409   AST 21 05/30/2016 0409   ALT 15 (L) 05/30/2016 0409   ALKPHOS  87 05/30/2016 0409   BILITOT 0.9 05/30/2016 0409   GFRNONAA >60 01/09/2018 1513   GFRNONAA 44 (L) 11/03/2013 1613   GFRAA >60 01/09/2018 1513   GFRAA 51 (L) 11/03/2013 1613       Component Value Date/Time   WBC 8.9 01/09/2018 1513   RBC 5.29 01/09/2018 1513   HGB 16.2 01/09/2018 1513   HCT 50.2 01/09/2018 1513   PLT 257 01/09/2018 1513   MCV 94.9 01/09/2018 1513   MCV 90.6 11/03/2013 1613   MCH 30.6 01/09/2018 1513   MCHC 32.3 01/09/2018 1513   RDW 12.0 01/09/2018 1513   LYMPHSABS 1.9 05/30/2016 0409   MONOABS 1.3 (H) 05/30/2016 0409   EOSABS 0.4 05/30/2016 0409   BASOSABS 0.0 05/30/2016 0409    No results found for: POCLITH, LITHIUM   No results found for: PHENYTOIN, PHENOBARB, VALPROATE, CBMZ   .res Assessment: Plan:    Severe episode of recurrent major depressive disorder, with psychotic features ([place]) - Plan: venlafaxine XR (EFFEXOR-XR) 75 MG 24 hr capsule, OLANZapine (ZYPREXA) 5 MG tablet  Generalized anxiety disorder  Low serum vitamin D   Mr. Tates has had a severe episode of depression with psychotic features and anxiety and cognitive impairment which has responded to ECT plus medication changes.  He is completed ECT.  He was markedly better and in remission at his last visit on May 4.  At that time we discontinued olanzapine and he has relapsed severely.  Relapse off Zyprexa.  Restart olanzapine 5 mg daily for rumination, depressive relapse, and anxiety and sleep. Discussed potential metabolic side effects associated with atypical antipsychotics, as well as potential risk for movement side effects. Advised pt  to contact office if movement side effects occur.   Will later check TSH.  Venlafaxine failed to prevent this depressive recurrence once he stopped the olanzapine.  He is never been on a higher dosage.  He is not having side effects.  Therefore, Increase venlafaxine XR 225 mg daily.  Other option disc is nortriptyline.  Consider start lithium for prophylaxis, consider at next visit or as he comes off Zyprexa.  Would prefer an in office visit to discuss this in more detail but if we are unable to meet in person we will discuss at the follow-up appointment.  Rec counseling for CBT with Dr. Thalia Party, MD, DFAPA   Future Appointments  Date Time Provider [place]  08/18/2018 11:00 AM Blanchie Serve, PhD [facility] None  08/28/2018  4:45 PM Cottle, Billey Co., MD [facility] None    No orders of the defined types were placed in this encounter.     -------------------------------

## 2018-08-06 NOTE — Patient Instructions (Signed)
Restart olanzapine 5 mg daily in evening  Increase venlafaxine to 225 mg daily.

## 2018-08-07 ENCOUNTER — Other Ambulatory Visit: Payer: Self-pay | Admitting: Psychiatry

## 2018-08-15 ENCOUNTER — Other Ambulatory Visit: Payer: Self-pay | Admitting: Psychiatry

## 2018-08-18 ENCOUNTER — Other Ambulatory Visit: Payer: Self-pay | Admitting: Psychiatry

## 2018-08-18 ENCOUNTER — Ambulatory Visit: Payer: BC Managed Care – PPO | Admitting: Psychiatry

## 2018-08-21 ENCOUNTER — Ambulatory Visit (INDEPENDENT_AMBULATORY_CARE_PROVIDER_SITE_OTHER): Payer: BC Managed Care – PPO | Admitting: Psychiatry

## 2018-08-21 ENCOUNTER — Other Ambulatory Visit: Payer: Self-pay

## 2018-08-21 DIAGNOSIS — F411 Generalized anxiety disorder: Secondary | ICD-10-CM

## 2018-08-21 DIAGNOSIS — R69 Illness, unspecified: Secondary | ICD-10-CM

## 2018-08-21 DIAGNOSIS — Z87898 Personal history of other specified conditions: Secondary | ICD-10-CM

## 2018-08-21 DIAGNOSIS — F331 Major depressive disorder, recurrent, moderate: Secondary | ICD-10-CM | POA: Diagnosis not present

## 2018-08-21 NOTE — Progress Notes (Signed)
PROBLEM-FOCUSED INITIAL PSYCHOTHERAPY EVALUATION Marliss CzarAndy Brooklyn Jeff, PhD LP [facility]  Name: Kelly Johnston Sick Date: 08/21/2018 Time spent: 60 min MRN: [id number] DOB: [birth year] Guardian/Payee: self  PCP: Patient, No Pcp Per Documentation requested on this visit: No  PROBLEM HISTORY Reason for Visit /Presenting Problem:  Chief Complaint  Patient presents with  . Establish Care  . Depression  . Stress    Narrative/History of Present Illness Referred by Kelly Johnston for treatment of resistant depression, demotivation, and paralyzing anxiety transitioning career.  PT reports episodes of being paralyzed by fear and anxiety, sense of hopeless, paralyzed in work, inability to engage tasks, fear of failure.  Episodes date back to high school, maybe college, when these would begin.  No identifiable trigger, but would have these episodes of dark, dismal, strongly self-defeating thinking.  Dx'd 20s.  Alcohol used to be a problem.  No hx of rehab or detox.  No noted family history of depression or anxiety disordered.  Patchy memory for hospitalization, chart reflects having had severe MDE with psychotic features, also hx of severe skin infections and vit D deficiency.    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  Will goad himself to get out there and work the field and collapse back in anxiety, resistance, avoidance.  Knows he has to be able to make cold calls, cultivate business, work relationships, but can't get himself engage.  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  Parents were realtors. Knows he wants to be own boss and not have to deal much with close supervision.  Lives with father, who he thinks could guide him, and is being mentored by a coworker in a relatively Radio broadcast assistantlarge realty outfit.  Wants help getting past doubts and demotivation.    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  Got  disillusioned with being part of a process that seemed to turn out large numbers of people with long debt and short prospects.  Also felt like he was getting paid well for delivering little, accomplice to a system that was of little value.  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  Originally had worked in a [employer] firm but disliked the long hours and felt unsuited to litigation.    Wife was a Arts administratorfellow law student.  Divorced 5 years, amicably.  Three kids (11, 14, 16), no focal issues to deal with. primary custody with their mother.  Differ in parenting styles, wife more permissive.  Father moved here last winter,  Current situation is 4 days per 2 weeks moves into the house the kids live in steadily.  Father is 83yo, moved in from [place].  Adjustment having a roommate, but no complaints.  Never that close with father, felt brother better attached, had more in common.  PT grew up more the intellectual.  Forgiven, not bothered.  Father concerned about lacking motivation.    Had a benzodiazepine withdrawal delirium last fall, the only psychosis recalled.    Prior Psychiatric Assessment/Treatment:   Outpatient treatment history: Kelly Johnston for medication, other hx not assessed, may be in paper chart [facility] History of psychological assessment/testing: none stated   Abuse History: Victim of abuse: No.   Victim of neglect: No.   Perpetrator of abuse/neglect: No.   Witness / Exposure to Domestic Violence: No.  Witness to [employer] Violence:  No.   Protective Services Involvement: No.   Report needed: No.    Substance Abuse History: Current substance abuse: No.   History of impactful substance use/abuse: alcohol, historically   FAMILY/SOCIAL HISTORY Family of origin -- Father living.  Other information deferred. Current family -- divorced, lives with father, amicable  relationship with ex-wife and three children with the two parents moving in/out with kids when custody shifts Education -- Chartered certified accountantlaw school graduate Finances -- employment History of military service -- Not assessed at this time / none suspected.   Spiritually -- Agnostic Enjoyable activities -- Not assessed.  Other situational factors affecting treatment and prognosis: Stressors from the following areas: finances, occupational status Barriers to service: none stated  Notable cultural sensitivities: none stated Strengths: none stated   MED/SURG HISTORY Med/surg history was not reviewed with PT at this time.  Noted hx of several systems affected and impression of self-neglect, historically. Past Medical History:  Diagnosis Date  . Anxiety   . Benzodiazepine withdrawal (HCC)   . Chronic kidney disease    protein builds up  . Depression   . Heart murmur   . Hypertension   . IgA nephropathy   . Sleep apnea    cpap     Past Surgical History:  Procedure Laterality Date  . chest surgery    . INCISION AND DRAINAGE ABSCESS  01/16/2012   Procedure: INCISION AND DRAINAGE ABSCESS;  Surgeon: Wilmon ArmsMatthew K. Corliss Skainssuei, MD;  Location: [facility];  Service: General;  Laterality: Left;  . PECTUS EXCAVATUM REPAIR      Allergies  Allergen Reactions  . Latex Rash    Medications (as listed in Epic): Current Outpatient Medications  Medication Sig Dispense Refill  . hydrALAZINE (APRESOLINE) 50 MG tablet TAKE ONE TABLET BY MOUTH THREE TIMES A DAY 90 tablet 3  . liothyronine (CYTOMEL) 25 MCG tablet TAKE TWO TABLETS BY MOUTH DAILY 60 tablet 2  . lisinopril (ZESTRIL) 40 MG tablet TAKE ONE TABLET BY MOUTH DAILY 90 tablet 1  . LORazepam (ATIVAN) 0.5 MG tablet 1 or 2 as needed for severe anxiety every 8 hours 60 tablet 1  . mirtazapine (REMERON) 45 MG tablet TAKE ONE TABLET BY MOUTH EVERY NIGHT AT BEDTIME 30 tablet 1  . OLANZapine (ZYPREXA) 5 MG tablet Take 1 tablet (5 mg total) by mouth at bedtime. 30 tablet 1   . sildenafil (VIAGRA) 100 MG tablet TAKE ONE TABLET BY MOUTH DAILY AS NEEDED 30 tablet 0  . venlafaxine XR (EFFEXOR-XR) 75 MG 24 hr capsule Take 3 capsules (225 mg total) by mouth daily with breakfast. 90 capsule 1   No current facility-administered medications for this visit.     MENTAL STATUS AND OBSERVATIONS Appearance:   Casual     Behavior:  Appropriate and reserved  Motor:  Normal  Speech/Language:   Clear and Coherent  Affect:  Appropriate and Restricted  Mood:  anxious  Thought process:  normal  Thought content:    WNL  Sensory/Perceptual disturbances:    WNL  Orientation:  grossly intact  Attention:  Good  Concentration:  Good  Memory:  WNL  Fund of knowledge:   Good  Insight:    Fair  Judgment:   Fair  Impulse Control:  Fair  Initial Risk Assessment: Danger to Self: No Self-injurious Behavior: No Danger to Others: No Physical Aggression / Violence: No Duty to Warn: no Access to Firearms a concern: Not assessed at this time / none suspected Gang  Involvement: No Patient / guardian was educated about steps to take if suicide or homicide risk level increases between visits: yes . While future psychiatric events cannot be accurately predicted, the patient does not currently require acute inpatient psychiatric care and does not currently meet [facility] involuntary commitment criteria.   DIAGNOSIS:    ICD-10-CM   1. Generalized anxiety disorder  F41.1   2. Major depressive disorder, recurrent episode, moderate (HCC)  F33.1    s/p severe w/ psychotic features  3. Suspected condition  R69    possible Autistic spectrum disorder (Asperger's) per precribing physician; seems unlikely at this time, more intellectualizing than neuroatypical  4. History of alcohol use disorder  Z87.898     INITIAL TREATMENT: . Ethical orientation and verbal consents to o privacy rights including, but not limited to, HIPAA provisions and any questions, EMR and use of Johnston-PHI o patient  responsibilities, including scheduling and fair notice of changes, method of visit options and regulatory and financial conditions affecting them o expectations for working relationship in psychotherapy o expectations and consents for working partnerships with other health care disciplines, especially including medication and other behavioral health providers . Support/validation . Psychoeducation about procrastination/demotivation and solution-oriented approach . 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  Also can be rewarding to connect a client with their dream, and to advocate for them (former Pharmacist, hospital of negotiation).  Does find cold calls uncomfortable, as an introvert.  Discussed breaking down tasks expected of himself into smaller parts, reframed the task as ice-breaking, just starting, rather than seeing an important call through to sale and success.  Important to limit self-expectations to the initial effort, let ramifications and rewards come from attempting.    Plan: . Break down the task of making calls -- pick out a few friendliest options, just make a call to inform and offer his services.   Marland Kitchen Option to pick a couple throwaway calls and just make a cold call, expect to fail it.   Marland Kitchen Option to imagine in more detail how to purposefully make a bad call, release tension by mentally practicing worst possible calls, go until he himself has to laugh. . Maintain medication as prescribed and work faithfully with relevant prescriber(s) if any changes are desired or seem indicated . Call the clinic on-call service, present to ER, or call 911 if any life-threatening psychiatric crisis Return in about 2 weeks (around 09/04/2018) for set up as in-person.  Blanchie Serve, PhD  Luan Moore, PhD LP Clinical Psychologist, [facility] [address] [place], [place]  [id number] [phone]

## 2018-08-28 ENCOUNTER — Ambulatory Visit: Payer: BC Managed Care – PPO | Admitting: Psychiatry

## 2018-08-31 ENCOUNTER — Other Ambulatory Visit: Payer: Self-pay | Admitting: Psychiatry

## 2018-08-31 DIAGNOSIS — F333 Major depressive disorder, recurrent, severe with psychotic symptoms: Secondary | ICD-10-CM

## 2018-09-05 ENCOUNTER — Ambulatory Visit: Payer: BC Managed Care – PPO | Admitting: Psychiatry

## 2018-09-12 ENCOUNTER — Other Ambulatory Visit: Payer: Self-pay

## 2018-09-12 ENCOUNTER — Ambulatory Visit (INDEPENDENT_AMBULATORY_CARE_PROVIDER_SITE_OTHER): Payer: BC Managed Care – PPO | Admitting: Psychiatry

## 2018-09-12 DIAGNOSIS — Z87898 Personal history of other specified conditions: Secondary | ICD-10-CM

## 2018-09-12 DIAGNOSIS — F331 Major depressive disorder, recurrent, moderate: Secondary | ICD-10-CM

## 2018-09-12 DIAGNOSIS — F4010 Social phobia, unspecified: Secondary | ICD-10-CM

## 2018-09-15 ENCOUNTER — Ambulatory Visit: Payer: BLUE CROSS/BLUE SHIELD | Admitting: Psychiatry

## 2018-09-19 ENCOUNTER — Other Ambulatory Visit: Payer: Self-pay | Admitting: Psychiatry

## 2018-09-22 ENCOUNTER — Other Ambulatory Visit: Payer: Self-pay

## 2018-09-22 DIAGNOSIS — R6889 Other general symptoms and signs: Secondary | ICD-10-CM | POA: Diagnosis not present

## 2018-09-22 DIAGNOSIS — Z20822 Contact with and (suspected) exposure to covid-19: Secondary | ICD-10-CM

## 2018-09-24 LAB — NOVEL CORONAVIRUS, NAA: SARS-CoV-2, NAA: NOT DETECTED

## 2018-09-29 ENCOUNTER — Ambulatory Visit: Payer: BC Managed Care – PPO | Admitting: Psychiatry

## 2018-10-05 ENCOUNTER — Other Ambulatory Visit: Payer: Self-pay | Admitting: Psychiatry

## 2018-10-05 DIAGNOSIS — F333 Major depressive disorder, recurrent, severe with psychotic symptoms: Secondary | ICD-10-CM

## 2018-10-12 ENCOUNTER — Other Ambulatory Visit: Payer: Self-pay | Admitting: Psychiatry

## 2018-10-12 DIAGNOSIS — F333 Major depressive disorder, recurrent, severe with psychotic symptoms: Secondary | ICD-10-CM

## 2018-10-13 ENCOUNTER — Ambulatory Visit: Payer: BC Managed Care – PPO | Admitting: Psychiatry

## 2018-10-23 ENCOUNTER — Other Ambulatory Visit: Payer: Self-pay

## 2018-10-23 ENCOUNTER — Ambulatory Visit (INDEPENDENT_AMBULATORY_CARE_PROVIDER_SITE_OTHER): Payer: BC Managed Care – PPO | Admitting: Psychiatry

## 2018-10-23 ENCOUNTER — Encounter: Payer: Self-pay | Admitting: Psychiatry

## 2018-10-23 DIAGNOSIS — F333 Major depressive disorder, recurrent, severe with psychotic symptoms: Secondary | ICD-10-CM

## 2018-10-23 DIAGNOSIS — R7989 Other specified abnormal findings of blood chemistry: Secondary | ICD-10-CM | POA: Diagnosis not present

## 2018-10-23 DIAGNOSIS — F411 Generalized anxiety disorder: Secondary | ICD-10-CM | POA: Diagnosis not present

## 2018-10-23 MED ORDER — SILDENAFIL CITRATE 100 MG PO TABS: 100.0000 mg | ORAL_TABLET | Freq: Every day | ORAL | 0 refills | Status: DC | PRN

## 2018-10-23 NOTE — Progress Notes (Signed)
Cydney Okhomas E Repetto [id number] 12-29-1970 48 y.o.  Subjective:   Patient ID:  Cydney Okhomas E Guzzetta is a 48 y.o. (DOB 12-29-1970) male.  Chief Complaint:  Chief Complaint  Patient presents with  . Follow-up    Medication Management  . Depression    Medication Management  . Anxiety    Cydney Okhomas E Brougher Follow-up of severe depression with psychosis, altered mental status and anxiety requiring ECT  Att visit Jun 30, 2018.  Was completely in remission on May 4 when seen.  The only change since then has been discontinuing olanzapine. We completed weaned off olanzapine and discontinued it.  At follow-up visit August 06, 2018 he had relapsed with depression and rumination and anxiety.  Venlafaxine was increased to 225 mg daily.  Olanzapine was restarted at 5 mg nightly.  "More normal".  Hopeless resolved.  Still some anxiety and worry but feels more normal and less ruminative.  Quick improvement so probably the olanzapine helped.  Residual depression and anxiety are the same and not insurmountable.  Tears resolved.  Sleep still with   Initial and terminal insomnia.  Some worry over job and finances.  Conc is better.  Caffeine not after 5.  1 diet coke in afternoon.  No RLS.    Almost never using Ativan.  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  Some anxiety over Covid.  Harder to get together with people.    [facility].  Admitted with hallucinations, delusions and memory problems and had accidentally stopped psych meds.  He had a mixture of symptoms that was diagnosed is perhaps delirium related to benzodiazepine withdrawal as well as depression with psychotic features having just had an ECT treatment.  [facility] from December 3 until December 5.    He completed ECT treatment .  Prior psychiatric meds lithium, venlafaxine XR150 , duloxetine 120, fluoxetine 60, Wellbutrin 450, Abilify, Viibryd, Cytomel, Rexulti 2 mg, mirtazapine 45 mg,  olanzapine 10 helpful for anxiety. 2 courses of ECT with the last completed December 2019  Review of Systems:   No tremor.  No weakness.  No tremor.  No confusion.  No headaches.  No balance problems.  No sedation.  Medications: I have reviewed the patient's current medications.  Current Outpatient Medications  Medication Sig Dispense Refill  . hydrALAZINE (APRESOLINE) 50 MG tablet TAKE ONE TABLET BY MOUTH THREE TIMES A DAY 90 tablet 3  . liothyronine (CYTOMEL) 25 MCG tablet TAKE TWO TABLETS BY MOUTH DAILY 60 tablet 2  . lisinopril (ZESTRIL) 40 MG tablet TAKE ONE TABLET BY MOUTH DAILY 90 tablet 1  . LORazepam (ATIVAN) 0.5 MG tablet 1 or 2 as needed for severe anxiety every 8 hours 60 tablet 1  . mirtazapine (REMERON) 45 MG tablet TAKE ONE TABLET BY MOUTH EVERY NIGHT AT BEDTIME 30 tablet 1  . OLANZapine (ZYPREXA) 5 MG tablet TAKE ONE TABLET BY MOUTH EVERY NIGHT AT BEDTIME 30 tablet 2  . sildenafil (VIAGRA) 100 MG tablet Take 1 tablet (100 mg total) by mouth daily as needed. 30 tablet 0  . venlafaxine XR (EFFEXOR-XR) 75 MG 24 hr capsule TAKE THREE CAPSULES BY MOUTH EVERY MORNING WITH BREAKFAST 90 capsule 0   No current facility-administered medications for this visit.      Medication Side Effects: None really noted.  Allergies:  Allergies  Allergen Reactions  . Latex Rash    Past Medical History:  Diagnosis Date  .  Anxiety   . Benzodiazepine withdrawal (HCC)   . Chronic kidney disease    protein builds up  . Depression   . Heart murmur   . Hypertension   . IgA nephropathy   . Sleep apnea    cpap    Family History  Problem Relation Age of Onset  . Testicular cancer Father   . Alcohol abuse Cousin     Social History   Socioeconomic History  . Marital status: Divorced    Spouse name: Not on file  . Number of children: 3  . Years of education: Not on file  . Highest education level: Professional school degree (e.g., MD, DDS, DVM, JD)  Occupational History  . Not  on file  Social Needs  . Financial resource strain: Not hard at all  . Food insecurity    Worry: Never true    Inability: Never true  . Transportation needs    Medical: No    Non-medical: No  Tobacco Use  . Smoking status: Never Smoker  . Smokeless tobacco: Never Used  Substance and Sexual Activity  . Alcohol use: No  . Drug use: Not Currently    Types: Marijuana    Comment: last used 3 to 4 years ago  . Sexual activity: Yes    Birth control/protection: Other-see comments  Lifestyle  . Physical activity    Days per week: 0 days    Minutes per session: 0 min  . Stress: Very much  Relationships  . Social Musician on phone: Not on file    Gets together: Not on file    Attends religious service: Never    Active member of club or organization: No    Attends meetings of clubs or organizations: Never    Relationship status: Divorced  . Intimate partner violence    Fear of current or ex partner: No    Emotionally abused: No    Physically abused: No    Forced sexual activity: No  Other Topics Concern  . Not on file  Social History Narrative  . Not on file    Past Medical History, Surgical history, Social history, and Family history were reviewed and updated as appropriate.   Please see review of systems for further details on the patient's review from today.   Objective:   Physical Exam:  There were no vitals taken for this visit.  Physical Exam Constitutional:      General: He is not in acute distress.    Appearance: He is well-developed. He is obese.  Musculoskeletal:        General: No deformity.  Neurological:     Mental Status: He is alert and oriented to person, place, and time.     Cranial Nerves: No dysarthria.     Coordination: Coordination normal.  Psychiatric:        Attention and Perception: Attention and perception normal. He does not perceive auditory or visual hallucinations.        Mood and Affect: Mood is not anxious or depressed.  Affect is not labile, blunt, angry or inappropriate.        Speech: Speech normal.        Behavior: Behavior normal. Behavior is cooperative.        Thought Content: Thought content normal. Thought content is not paranoid or delusional. Thought content does not include homicidal or suicidal ideation. Thought content does not include homicidal or suicidal plan.  Cognition and Memory: Cognition and memory normal.        Judgment: Judgment normal.     Comments: He has mild residual depression and anxiety but he does not think it is clinically relevant.  He is hypoverbal per usual but answers clearly any questions put forward.  Insight good no evidence for psychosis     Lab Review:     Component Value Date/Time   NA 142 01/09/2018 1513   K 3.9 01/09/2018 1513   CL 105 01/09/2018 1513   CO2 30 01/09/2018 1513   GLUCOSE 94 01/09/2018 1513   BUN 11 01/09/2018 1513   CREATININE 1.25 (H) 01/09/2018 1513   CREATININE 1.82 (H) 11/03/2013 1613   CALCIUM 8.9 01/09/2018 1513   PROT 6.9 05/30/2016 0409   ALBUMIN 3.2 (L) 05/30/2016 0409   AST 21 05/30/2016 0409   ALT 15 (L) 05/30/2016 0409   ALKPHOS 87 05/30/2016 0409   BILITOT 0.9 05/30/2016 0409   GFRNONAA >60 01/09/2018 1513   GFRNONAA 44 (L) 11/03/2013 1613   GFRAA >60 01/09/2018 1513   GFRAA 51 (L) 11/03/2013 1613       Component Value Date/Time   WBC 8.9 01/09/2018 1513   RBC 5.29 01/09/2018 1513   HGB 16.2 01/09/2018 1513   HCT 50.2 01/09/2018 1513   PLT 257 01/09/2018 1513   MCV 94.9 01/09/2018 1513   MCV 90.6 11/03/2013 1613   MCH 30.6 01/09/2018 1513   MCHC 32.3 01/09/2018 1513   RDW 12.0 01/09/2018 1513   LYMPHSABS 1.9 05/30/2016 0409   MONOABS 1.3 (H) 05/30/2016 0409   EOSABS 0.4 05/30/2016 0409   BASOSABS 0.0 05/30/2016 0409    No results found for: POCLITH, LITHIUM   No results found for: PHENYTOIN, PHENOBARB, VALPROATE, CBMZ   .res Assessment: Plan:    Severe episode of recurrent major depressive  disorder, with psychotic features ([place])  Generalized anxiety disorder  Low serum vitamin D   Mr. Espejo has had a severe episode of depression with psychotic features and anxiety and cognitive impairment which has responded to ECT plus medication changes.  He  completed ECT.  He was markedly better and in remission at his last visit on May 4.  At that time we discontinued olanzapine and he had relapsed severely.  So we restarted olanzapine 5 mg daily and increase venlafaxine XR to 225 mg daily.  There is hoped that the increase in venlafaxine may allow discontinuing the olanzapine in the future because of the weight gain and other risks with that medicine.  At this point his symptoms of depression and anxiety are largely resolved with residual symptoms.  He is satisfied with the current outcome and does not want to change any medicines further.  Could consider increasing the olanzapine further but we will defer this.  He is aware that this is an option.  Will later check TSH.  Other option disc is nortriptyline.  Or increasing olanzapine.  Consider start lithium for prophylaxis, consider at next visit or as he comes off Zyprexa.  Would prefer an in office visit to discuss this in more detail but if we are unable to meet in person we will discuss at the follow-up appointment.  No med changes today  Rec counseling for CBT with Dr. Rica Mote  FU 3 mos  Lynder Parents, MD, DFAPA   Future Appointments  Date Time Provider Lucien  10/27/2018  4:00 PM Blanchie Serve, PhD CP-CP None  01/19/2019  1:00 PM Cottle, Allayne Butcher  Brooke Bonito., MD CP-CP None    No orders of the defined types were placed in this encounter.     -------------------------------

## 2018-10-27 ENCOUNTER — Ambulatory Visit: Payer: BC Managed Care – PPO | Admitting: Psychiatry

## 2018-11-09 ENCOUNTER — Other Ambulatory Visit: Payer: Self-pay | Admitting: Psychiatry

## 2018-11-09 DIAGNOSIS — F333 Major depressive disorder, recurrent, severe with psychotic symptoms: Secondary | ICD-10-CM

## 2018-11-11 ENCOUNTER — Ambulatory Visit (INDEPENDENT_AMBULATORY_CARE_PROVIDER_SITE_OTHER): Payer: BC Managed Care – PPO | Admitting: Psychiatry

## 2018-11-11 ENCOUNTER — Other Ambulatory Visit: Payer: Self-pay

## 2018-11-11 DIAGNOSIS — F331 Major depressive disorder, recurrent, moderate: Secondary | ICD-10-CM | POA: Diagnosis not present

## 2018-11-11 DIAGNOSIS — Z87898 Personal history of other specified conditions: Secondary | ICD-10-CM | POA: Diagnosis not present

## 2018-11-11 DIAGNOSIS — R69 Illness, unspecified: Secondary | ICD-10-CM | POA: Diagnosis not present

## 2018-11-11 DIAGNOSIS — F4010 Social phobia, unspecified: Secondary | ICD-10-CM | POA: Diagnosis not present

## 2018-11-11 NOTE — Progress Notes (Signed)
Psychotherapy Progress Note [facility] 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  Feeling down, trapped, a failure.  Says it wasn't happening  when saw Dr. Jennelle Humanottle, but now he is "spinning my wheels at work", and tired of COVID lockdown.  Reviewed prior behavioral agreements, admits he has not made any cold calls since arriving at the firm, including after the previous two sessions when agreed to try.  His only deals are opportunities fed to him by family, which were comfortable enough but not doing the work of braving his anxiety.  Discussed resistance in more detail, learning he just freezes any time he considers trying to call someone he does not know, even when approaching information available to do so.  Broached the possibility that he may be fundamentally too introverted for real estate, but he says he likes the idea, still wants to succeed, still wants to overcome his block.  Tearful, actually trying to hold back while discussing it, feels intensely ashamed and admits severely self-critical thinking.  Supported and cautioned against letting negative thinking hold, addressed self-defeating nature of essentially "yelling" at self, contrasted with how he already knows to talk to his children, challenged to apply the "Reverse [name]" to someone who is also just learning how to do something new and uncomfortable.  Taught breathing for  relaxation, achieved some reduction in anxiety and practice grounding his emotions and shifting his attention off self-demands.  Once calmed, took on visualization of the avoided task, broke down into some component commitments, and successfully visualized just looking at the database, clicking open a prospect.  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  Encouraged to write down what he notices, how he feels doing this, to objectify instead of get lost in anxiety and self-recrimination.  Agrees.  Therapeutic modalities: Cognitive Behavioral Therapy and Solution-Oriented/Positive Psychology  Mental Status/Observations:  Appearance:   Casual     Behavior:  Appropriate and initially frozen  Motor:  Normal  Speech/Language:   Clear and Coherent  Affect:  anxious, tearful; responsive to relaxation and reframing  Mood:  anxious and depressed  Thought process:  intact  Thought content:    Rumination  Sensory/Perceptual disturbances:    WNL  Orientation:  grossly intact  Attention:  Good  Concentration:  Fair  Memory:  WNL  Insight:    Fair  Judgment:   Fair  Impulse Control:  Fair   Risk Assessment: Danger to Self: No Self-injurious Behavior: No Danger to Others: No Physical Aggression / Violence: No Duty to Warn: No Access to Firearms a concern:  No  Assessment of progress:  situational setback(s)  Diagnosis:   ICD-10-CM   1. Social anxiety disorder  F40.10   2. Major depressive disorder, recurrent episode, moderate (HCC)  F33.1   3. Suspected condition  R69    possible autistic spectrum  4. History of alcohol use disorder  Z87.898     Plan:  . Use breathing skill liberally to reduced  anxiety, tension . Self-monitor and challenge intensely self-critical thinking . Take steps visualized in looking up cold calls and trying them out; if successful, try to rack up rejections . Other recommendations/advice as noted above . Continue to utilize previously learned skills ad lib . Maintain medication as prescribed and work faithfully with relevant prescriber(s) if any changes are desired or seem indicated . Call the clinic on-call service, present to ER, or call 911 if any life-threatening psychiatric crisis Return in about 2 weeks (around 11/25/2018) for put on cancellation list if not available within 2 weeks, recommend scheduling 3 ahead.  Blanchie Serve, PhD Luan Moore, PhD LP Clinical Psychologist, [facility] [address] [place], [place] [address] [phone]

## 2018-11-21 ENCOUNTER — Other Ambulatory Visit: Payer: Self-pay | Admitting: Psychiatry

## 2018-11-21 NOTE — Progress Notes (Deleted)
Psychotherapy Progress Note [facility] Luan Moore, PhD LP  Patient ID: Kelly Johnston     MRN: [id number]     Therapy format: {Therapy Types:21967::"Individual psychotherapy"} Date: 09/12/2018     Start: ***:*** Stop: ***:*** Time Spent: *** min Location: in-person   Session narrative (presenting needs, interim history, self-report of stressors and symptoms, applications of prior therapy, status changes, and interventions made in session) ***  Therapeutic modalities: {PSY:[phone]}  Mental Status/Observations:  Appearance:   {PSY:22683}     Behavior:  {PSY:21022743}  Motor:  {PSY:22302}  Speech/Language:   {PSY:22685}  Affect:  {PSY:22687}  Mood:  {PSY:31886}  Thought process:  {PSY:31888}  Thought content:    {PSY:7634011075}  Sensory/Perceptual disturbances:    {[phone]}  Orientation:  {PSY:30297}  Attention:  {PSY:22877}  Concentration:  {[phone]}  Memory:  {PSY:804-218-7967}  Insight:    {[phone]}  Judgment:   {[phone]}  Impulse Control:  {[phone]}   Risk Assessment: Danger to Self: {Risk:22599::"No"} Self-injurious Behavior: {Risk:22599::"No"} Danger to Others: {Risk:22599::"No"} Physical Aggression / Violence: {Risk:22599::"No"} Duty to Warn: {AMYesNo:22526::"No"} Access to Firearms a concern: {AMYesNo:22526::"No"}  Assessment of progress:  {Progress:22147::"progressing"}  Diagnosis:   ICD-10-CM   1. Social anxiety disorder  F40.10   2. Major depressive disorder, recurrent episode, moderate (HCC)  F33.1   3. History of alcohol use disorder  Z87.898     Plan:  . *** . Other recommendations/advice as noted above . Continue to utilize previously learned skills ad lib . Maintain medication as prescribed and work faithfully with relevant prescriber(s) if any changes are desired or seem indicated . Call the clinic on-call service, present to ER, or call 911 if any life-threatening psychiatric crisis . No  follow-ups on file.  Blanchie Serve, PhD Luan Moore, PhD LP Clinical Psychologist, [facility] [address] [place], [place] [address] [phone]

## 2018-11-23 NOTE — Progress Notes (Signed)
Psychotherapy Progress Note [facility] Luan Moore, PhD LP  Patient ID: DASH CARDARELLI     MRN: [id number]     Therapy format: Individual psychotherapy Date: 09/12/2018     Start: 3:06p Stop: 3:51p Time Spent: 45 min Location: in-person   Session narrative (presenting needs, interim history, self-report of stressors and symptoms, applications of prior therapy, status changes, and interventions made in session) PT presents tense, nervous, says he has been stymied trying to make cold calls for work, feels stuck still.  Not clear whether he has consulted coworker as discussed first visit.  Difficulty getting out of bed, does not want to face the day.  Processed anxiety and intimidated thoughts, brainstormed ways to warm up to what feels like such high stakes behavior, establishing that it never hurts to rehearse before trying the real thing.  Discussed roleplaying phone calls with someone supportive who knows something of how they would go.  Identified his buddy Nate as ne supportive friend who could probably be up to it if asked, and agreed to schedule time with him this weekend.  Identified coworker [place], who has shown him the ropes in other ways and is routinely kind, a someone else he could rehearse with if available.  Next opportunity would be Monday, the next work day, so enhanced commitment by [employer] her in session to ask if she would be up to doing that sort of thing and penciling in time to try it.  Meanwhile, oriented to brisk awakening as a way to help regulate mood and break through dread of the day.  Agreed to try an aggressive commitment to just start his physical day -- get weight on his feet, light in his eyes, and at least water down his throat, as assertively as possible -- without and before making any decisions about whether to do anything else in his day.  Briefly practiced this imaginally.  Therapeutic modalities: Cognitive Behavioral Therapy and  Solution-Oriented/Positive Psychology  Mental Status/Observations:  Appearance:   Casual     Behavior:  Appropriate, reserved  Motor:  Normal  Speech/Language:   Normal Rate and somewhat clipped  Affect:  tense  Mood:  anxious  Thought process:  normal and minor blocking, possibly  Thought content:    worries  Sensory/Perceptual disturbances:    WNL  Orientation:  grossly intact  Attention:  Good  Concentration:  Fair  Memory:  WNL  Insight:    Fair  Judgment:   Good  Impulse Control:  Good   Risk Assessment: Danger to Self: No Self-injurious Behavior: No Danger to Others: No Physical Aggression / Violence: No Duty to Warn: No Access to Firearms a concern: No  Assessment of progress:  stabilized  Diagnosis:   ICD-10-CM   1. Social anxiety disorder  F40.10   2. Major depressive disorder, recurrent episode, moderate (HCC)  F33.1   3. History of alcohol use disorder  Z87.898     Plan:  . Follow through on at least one of the roleplay options to show self he can break down the intimidating act of cold-calling and to practice sharing the problem rather than stifling it . Try brisk awakening procedure to break dread and help mood . Other recommendations/advice as noted above . Continue to utilize previously learned skills ad lib . Maintain medication as prescribed and work faithfully with relevant prescriber(s) if any changes are desired or seem indicated . Call the clinic on-call service, present to ER, or call 911 if any  life-threatening psychiatric crisis Return in about 2 weeks (around 09/26/2018) for will call.  Robley Fries, PhD Marliss Czar, PhD LP Clinical Psychologist, [facility] [address], [address] [place], [place] [address] [phone]

## 2018-11-25 NOTE — Progress Notes (Deleted)
Psychotherapy Progress Note [facility] Luan Moore, PhD LP  Patient ID: Kelly Johnston     MRN: [id number]     Therapy format: Individual psychotherapy Date: 11/26/2018     Start: ***:*** Stop: ***:*** Time Spent: *** min Location: in-person   Session narrative (presenting needs, interim history, self-report of stressors and symptoms, applications of prior therapy, status changes, and interventions made in session) ***  Therapeutic modalities: {[phone]}  Mental Status/Observations:  Appearance:   {PSY:22683}     Behavior:  {PSY:21022743}  Motor:  {PSY:22302}  Speech/Language:   {PSY:22685}  Affect:  {PSY:22687}  Mood:  {PSY:31886}  Thought process:  {PSY:31888}  Thought content:    {PSY:240-797-3695}  Sensory/Perceptual disturbances:    {[phone]}  Orientation:  {PSY:30297}  Attention:  {PSY:22877}  Concentration:  {PSY:579-370-4190}  Memory:  {PSY:269 663 1484}  Insight:    {PSY:579-370-4190}  Judgment:   {PSY:579-370-4190}  Impulse Control:  {PSY:579-370-4190}   Risk Assessment: Danger to Self: {Risk:22599::"No"} Self-injurious Behavior: {Risk:22599::"No"} Danger to Others: {Risk:22599::"No"} Physical Aggression / Violence: {Risk:22599::"No"} Duty to Warn: {AMYesNo:22526::"No"} Access to Firearms a concern: {AMYesNo:22526::"No"}  Assessment of progress:  {Progress:22147::"progressing"}  Diagnosis: No diagnosis found.  Plan:  . *** . Other recommendations/advice as noted above . Continue to utilize previously learned skills ad lib . Maintain medication as prescribed and work faithfully with relevant prescriber(s) if any changes are desired or seem indicated . Call the clinic on-call service, present to ER, or call 911 if any life-threatening psychiatric crisis . No follow-ups on file.  Blanchie Serve, PhD Luan Moore, PhD LP Clinical Psychologist, [facility] [address] [place], [place] [address] [phone]

## 2018-11-26 ENCOUNTER — Ambulatory Visit: Payer: BC Managed Care – PPO | Admitting: Psychiatry

## 2018-12-10 ENCOUNTER — Ambulatory Visit (INDEPENDENT_AMBULATORY_CARE_PROVIDER_SITE_OTHER): Payer: BC Managed Care – PPO | Admitting: Psychiatry

## 2018-12-10 ENCOUNTER — Other Ambulatory Visit: Payer: Self-pay

## 2018-12-10 DIAGNOSIS — F331 Major depressive disorder, recurrent, moderate: Secondary | ICD-10-CM

## 2018-12-10 DIAGNOSIS — R69 Illness, unspecified: Secondary | ICD-10-CM

## 2018-12-10 DIAGNOSIS — F4010 Social phobia, unspecified: Secondary | ICD-10-CM

## 2018-12-10 DIAGNOSIS — Z8639 Personal history of other endocrine, nutritional and metabolic disease: Secondary | ICD-10-CM

## 2018-12-10 DIAGNOSIS — Z87898 Personal history of other specified conditions: Secondary | ICD-10-CM

## 2018-12-10 NOTE — Progress Notes (Signed)
Psychotherapy Progress Note [facility] Luan Moore, PhD LP  Patient ID: Kelly Johnston     MRN: [id number]     Therapy format: Individual psychotherapy Date: 12/10/2018     Start: 9:05a Stop: 9:46a Time Spent: 41 min Location: in-person   Session narrative (presenting needs, interim history, self-report of stressors and symptoms, applications of prior therapy, status changes, and interventions made in session) Been anxious and stressed.  Reconsidering real estate, but more comfortable with it after lunching with ex-wife.  Main obstacle is obsessing on negativity.  Good advice from ex-W yesterday to talk to himself the way he would his own kids.  Also encouraged him to just get a job, something.   Feedback never modeled for him by parents, but by high school came to feel alienated, dissatisfied, and in depression cultured a more harsh way of speaking to himself.  Feels parents were maybe too hands-off about things, makes sense that, as an adolescent, he filled in the gap with more absolutist, demanding thinking.    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  Would really like to avoid that if possible.  Discussed possibility of a less-public facing roles.  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  Has not yet taken up homework from last session a month ago, to break down the task of looking at customer lead information, roleplay a cold call, pick out more easily rejectable prospects to initiate a call, "almost" calls, etc.    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  Former [employer] [place] moved on, no longer available.  Open to whatever [employer] can offer, and has set himself to just do it.  Feels more freed to submit to training now, affirmed for it.  Discussed role for therapy, will refocus on retraining automatic thoughts.  Provided copy of 2 workbook chapters on uncovering automatic thoughts and changing cognitive distortions.  Encouraged to read and try the activities in them, with option to look into CBT phone apps that may do similar things.  Therapeutic modalities: Cognitive Behavioral Therapy  Mental Status/Observations:  Appearance:   Casual     Behavior:  Appropriate  Motor:  Normal  Speech/Language:   Clear and Coherent  Affect:  Appropriate and anxious  Mood:  anxious and dysthymic  Thought process:  normal  Thought content:    WNL  Sensory/Perceptual disturbances:    WNL  Orientation:  grossly intact  Attention:  Good  Concentration:  Good  Memory:  WNL  Insight:    Fair  Judgment:   Good  Impulse Control:  Good   Risk Assessment: Danger to Self: No Self-injurious Behavior: No Danger to Others: No Physical Aggression / Violence: No Duty to Warn: No Access to Firearms a concern: No  Assessment of progress:  progressing  Diagnosis:   ICD-10-CM   1. Social anxiety disorder  F40.10   2. Major depressive disorder, recurrent episode, moderate (HCC)  F33.1   3. History of alcohol use disorder  Z87.898  4. r/o Autisitc Spectrum Disorder  R69    per psychiatrist  5. History of vitamin D deficiency  Z86.39     Plan:  . Follow through with on-the-job coaching option . Read and begin working with CBT journaling, materials provided . Other recommendations/advice as noted above . Continue to utilize previously learned skills ad lib . Maintain medication as prescribed and work faithfully with relevant prescriber(s) if any changes are desired or seem indicated . Call the clinic on-call service, present to ER, or call  911 if any life-threatening psychiatric crisis Return in about 2 weeks (around 12/24/2018) for session(s) already scheduled.   Robley Fries, PhD Marliss Czar, PhD LP Clinical Psychologist, [facility] [address], [address] [place], [place] [address] [phone]

## 2018-12-14 ENCOUNTER — Other Ambulatory Visit: Payer: Self-pay | Admitting: Psychiatry

## 2018-12-14 DIAGNOSIS — F333 Major depressive disorder, recurrent, severe with psychotic symptoms: Secondary | ICD-10-CM

## 2018-12-24 ENCOUNTER — Ambulatory Visit: Payer: BC Managed Care – PPO | Admitting: Psychiatry

## 2018-12-25 ENCOUNTER — Other Ambulatory Visit: Payer: Self-pay | Admitting: Psychiatry

## 2019-01-05 ENCOUNTER — Other Ambulatory Visit: Payer: Self-pay | Admitting: Psychiatry

## 2019-01-05 DIAGNOSIS — F333 Major depressive disorder, recurrent, severe with psychotic symptoms: Secondary | ICD-10-CM

## 2019-01-13 ENCOUNTER — Other Ambulatory Visit: Payer: Self-pay | Admitting: Psychiatry

## 2019-01-14 DIAGNOSIS — Z20828 Contact with and (suspected) exposure to other viral communicable diseases: Secondary | ICD-10-CM | POA: Diagnosis not present

## 2019-01-19 ENCOUNTER — Other Ambulatory Visit: Payer: Self-pay

## 2019-01-19 ENCOUNTER — Ambulatory Visit (INDEPENDENT_AMBULATORY_CARE_PROVIDER_SITE_OTHER): Payer: BC Managed Care – PPO | Admitting: Psychiatry

## 2019-01-19 ENCOUNTER — Encounter: Payer: Self-pay | Admitting: Psychiatry

## 2019-01-19 DIAGNOSIS — F331 Major depressive disorder, recurrent, moderate: Secondary | ICD-10-CM | POA: Diagnosis not present

## 2019-01-19 DIAGNOSIS — F4010 Social phobia, unspecified: Secondary | ICD-10-CM | POA: Diagnosis not present

## 2019-01-19 DIAGNOSIS — Z8639 Personal history of other endocrine, nutritional and metabolic disease: Secondary | ICD-10-CM

## 2019-01-19 DIAGNOSIS — F411 Generalized anxiety disorder: Secondary | ICD-10-CM

## 2019-01-19 NOTE — Progress Notes (Signed)
Kelly Johnston [id number] February 24, 1971 48 y.o.  Subjective:   Patient ID:  Kelly Johnston is a 48 y.o. (DOB [birth year]) male.  Chief Complaint:  Chief Complaint  Patient presents with  . Follow-up    Medication Management  . Depression    Medication Management  . Medication Refill    Lorazepam, sildenafil  . Anxiety    [name] Follow-up of severe depression with psychosis, altered mental status and anxiety requiring ECT  Att visit Jun 30, 2018.  Was completely in remission on May 4 when seen.  The only change since then has been discontinuing olanzapine. We completed weaned off olanzapine and discontinued it.  At follow-up visit August 06, 2018 he had relapsed with depression and rumination and anxiety.  Venlafaxine was increased to 225 mg daily.  Olanzapine was restarted at 5 mg nightly.  The last visit in August 2020 his depression and anxiety symptoms had largely resolved.  There were no med changes.  Still OK with depression.  Able to be productive and enjoy things.  Anxiety is episodically worse and Ativan does a little without se.    "More normal".  Hopeless resolved.  Still some anxiety and worry but feels more normal and less ruminative.  Quick improvement so probably the olanzapine helped.  Residual depression and anxiety are the same and not insurmountable.  Tears resolved.  Sleep without   Initial and terminal insomnia.  Some worry over job and finances.  Conc is better.  Caffeine not after 5.  1 diet coke in afternoon.  No RLS.    Almost never using Ativan.  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  Some anxiety over Covid.  Harder to get together with people.    He had a psych hosp at [facility] late 2019.  Admitted with hallucinations, delusions and memory problems and had accidentally stopped psych meds.  He had a mixture of symptoms that was diagnosed is perhaps delirium related to benzodiazepine withdrawal as well as depression with psychotic features  having just had an ECT treatment.  He was hospitalized at [facility] from December 3 until December 5.    He completed ECT treatment .  Prior psychiatric meds lithium, venlafaxine XR150 , duloxetine 120, fluoxetine 60, Wellbutrin 450, Abilify, Viibryd, Cytomel, Rexulti 2 mg, mirtazapine 45 mg, olanzapine 10 helpful for anxiety. 2 courses of ECT with the last completed December 2019  Review of Systems:   No tremor.  No weakness.  No tremor.  No confusion.  No headaches.  No balance problems.  No sedation.  Medications: I have reviewed the patient's current medications.  Current Outpatient Medications  Medication Sig Dispense Refill  . hydrALAZINE (APRESOLINE) 50 MG tablet TAKE ONE TABLET BY MOUTH THREE TIMES A DAY 90 tablet 2  . liothyronine (CYTOMEL) 25 MCG tablet TAKE TWO TABLETS BY MOUTH DAILY 60 tablet 2  . lisinopril (ZESTRIL) 40 MG tablet TAKE ONE TABLET BY MOUTH DAILY 90 tablet 1  . LORazepam (ATIVAN) 0.5 MG tablet 1 or 2 as needed for severe anxiety every 8 hours 60 tablet 1  . mirtazapine (REMERON) 45 MG tablet TAKE ONE TABLET BY MOUTH EVERY NIGHT AT BEDTIME 30 tablet 2  . OLANZapine (ZYPREXA) 5 MG tablet TAKE ONE TABLET BY MOUTH EVERY NIGHT AT BEDTIME 30 tablet 1  . sildenafil (VIAGRA) 100 MG tablet Take 1 tablet (100 mg total) by mouth daily as needed. 30 tablet 0  . venlafaxine XR (EFFEXOR-XR) 75 MG  24 hr capsule TAKE THREE CAPSULES BY MOUTH EVERY MORNING WITH BREAKFAST 90 capsule 2   No current facility-administered medications for this visit.      Medication Side Effects: None really noted.  Allergies:  Allergies  Allergen Reactions  . Latex Rash    Past Medical History:  Diagnosis Date  . Anxiety   . Benzodiazepine withdrawal (HCC)   . Chronic kidney disease    protein builds up  . Depression   . Heart murmur   . Hypertension   . IgA nephropathy   . Sleep apnea    cpap    Family History  Problem Relation Age of Onset  . Testicular cancer Father   .  Alcohol abuse Cousin     Social History   Socioeconomic History  . Marital status: Divorced    Spouse name: Not on file  . Number of children: 3  . Years of education: Not on file  . Highest education level: Professional school degree (e.g., MD, DDS, DVM, JD)  Occupational History  . Not on file  Social Needs  . Financial resource strain: Not hard at all  . Food insecurity    Worry: Never true    Inability: Never true  . Transportation needs    Medical: No    Non-medical: No  Tobacco Use  . Smoking status: Never Smoker  . Smokeless tobacco: Never Used  Substance and Sexual Activity  . Alcohol use: No  . Drug use: Not Currently    Types: Marijuana    Comment: last used 3 to 4 years ago  . Sexual activity: Yes    Birth control/protection: Other-see comments  Lifestyle  . Physical activity    Days per week: 0 days    Minutes per session: 0 min  . Stress: Very much  Relationships  . Social Musicianconnections    Talks on phone: Not on file    Gets together: Not on file    Attends religious service: Never    Active member of club or organization: No    Attends meetings of clubs or organizations: Never    Relationship status: Divorced  . Intimate partner violence    Fear of current or ex partner: No    Emotionally abused: No    Physically abused: No    Forced sexual activity: No  Other Topics Concern  . Not on file  Social History Narrative  . Not on file    Past Medical History, Surgical history, Social history, and Family history were reviewed and updated as appropriate.   Please see review of systems for further details on the patient's review from today.   Objective:   Physical Exam:  There were no vitals taken for this visit.  Physical Exam Constitutional:      General: He is not in acute distress.    Appearance: He is well-developed. He is obese.  Musculoskeletal:        General: No deformity.  Neurological:     Mental Status: He is alert and oriented to  person, place, and time.     Cranial Nerves: No dysarthria.     Coordination: Coordination normal.  Psychiatric:        Attention and Perception: Attention and perception normal. He does not perceive auditory or visual hallucinations.        Mood and Affect: Mood is not depressed. Affect is not labile, blunt, angry or inappropriate.        Speech: Speech normal.  Behavior: Behavior normal. Behavior is cooperative.        Thought Content: Thought content normal. Thought content is not paranoid or delusional. Thought content does not include homicidal or suicidal ideation. Thought content does not include homicidal or suicidal plan.        Cognition and Memory: Cognition and memory normal.        Judgment: Judgment normal.     Comments: He has mild residual depression and anxiety but he does not think it is clinically relevant.  He is hypoverbal per usual but answers clearly any questions put forward.  Insight good no evidence for psychosis     Lab Review:     Component Value Date/Time   NA 142 01/09/2018 1513   K 3.9 01/09/2018 1513   CL 105 01/09/2018 1513   CO2 30 01/09/2018 1513   GLUCOSE 94 01/09/2018 1513   BUN 11 01/09/2018 1513   CREATININE 1.25 (H) 01/09/2018 1513   CREATININE 1.82 (H) 11/03/2013 1613   CALCIUM 8.9 01/09/2018 1513   PROT 6.9 05/30/2016 0409   ALBUMIN 3.2 (L) 05/30/2016 0409   AST 21 05/30/2016 0409   ALT 15 (L) 05/30/2016 0409   ALKPHOS 87 05/30/2016 0409   BILITOT 0.9 05/30/2016 0409   GFRNONAA >60 01/09/2018 1513   GFRNONAA 44 (L) 11/03/2013 1613   GFRAA >60 01/09/2018 1513   GFRAA 51 (L) 11/03/2013 1613       Component Value Date/Time   WBC 8.9 01/09/2018 1513   RBC 5.29 01/09/2018 1513   HGB 16.2 01/09/2018 1513   HCT 50.2 01/09/2018 1513   PLT 257 01/09/2018 1513   MCV 94.9 01/09/2018 1513   MCV 90.6 11/03/2013 1613   MCH 30.6 01/09/2018 1513   MCHC 32.3 01/09/2018 1513   RDW 12.0 01/09/2018 1513   LYMPHSABS 1.9 05/30/2016 0409    MONOABS 1.3 (H) 05/30/2016 0409   EOSABS 0.4 05/30/2016 0409   BASOSABS 0.0 05/30/2016 0409    No results found for: POCLITH, LITHIUM   No results found for: PHENYTOIN, PHENOBARB, VALPROATE, CBMZ   .res Assessment: Plan:    Major depressive disorder, recurrent episode, moderate (HCC)  Social anxiety disorder  History of vitamin D deficiency  Generalized anxiety disorder   Kelly Johnston has had a severe episode of depression with psychotic features and anxiety and cognitive impairment which has responded to ECT plus medication changes.  He  completed ECT.  He was markedly better and in remission at his visit on May 4.  At that time we discontinued olanzapine and he had relapsed severely.  So we restarted olanzapine 5 mg daily and increase venlafaxine XR to 225 mg daily.  There is hoped that the increase in venlafaxine may allow discontinuing the olanzapine in the future because of the weight gain and other risks with that medicine.  At this point his symptoms of depression and anxiety are largely resolved with residual symptoms.  He is satisfied with the current outcome and does not want to change any medicines further.  Could consider increasing the olanzapine further but we will defer this.  He is aware that this is an option.  Will later check TSH.  Other option disc is nortriptyline.  Or increasing olanzapine.  Discussed potential metabolic side effects associated with atypical antipsychotics, as well as potential risk for movement side effects. Advised pt to contact office if movement side effects occur.   Consider start lithium for prophylaxis, consider if he comes off Zyprexa.   No  med changes today per his request and I agree  Rec counseling for CBT with Dr. Farrel Demark  FU 4 mos  Meredith Staggers, MD, DFAPA   No future appointments.  No orders of the defined types were placed in this encounter.     -------------------------------

## 2019-01-20 ENCOUNTER — Other Ambulatory Visit: Payer: Self-pay | Admitting: Psychiatry

## 2019-02-02 ENCOUNTER — Other Ambulatory Visit: Payer: Self-pay | Admitting: Psychiatry

## 2019-02-02 NOTE — Telephone Encounter (Signed)
Hasn't had lorazepam since 01/2018 okay?

## 2019-03-09 ENCOUNTER — Other Ambulatory Visit: Payer: Self-pay | Admitting: Psychiatry

## 2019-03-09 DIAGNOSIS — F333 Major depressive disorder, recurrent, severe with psychotic symptoms: Secondary | ICD-10-CM

## 2019-03-14 ENCOUNTER — Other Ambulatory Visit: Payer: Self-pay | Admitting: Psychiatry

## 2019-03-14 DIAGNOSIS — F333 Major depressive disorder, recurrent, severe with psychotic symptoms: Secondary | ICD-10-CM

## 2019-03-19 ENCOUNTER — Other Ambulatory Visit: Payer: Self-pay | Admitting: Psychiatry

## 2019-03-27 ENCOUNTER — Other Ambulatory Visit: Payer: Self-pay | Admitting: Psychiatry

## 2019-04-14 ENCOUNTER — Other Ambulatory Visit: Payer: Self-pay | Admitting: Psychiatry

## 2019-04-17 ENCOUNTER — Ambulatory Visit: Payer: Self-pay | Attending: Internal Medicine

## 2019-04-17 ENCOUNTER — Other Ambulatory Visit: Payer: Self-pay | Admitting: Psychiatry

## 2019-04-17 DIAGNOSIS — Z23 Encounter for immunization: Secondary | ICD-10-CM | POA: Insufficient documentation

## 2019-04-17 NOTE — Telephone Encounter (Signed)
Are you continuing to write for his lisinopril or pcp?

## 2019-04-17 NOTE — Progress Notes (Signed)
   Covid-19 Vaccination Clinic  Name:  CURBY CARSWELL    MRN: [id number] DOB: [birth year]  04/17/2019  Mr. Menton was observed post Covid-19 immunization for 15 minutes without incidence. He was provided with Vaccine Information Sheet and instruction to access the V-Safe system.   Mr. Neis was instructed to call 911 with any severe reactions post vaccine: Marland Kitchen Difficulty breathing  . Swelling of your face and throat  . A fast heartbeat  . A bad rash all over your body  . Dizziness and weakness    Immunizations Administered    Name Date Dose VIS Date Route   Pfizer COVID-19 Vaccine 04/17/2019  3:36 PM 0.3 mL 02/06/2019 Intramuscular   Manufacturer: [employer], [employer]   Lot: [id number]   NDC: [address]-5

## 2019-05-12 ENCOUNTER — Ambulatory Visit: Payer: Self-pay | Attending: Internal Medicine

## 2019-05-12 DIAGNOSIS — Z23 Encounter for immunization: Secondary | ICD-10-CM

## 2019-05-12 NOTE — Progress Notes (Signed)
   Covid-19 Vaccination Clinic  Name:  Kelly Johnston    MRN: [id number] DOB: Jun 03, 1970  05/12/2019  [name] was observed post Covid-19 immunization for 15 minutes without incident. He was provided with Vaccine Information Sheet and instruction to access the V-Safe system.   [name] was instructed to call 911 with any severe reactions post vaccine: Marland Kitchen Difficulty breathing  . Swelling of face and throat  . A fast heartbeat  . A bad rash all over body  . Dizziness and weakness   Immunizations Administered    Name Date Dose VIS Date Route   Pfizer COVID-19 Vaccine 05/12/2019  3:56 PM 0.3 mL 02/06/2019 Intramuscular   Manufacturer: [employer], [employer]   Lot: [id number]   NDC: [address]-6

## 2019-05-14 ENCOUNTER — Other Ambulatory Visit: Payer: Self-pay | Admitting: Psychiatry

## 2019-05-14 DIAGNOSIS — F333 Major depressive disorder, recurrent, severe with psychotic symptoms: Secondary | ICD-10-CM

## 2019-05-20 ENCOUNTER — Ambulatory Visit (INDEPENDENT_AMBULATORY_CARE_PROVIDER_SITE_OTHER): Payer: 59 | Admitting: Psychiatry

## 2019-05-20 ENCOUNTER — Encounter: Payer: Self-pay | Admitting: Psychiatry

## 2019-05-20 ENCOUNTER — Other Ambulatory Visit: Payer: Self-pay

## 2019-05-20 DIAGNOSIS — F3342 Major depressive disorder, recurrent, in full remission: Secondary | ICD-10-CM | POA: Diagnosis not present

## 2019-05-20 DIAGNOSIS — N522 Drug-induced erectile dysfunction: Secondary | ICD-10-CM | POA: Diagnosis not present

## 2019-05-20 DIAGNOSIS — F4010 Social phobia, unspecified: Secondary | ICD-10-CM

## 2019-05-20 MED ORDER — MIRTAZAPINE 45 MG PO TABS: 45.0000 mg | ORAL_TABLET | Freq: Every day | ORAL | 1 refills | Status: DC

## 2019-05-20 MED ORDER — SILDENAFIL CITRATE 100 MG PO TABS: 100.0000 mg | ORAL_TABLET | Freq: Every day | ORAL | 2 refills | Status: DC | PRN

## 2019-05-20 MED ORDER — OLANZAPINE 5 MG PO TABS: 5.0000 mg | ORAL_TABLET | Freq: Every day | ORAL | 1 refills | Status: DC

## 2019-05-20 MED ORDER — VENLAFAXINE HCL ER 75 MG PO CP24: 225.0000 mg | ORAL_CAPSULE | Freq: Every day | ORAL | 1 refills | Status: DC

## 2019-05-20 NOTE — Progress Notes (Signed)
Kelly Johnston [id number] 1971/02/15 49 y.o.  Subjective:   Patient ID:  Kelly Johnston is a 49 y.o. (DOB 07/20/1970) male.  Chief Complaint:  Chief Complaint  Patient presents with  . Follow-up    Medication Mangaement & anxiety  . Depression    Medication Mangaement    Kelly Johnston Follow-up of severe depression with psychosis, altered mental status and anxiety requiring ECT  Att visit Jun 30, 2018.  Was completely in remission on May 4 when seen.  The only change since then has been discontinuing olanzapine. We completed weaned off olanzapine and discontinued it.  At follow-up visit August 06, 2018 he had relapsed with depression and rumination and anxiety.  Venlafaxine was increased to 225 mg daily.  Olanzapine was restarted at 5 mg nightly.  The last visit in November 2020 his depression and anxiety symptoms had largely resolved.  There were no med changes.  Still OK with depression.  Nothing has gotten worse.  Able to be productive and enjoy things.  Anxiety is managed and Ativan does a little without se.  Pleased with meds without SE.  "More normal".  Hopeless resolved.  Quick improvement so probably the olanzapine helped.  Residual depression and anxiety are the same and not insurmountable.  Tears resolved.  Sleep without   Initial and terminal insomnia.  Some worry over job and finances.  Conc is better.   Caffeine not after 5.  1 diet coke in afternoon.  No RLS.    Almost never using Ativan.  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  He had a psych hosp at [facility] late 2019.  Admitted with hallucinations, delusions and memory problems and had accidentally stopped psych meds.  He had a mixture of symptoms that was diagnosed is perhaps delirium related to benzodiazepine withdrawal as well as depression with psychotic features having just had an ECT treatment.  He was hospitalized at [facility] from December 3 until December 5.    He  completed ECT treatment .  Prior psychiatric meds lithium, venlafaxine XR150 , duloxetine 120, fluoxetine 60, Wellbutrin 450, Abilify, Viibryd, Cytomel, Rexulti 2 mg, mirtazapine 45 mg, olanzapine 10 helpful for anxiety. 2 courses of ECT with the last completed December 2019  Review of Systems:   No tremor.  No weakness.  No tremor.  No confusion.  No headaches.  No balance problems.  No sedation.  Medications: I have reviewed the patient's current medications.  Current Outpatient Medications  Medication Sig Dispense Refill  . hydrALAZINE (APRESOLINE) 50 MG tablet TAKE ONE TABLET BY MOUTH THREE TIMES A DAY 90 tablet 2  . liothyronine (CYTOMEL) 25 MCG tablet TAKE TWO TABLETS BY MOUTH DAILY 60 tablet 1  . lisinopril (ZESTRIL) 40 MG tablet TAKE ONE TABLET BY MOUTH DAILY 90 tablet 0  . LORazepam (ATIVAN) 0.5 MG tablet TAKE ONE TO TWO TABLETS BY MOUTH EVERY 8 HOURS AS NEEDED FOR SEVERE ANXIETY 60 tablet 0  . mirtazapine (REMERON) 45 MG tablet Take 1 tablet (45 mg total) by mouth at bedtime. 90 tablet 1  . OLANZapine (ZYPREXA) 5 MG tablet Take 1 tablet (5 mg total) by mouth at bedtime. 90 tablet 1  . sildenafil (VIAGRA) 100 MG tablet Take 1 tablet (100 mg total) by mouth daily as needed. 30 tablet 2  . venlafaxine XR (EFFEXOR-XR) 75 MG 24 hr capsule Take 3 capsules (225 mg total) by mouth daily with breakfast. 270 capsule 1  No current facility-administered medications for this visit.     Medication Side Effects: None really noted.  Allergies:  Allergies  Allergen Reactions  . Latex Rash    Past Medical History:  Diagnosis Date  . Anxiety   . 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 Chronic kidney disease    protein builds up  . Depression   . Heart murmur   . Hypertension   . IgA nephropathy   . Sleep apnea    cpap    Family History  Problem Relation Age of Onset  . Testicular cancer Father   . Alcohol abuse Cousin     Social History   Socioeconomic History  . Marital  status: Divorced    Spouse name: Not on file  . Number of children: 3  . Years of education: Not on file  . Highest education level: Professional school degree (e.g., MD, DDS, DVM, JD)  Occupational History  . Not on file  Tobacco Use  . Smoking status: Never Smoker  . Smokeless tobacco: Never Used  Substance and Sexual Activity  . Alcohol use: No  . Drug use: Not Currently    Types: Marijuana    Comment: last used 3 to 4 years ago  . Sexual activity: Yes    Birth control/protection: Other-see comments  Other Topics Concern  . Not on file  Social History Narrative  . Not on file   Social Determinants of Health   Financial Resource Strain:   . Difficulty of Paying Living Expenses:   Food Insecurity:   . Worried About Charity fundraiser in the Last Year:   . Arboriculturist in the Last Year:   Transportation Needs:   . Film/video editor (Medical):   Marland Kitchen Lack of Transportation (Non-Medical):   Physical Activity:   . Days of Exercise per Week:   . Minutes of Exercise per Session:   Stress:   . Feeling of Stress :   Social Connections:   . Frequency of Communication with Friends and Family:   . Frequency of Social Gatherings with Friends and Family:   . Attends Religious Services:   . Active Member of Clubs or Organizations:   . Attends Archivist Meetings:   Marland Kitchen Marital Status:   Intimate Partner Violence:   . Fear of Current or Ex-Partner:   . Emotionally Abused:   Marland Kitchen Physically Abused:   . Sexually Abused:     Past Medical History, Surgical history, Social history, and Family history were reviewed and updated as appropriate.   Please see review of systems for further details on the patient's review from today.   Objective:   Physical Exam:  There were no vitals taken for this visit.  Physical Exam Constitutional:      General: He is not in acute distress.    Appearance: He is well-developed. He is obese.  Musculoskeletal:        General: No  deformity.  Neurological:     Mental Status: He is alert and oriented to person, place, and time.     Cranial Nerves: No dysarthria.     Coordination: Coordination normal.  Psychiatric:        Attention and Perception: Attention and perception normal. He does not perceive auditory or visual hallucinations.        Mood and Affect: Mood is not depressed. Affect is not labile, blunt, angry or inappropriate.        Speech: Speech normal.  Behavior: Behavior normal. Behavior is cooperative.        Thought Content: Thought content normal. Thought content is not paranoid or delusional. Thought content does not include homicidal or suicidal ideation. Thought content does not include homicidal or suicidal plan.        Cognition and Memory: Cognition and memory normal.        Judgment: Judgment normal.     Comments:   He is hypoverbal per usual but answers clearly any questions put forward.  Insight good no evidence for psychosis     Lab Review:     Component Value Date/Time   NA 142 01/09/2018 1513   K 3.9 01/09/2018 1513   CL 105 01/09/2018 1513   CO2 30 01/09/2018 1513   GLUCOSE 94 01/09/2018 1513   BUN 11 01/09/2018 1513   CREATININE 1.25 (H) 01/09/2018 1513   CREATININE 1.82 (H) 11/03/2013 1613   CALCIUM 8.9 01/09/2018 1513   PROT 6.9 05/30/2016 0409   ALBUMIN 3.2 (L) 05/30/2016 0409   AST 21 05/30/2016 0409   ALT 15 (L) 05/30/2016 0409   ALKPHOS 87 05/30/2016 0409   BILITOT 0.9 05/30/2016 0409   GFRNONAA >60 01/09/2018 1513   GFRNONAA 44 (L) 11/03/2013 1613   GFRAA >60 01/09/2018 1513   GFRAA 51 (L) 11/03/2013 1613       Component Value Date/Time   WBC 8.9 01/09/2018 1513   RBC 5.29 01/09/2018 1513   HGB 16.2 01/09/2018 1513   HCT 50.2 01/09/2018 1513   PLT 257 01/09/2018 1513   MCV 94.9 01/09/2018 1513   MCV 90.6 11/03/2013 1613   MCH 30.6 01/09/2018 1513   MCHC 32.3 01/09/2018 1513   RDW 12.0 01/09/2018 1513   LYMPHSABS 1.9 05/30/2016 0409   MONOABS 1.3 (H)  05/30/2016 0409   EOSABS 0.4 05/30/2016 0409   BASOSABS 0.0 05/30/2016 0409    No results found for: POCLITH, LITHIUM   No results found for: PHENYTOIN, PHENOBARB, VALPROATE, CBMZ   .res Assessment: Plan:    Major depression, recurrent, full remission (HCC) - Plan: mirtazapine (REMERON) 45 MG tablet, OLANZapine (ZYPREXA) 5 MG tablet, venlafaxine XR (EFFEXOR-XR) 75 MG 24 hr capsule  Social anxiety disorder - Plan: venlafaxine XR (EFFEXOR-XR) 75 MG 24 hr capsule  Drug-induced erectile dysfunction - Plan: sildenafil (VIAGRA) 100 MG tablet   Kelly Johnston has had a severe episode of depression with psychotic features and anxiety and cognitive impairment which has responded to ECT plus medication changes.  He  completed ECT.  He was markedly better and in remission at his visit on May 4.  At that time we discontinued olanzapine and he had relapsed severely.  So we restarted olanzapine 5 mg daily and increase venlafaxine XR to 225 mg daily.  There is hoped that the increase in venlafaxine may allow discontinuing the olanzapine in the future because of the weight gain and other risks with that medicine.  At this point his symptoms of depression and anxiety are largely resolved   Discussed potential metabolic side effects associated with atypical antipsychotics, as well as potential risk for movement side effects. Advised pt to contact office if movement side effects occur.   Consider start lithium for prophylaxis, consider if he comes off Zyprexa.   No med changes today per his request and I agree  FU 6 mos  Meredith Staggers, MD, DFAPA   Future Appointments  Date Time Provider Department Center  11/19/2019  1:00 PM Cottle, Steva Ready., MD [facility] None  No orders of the defined types were placed in this encounter.     -------------------------------

## 2019-05-23 ENCOUNTER — Other Ambulatory Visit: Payer: Self-pay | Admitting: Psychiatry

## 2019-05-23 DIAGNOSIS — F3342 Major depressive disorder, recurrent, in full remission: Secondary | ICD-10-CM

## 2019-06-02 ENCOUNTER — Other Ambulatory Visit: Payer: Self-pay | Admitting: Psychiatry

## 2019-07-04 ENCOUNTER — Other Ambulatory Visit: Payer: Self-pay | Admitting: Psychiatry

## 2019-07-13 ENCOUNTER — Other Ambulatory Visit: Payer: Self-pay | Admitting: Psychiatry

## 2019-08-03 ENCOUNTER — Other Ambulatory Visit: Payer: Self-pay

## 2019-08-03 ENCOUNTER — Other Ambulatory Visit: Payer: Self-pay | Admitting: Psychiatry

## 2019-08-03 MED ORDER — LIOTHYRONINE SODIUM 25 MCG PO TABS: 50.0000 ug | ORAL_TABLET | Freq: Every day | ORAL | 1 refills | Status: DC

## 2019-08-21 ENCOUNTER — Other Ambulatory Visit: Payer: Self-pay | Admitting: Psychiatry

## 2019-08-21 DIAGNOSIS — N522 Drug-induced erectile dysfunction: Secondary | ICD-10-CM

## 2019-08-21 NOTE — Telephone Encounter (Signed)
Not sure insurance covers for 90 day

## 2019-10-30 ENCOUNTER — Other Ambulatory Visit: Payer: Self-pay | Admitting: Psychiatry

## 2019-11-04 ENCOUNTER — Other Ambulatory Visit: Payer: Self-pay | Admitting: Psychiatry

## 2019-11-18 ENCOUNTER — Other Ambulatory Visit: Payer: Self-pay | Admitting: Psychiatry

## 2019-11-18 DIAGNOSIS — F3342 Major depressive disorder, recurrent, in full remission: Secondary | ICD-10-CM

## 2019-11-18 DIAGNOSIS — F4010 Social phobia, unspecified: Secondary | ICD-10-CM

## 2019-11-18 NOTE — Telephone Encounter (Signed)
Review.

## 2019-11-19 ENCOUNTER — Other Ambulatory Visit: Payer: Self-pay

## 2019-11-19 ENCOUNTER — Ambulatory Visit (INDEPENDENT_AMBULATORY_CARE_PROVIDER_SITE_OTHER): Payer: 59 | Admitting: Psychiatry

## 2019-11-19 ENCOUNTER — Encounter: Payer: Self-pay | Admitting: Psychiatry

## 2019-11-19 DIAGNOSIS — N522 Drug-induced erectile dysfunction: Secondary | ICD-10-CM | POA: Diagnosis not present

## 2019-11-19 DIAGNOSIS — F4010 Social phobia, unspecified: Secondary | ICD-10-CM | POA: Diagnosis not present

## 2019-11-19 DIAGNOSIS — F3342 Major depressive disorder, recurrent, in full remission: Secondary | ICD-10-CM

## 2019-11-19 DIAGNOSIS — R7989 Other specified abnormal findings of blood chemistry: Secondary | ICD-10-CM

## 2019-11-19 DIAGNOSIS — F411 Generalized anxiety disorder: Secondary | ICD-10-CM | POA: Diagnosis not present

## 2019-11-19 MED ORDER — SILDENAFIL CITRATE 100 MG PO TABS: 100.0000 mg | ORAL_TABLET | Freq: Every day | ORAL | 1 refills | Status: DC | PRN

## 2019-11-19 MED ORDER — OLANZAPINE 5 MG PO TABS: 5.0000 mg | ORAL_TABLET | Freq: Every day | ORAL | 1 refills | Status: DC

## 2019-11-19 MED ORDER — MIRTAZAPINE 45 MG PO TABS: 45.0000 mg | ORAL_TABLET | Freq: Every day | ORAL | 1 refills | Status: DC

## 2019-11-19 MED ORDER — VENLAFAXINE HCL ER 75 MG PO CP24: 225.0000 mg | ORAL_CAPSULE | Freq: Every day | ORAL | 1 refills | Status: DC

## 2019-11-19 NOTE — Progress Notes (Signed)
Kelly Johnston [id number] 09-Jun-1970 49 y.o.  Subjective:   Patient ID:  Kelly Johnston is a 49 y.o. (DOB [birth year]) male.  Chief Complaint:  Chief Complaint  Patient presents with  . Follow-up    Medication Management  . Depression    Medication Management    MAYAN KLOEPFER Follow-up of severe depression with psychosis, altered mental status and anxiety requiring ECT  Att visit Jun 30, 2018.  Was completely in remission on May 4 when seen.  The only change since then has been discontinuing olanzapine. We completed weaned off olanzapine and discontinued it.  At follow-up visit August 06, 2018 he had relapsed with depression and rumination and anxiety.  Venlafaxine was increased to 225 mg daily.  Olanzapine was restarted at 5 mg nightly.  visit in November 2020 his depression and anxiety symptoms had largely resolved.  There were no med changes.  05/20/19 appt with the following noted: Still OK with depression.  Nothing has gotten worse.  Able to be productive and enjoy things.  Anxiety is managed and Ativan does a little without se.  Pleased with meds without SE. "More normal".  Hopeless resolved.  Quick improvement so probably the olanzapine helped.  Residual depression and anxiety are the same and not insurmountable.  Tears resolved.  Sleep without   Initial and terminal insomnia.  Some worry over job and finances.  Conc is better.  Caffeine not after 5.  1 diet coke in afternoon.  No RLS.   Almost never using Ativan. 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 Plan: no med changes  11/19/2019 appointment with the following noted: Good. No issues with depression.  Anxiety OK. No Ativan used Patient reports stable mood and denies depressed or irritable moods.  Patient denies any recent difficulty with anxiety.  Patient denies difficulty with sleep initiation or maintenance. Denies appetite disturbance.  Patient reports that energy and motivation have been  good.  Patient denies any difficulty with concentration.  Patient denies any suicidal ideation. No SE concerns.  He had a psych hosp at [facility] late 2019.  Admitted with hallucinations, delusions and memory problems and had accidentally stopped psych meds.  He had a mixture of symptoms that was diagnosed is perhaps delirium related to benzodiazepine withdrawal as well as depression with psychotic features having just had an ECT treatment.  He was hospitalized at [facility] from December 3 until December 5.    He completed ECT treatment .  Prior psychiatric meds lithium, venlafaxine XR150 , duloxetine 120, fluoxetine 60, Wellbutrin 450, Abilify, Viibryd, Cytomel, Rexulti 2 mg, mirtazapine 45 mg, olanzapine 10 helpful for anxiety. 2 courses of ECT with the last completed December 2019  Review of Systems:   No tremor.  No weakness. Started losing weight at gym.  No tremor.  No confusion.  No headaches.  No balance problems.  No sedation.  Medications: I have reviewed the patient's current medications.  Current Outpatient Medications  Medication Sig Dispense Refill  . hydrALAZINE (APRESOLINE) 50 MG tablet TAKE ONE TABLET BY MOUTH THREE TIMES A DAY 90 tablet 2  . liothyronine (CYTOMEL) 25 MCG tablet Take 2 tablets (50 mcg total) by mouth daily. 180 tablet 1  . lisinopril (ZESTRIL) 40 MG tablet TAKE ONE TABLET BY MOUTH DAILY 90 tablet 0  . mirtazapine (REMERON) 45 MG tablet Take 1 tablet (45 mg total) by mouth at bedtime. 90 tablet 1  . OLANZapine (ZYPREXA) 5 MG tablet  Take 1 tablet (5 mg total) by mouth at bedtime. 90 tablet 1  . sildenafil (VIAGRA) 100 MG tablet Take 1 tablet (100 mg total) by mouth daily as needed. 90 tablet 1  . venlafaxine XR (EFFEXOR-XR) 75 MG 24 hr capsule Take 3 capsules (225 mg total) by mouth daily with breakfast. 270 capsule 1   No current facility-administered medications for this visit.     Medication Side Effects: None really noted.  Allergies:  Allergies   Allergen Reactions  . Latex Rash    Past Medical History:  Diagnosis Date  . Anxiety   . Benzodiazepine withdrawal (HCC)   . Chronic kidney disease    protein builds up  . Depression   . Heart murmur   . Hypertension   . IgA nephropathy   . Sleep apnea    cpap    Family History  Problem Relation Age of Onset  . Testicular cancer Father   . Alcohol abuse Cousin     Social History   Socioeconomic History  . Marital status: Divorced    Spouse name: Not on file  . Number of children: 3  . Years of education: Not on file  . Highest education level: Professional school degree (e.g., MD, DDS, DVM, JD)  Occupational History  . Not on file  Tobacco Use  . Smoking status: Never Smoker  . Smokeless tobacco: Never Used  Vaping Use  . Vaping Use: Never used  Substance and Sexual Activity  . Alcohol use: No  . Drug use: Not Currently    Types: Marijuana    Comment: last used 3 to 4 years ago  . Sexual activity: Yes    Birth control/protection: Other-see comments  Other Topics Concern  . Not on file  Social History Narrative  . Not on file   Social Determinants of Health   Financial Resource Strain:   . Difficulty of Paying Living Expenses: Not on file  Food Insecurity:   . Worried About Programme researcher, broadcasting/film/video in the Last Year: Not on file  . Ran Out of Food in the Last Year: Not on file  Transportation Needs:   . Lack of Transportation (Medical): Not on file  . Lack of Transportation (Non-Medical): Not on file  Physical Activity:   . Days of Exercise per Week: Not on file  . Minutes of Exercise per Session: Not on file  Stress:   . Feeling of Stress : Not on file  Social Connections:   . Frequency of Communication with Friends and Family: Not on file  . Frequency of Social Gatherings with Friends and Family: Not on file  . Attends Religious Services: Not on file  . Active Member of Clubs or Organizations: Not on file  . Attends Banker Meetings:  Not on file  . Marital Status: Not on file  Intimate Partner Violence:   . Fear of Current or Ex-Partner: Not on file  . Emotionally Abused: Not on file  . Physically Abused: Not on file  . Sexually Abused: Not on file    Past Medical History, Surgical history, Social history, and Family history were reviewed and updated as appropriate.   Please see review of systems for further details on the patient's review from today.   Objective:   Physical Exam:  There were no vitals taken for this visit.  Physical Exam Constitutional:      General: He is not in acute distress.    Appearance: He is well-developed. He  is obese.  Musculoskeletal:        General: No deformity.  Neurological:     Mental Status: He is alert and oriented to person, place, and time.     Cranial Nerves: No dysarthria.     Coordination: Coordination normal.  Psychiatric:        Attention and Perception: Attention and perception normal. He does not perceive auditory or visual hallucinations.        Mood and Affect: Mood is not anxious or depressed. Affect is not labile, blunt, angry or inappropriate.        Speech: Speech normal.        Behavior: Behavior normal. Behavior is cooperative.        Thought Content: Thought content normal. Thought content is not paranoid or delusional. Thought content does not include homicidal or suicidal ideation. Thought content does not include homicidal or suicidal plan.        Cognition and Memory: Cognition and memory normal.        Judgment: Judgment normal.     Comments:   He is hypoverbal per usual but answers clearly any questions put forward.  Insight good no evidence for psychosis     Lab Review:     Component Value Date/Time   NA 142 01/09/2018 1513   K 3.9 01/09/2018 1513   CL 105 01/09/2018 1513   CO2 30 01/09/2018 1513   GLUCOSE 94 01/09/2018 1513   BUN 11 01/09/2018 1513   CREATININE 1.25 (H) 01/09/2018 1513   CREATININE 1.82 (H) 11/03/2013 1613   CALCIUM  8.9 01/09/2018 1513   PROT 6.9 05/30/2016 0409   ALBUMIN 3.2 (L) 05/30/2016 0409   AST 21 05/30/2016 0409   ALT 15 (L) 05/30/2016 0409   ALKPHOS 87 05/30/2016 0409   BILITOT 0.9 05/30/2016 0409   GFRNONAA >60 01/09/2018 1513   GFRNONAA 44 (L) 11/03/2013 1613   GFRAA >60 01/09/2018 1513   GFRAA 51 (L) 11/03/2013 1613       Component Value Date/Time   WBC 8.9 01/09/2018 1513   RBC 5.29 01/09/2018 1513   HGB 16.2 01/09/2018 1513   HCT 50.2 01/09/2018 1513   PLT 257 01/09/2018 1513   MCV 94.9 01/09/2018 1513   MCV 90.6 11/03/2013 1613   MCH 30.6 01/09/2018 1513   MCHC 32.3 01/09/2018 1513   RDW 12.0 01/09/2018 1513   LYMPHSABS 1.9 05/30/2016 0409   MONOABS 1.3 (H) 05/30/2016 0409   EOSABS 0.4 05/30/2016 0409   BASOSABS 0.0 05/30/2016 0409    No results found for: POCLITH, LITHIUM   No results found for: PHENYTOIN, PHENOBARB, VALPROATE, CBMZ   .res Assessment: Plan:    Major depression, recurrent, full remission (HCC) - Plan: mirtazapine (REMERON) 45 MG tablet, OLANZapine (ZYPREXA) 5 MG tablet, venlafaxine XR (EFFEXOR-XR) 75 MG 24 hr capsule  Social anxiety disorder - Plan: venlafaxine XR (EFFEXOR-XR) 75 MG 24 hr capsule  Drug-induced erectile dysfunction - Plan: sildenafil (VIAGRA) 100 MG tablet  Generalized anxiety disorder  Low serum vitamin D   Mr. Cowens has had a severe episode of depression with psychotic features and anxiety and cognitive impairment which has responded to ECT plus medication changes.  He  completed ECT.  He was markedly better and in remission at his visit on May 4.  At that time we discontinued olanzapine and he had relapsed severely.  So we restarted olanzapine 5 mg daily and increase venlafaxine XR to 225 mg daily.  SX resolved again with olanzapine  added. At this point his symptoms of depression and anxiety are  resolved   Tolerating meds.  Discussed potential metabolic side effects associated with atypical antipsychotics, as well as  potential risk for movement side effects. Advised pt to contact office if movement side effects occur.   Consider start lithium for prophylaxis, consider if he comes off Zyprexa. Disc risk of stopping Zyprexa. No SE.  No med changes today per his request and I agree  FU 6 mos  Meredith Staggersarey Cottle, MD, DFAPA   No future appointments.  No orders of the defined types were placed in this encounter.     -------------------------------

## 2020-01-27 ENCOUNTER — Other Ambulatory Visit: Payer: Self-pay | Admitting: Psychiatry

## 2020-04-27 ENCOUNTER — Other Ambulatory Visit: Payer: Self-pay | Admitting: Psychiatry

## 2020-05-16 ENCOUNTER — Ambulatory Visit: Payer: 59 | Admitting: Psychiatry

## 2020-05-23 ENCOUNTER — Ambulatory Visit (INDEPENDENT_AMBULATORY_CARE_PROVIDER_SITE_OTHER): Payer: 59 | Admitting: Psychiatry

## 2020-05-23 ENCOUNTER — Other Ambulatory Visit: Payer: Self-pay

## 2020-05-23 ENCOUNTER — Encounter: Payer: Self-pay | Admitting: Psychiatry

## 2020-05-23 DIAGNOSIS — F411 Generalized anxiety disorder: Secondary | ICD-10-CM

## 2020-05-23 DIAGNOSIS — R7989 Other specified abnormal findings of blood chemistry: Secondary | ICD-10-CM

## 2020-05-23 DIAGNOSIS — N522 Drug-induced erectile dysfunction: Secondary | ICD-10-CM

## 2020-05-23 DIAGNOSIS — F4010 Social phobia, unspecified: Secondary | ICD-10-CM

## 2020-05-23 DIAGNOSIS — F3342 Major depressive disorder, recurrent, in full remission: Secondary | ICD-10-CM

## 2020-05-23 NOTE — Progress Notes (Signed)
Kelly Johnston [id number] 02-21-1971 50 y.o.  Subjective:   Patient ID:  Kelly Johnston is a 50 y.o. (DOB 31-Jul-1970) male.  Chief Complaint:  Chief Complaint  Patient presents with  . Follow-up  . Other    Weight gain    Kelly Johnston Follow-up of severe depression with psychosis, altered mental status and anxiety requiring ECT  Att visit Jun 30, 2018.  Was completely in remission on May 4 when seen.  The only change since then has been discontinuing olanzapine. We completed weaned off olanzapine and discontinued it.  At follow-up visit August 06, 2018 he had relapsed with depression and rumination and anxiety.  Venlafaxine was increased to 225 mg daily.  Olanzapine was restarted at 5 mg nightly.  visit in November 2020 his depression and anxiety symptoms had largely resolved.  There were no med changes.  05/20/19 appt with the following noted: Still OK with depression.  Nothing has gotten worse.  Able to be productive and enjoy things.  Anxiety is managed and Ativan does a little without se.  Pleased with meds without SE. "More normal".  Hopeless resolved.  Quick improvement so probably the olanzapine helped.  Residual depression and anxiety are the same and not insurmountable.  Tears resolved.  Sleep without   Initial and terminal insomnia.  Some worry over job and finances.  Conc is better.  Caffeine not after 5.  1 diet coke in afternoon.  No RLS.   Almost never using Ativan. 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 Plan: no med changes  11/19/2019 appointment with the following noted: Good. No issues with depression.  Anxiety OK. No Ativan used Patient reports stable mood and denies depressed or irritable moods.  Patient denies any recent difficulty with anxiety.  Patient denies difficulty with sleep initiation or maintenance. Denies appetite disturbance.  Patient reports that energy and motivation have been good.  Patient denies any difficulty  with concentration.  Patient denies any suicidal ideation. No SE concerns. Plan no med changes  05/23/2020 appointment with the following noted:  doing good without relapses and satisfied with meds. Happier with career choice.  Makes a difference with mental health.  Doing real estate appraisals.  Staying busy.  Anxiety goodl Patient reports stable mood and denies depressed or irritable moods.  Patient denies any recent difficulty with anxiety.  Patient denies difficulty with sleep initiation or maintenance. Denies appetite disturbance.  Patient reports that energy and motivation have been good.  Patient denies any difficulty with concentration.  Patient denies any suicidal ideation. Wonders if binge eating disorder.  Uncontrollable urge even if not hungry.  Goes to extreme.  Wonders if started when depressed.  He had a psych hosp at [facility] late 2019.  Admitted with hallucinations, delusions and memory problems and had accidentally stopped psych meds.  He had a mixture of symptoms that was diagnosed is perhaps delirium related to benzodiazepine withdrawal as well as depression with psychotic features having just had an ECT treatment.  He was hospitalized at [facility] from December 3 until December 5.    He completed ECT treatment .  Prior psychiatric meds lithium, venlafaxine XR150 , duloxetine 120, fluoxetine 60, Wellbutrin 450, Abilify, Viibryd, Cytomel, Rexulti 2 mg, mirtazapine 45 mg, olanzapine 10 helpful for anxiety. 2 courses of ECT with the last completed December 2019  Review of Systems:   No tremor.  No weakness. Started losing weight at gym. Working on [employer].  No tremor.  No confusion.  No headaches.  No balance problems.  No sedation.  Medications: I have reviewed the patient's current medications.  Current Outpatient Medications  Medication Sig Dispense Refill  . hydrALAZINE (APRESOLINE) 50 MG tablet TAKE ONE TABLET BY MOUTH THREE TIMES A DAY 90 tablet 0  . liothyronine  (CYTOMEL) 25 MCG tablet Take 2 tablets (50 mcg total) by mouth daily. 180 tablet 1  . lisinopril (ZESTRIL) 40 MG tablet TAKE ONE TABLET BY MOUTH DAILY 90 tablet 1  . mirtazapine (REMERON) 45 MG tablet Take 1 tablet (45 mg total) by mouth at bedtime. 90 tablet 1  . OLANZapine (ZYPREXA) 5 MG tablet Take 1 tablet (5 mg total) by mouth at bedtime. 90 tablet 1  . sildenafil (VIAGRA) 100 MG tablet Take 1 tablet (100 mg total) by mouth daily as needed. 90 tablet 1  . venlafaxine XR (EFFEXOR-XR) 75 MG 24 hr capsule Take 3 capsules (225 mg total) by mouth daily with breakfast. 270 capsule 1   No current facility-administered medications for this visit.     Medication Side Effects: None really noted.  Allergies:  Allergies  Allergen Reactions  . Latex Rash    Past Medical History:  Diagnosis Date  . Anxiety   . Benzodiazepine withdrawal (HCC)   . Chronic kidney disease    protein builds up  . Depression   . Heart murmur   . Hypertension   . IgA nephropathy   . Sleep apnea    cpap    Family History  Problem Relation Age of Onset  . Testicular cancer Father   . Alcohol abuse Cousin     Social History   Socioeconomic History  . Marital status: Divorced    Spouse name: Not on file  . Number of children: 3  . Years of education: Not on file  . Highest education level: Professional school degree (e.g., MD, DDS, DVM, JD)  Occupational History  . Not on file  Tobacco Use  . Smoking status: Never Smoker  . Smokeless tobacco: Never Used  Vaping Use  . Vaping Use: Never used  Substance and Sexual Activity  . Alcohol use: No  . Drug use: Not Currently    Types: Marijuana    Comment: last used 3 to 4 years ago  . Sexual activity: Yes    Birth control/protection: Other-see comments  Other Topics Concern  . Not on file  Social History Narrative  . Not on file   Social Determinants of Health   Financial Resource Strain: Not on file  Food Insecurity: Not on file   Transportation Needs: Not on file  Physical Activity: Not on file  Stress: Not on file  Social Connections: Not on file  Intimate Partner Violence: Not on file    Past Medical History, Surgical history, Social history, and Family history were reviewed and updated as appropriate.   Please see review of systems for further details on the patient's review from today.   Objective:   Physical Exam:  There were no vitals taken for this visit.  Physical Exam Constitutional:      General: He is not in acute distress.    Appearance: He is well-developed. He is obese.  Musculoskeletal:        General: No deformity.  Neurological:     Mental Status: He is alert and oriented to person, place, and time.     Cranial Nerves: No dysarthria.     Coordination: Coordination normal.  Psychiatric:  Attention and Perception: Attention and perception normal. He does not perceive auditory or visual hallucinations.        Mood and Affect: Mood is not anxious or depressed. Affect is not labile, blunt, angry or inappropriate.        Speech: Speech normal.        Behavior: Behavior normal. Behavior is cooperative.        Thought Content: Thought content normal. Thought content is not paranoid or delusional. Thought content does not include homicidal or suicidal ideation. Thought content does not include homicidal or suicidal plan.        Cognition and Memory: Cognition and memory normal.        Judgment: Judgment normal.     Comments: Less hypoverval Insight good no evidence for psychosis     Lab Review:     Component Value Date/Time   NA 142 01/09/2018 1513   K 3.9 01/09/2018 1513   CL 105 01/09/2018 1513   CO2 30 01/09/2018 1513   GLUCOSE 94 01/09/2018 1513   BUN 11 01/09/2018 1513   CREATININE 1.25 (H) 01/09/2018 1513   CREATININE 1.82 (H) 11/03/2013 1613   CALCIUM 8.9 01/09/2018 1513   PROT 6.9 05/30/2016 0409   ALBUMIN 3.2 (L) 05/30/2016 0409   AST 21 05/30/2016 0409   ALT 15  (L) 05/30/2016 0409   ALKPHOS 87 05/30/2016 0409   BILITOT 0.9 05/30/2016 0409   GFRNONAA >60 01/09/2018 1513   GFRNONAA 44 (L) 11/03/2013 1613   GFRAA >60 01/09/2018 1513   GFRAA 51 (L) 11/03/2013 1613       Component Value Date/Time   WBC 8.9 01/09/2018 1513   RBC 5.29 01/09/2018 1513   HGB 16.2 01/09/2018 1513   HCT 50.2 01/09/2018 1513   PLT 257 01/09/2018 1513   MCV 94.9 01/09/2018 1513   MCV 90.6 11/03/2013 1613   MCH 30.6 01/09/2018 1513   MCHC 32.3 01/09/2018 1513   RDW 12.0 01/09/2018 1513   LYMPHSABS 1.9 05/30/2016 0409   MONOABS 1.3 (H) 05/30/2016 0409   EOSABS 0.4 05/30/2016 0409   BASOSABS 0.0 05/30/2016 0409    No results found for: POCLITH, LITHIUM   No results found for: PHENYTOIN, PHENOBARB, VALPROATE, CBMZ   .res Assessment: Plan:    Major depression, recurrent, full remission (HCC)  Social anxiety disorder  Drug-induced erectile dysfunction  Generalized anxiety disorder  Low serum vitamin D   Kelly Johnston has had a severe episode of depression with psychotic features and anxiety and cognitive impairment which has responded to ECT plus medication changes.  He  completed ECT.  He was markedly better and in remission at his visit on May 4.  At that time we discontinued olanzapine and he had relapsed severely.  So we restarted olanzapine 5 mg daily and increase venlafaxine XR to 225 mg daily.  SX resolved again with olanzapine added. At this point his symptoms of depression and anxiety are  resolved   Tolerating meds.  Discussed potential metabolic side effects associated with atypical antipsychotics, as well as potential risk for movement side effects. Advised pt to contact office if movement side effects occur.   Consider start lithium for prophylaxis, consider if he comes off Zyprexa. Disc risk of stopping Zyprexa. No SE.  Consider Ozempic for weight loss.    Trial Libalvi in place of olanzapine  Option Lyanne CoEd Lurey for EDO.  Not 100% sure if he  actually has an eating disorder versus the effect of olanzapine except that  he says he will eat even when he is not hungry.  He will consider therapy.  FU 6 mos  Meredith Staggers, MD, DFAPA   Future Appointments  Date Time Provider Department Center  11/23/2020  1:30 PM Cottle, Steva Ready., MD [facility] None    No orders of the defined types were placed in this encounter.     -------------------------------

## 2020-05-23 NOTE — Patient Instructions (Addendum)
Ed Lurey PHD  Ozempic/ DJSHFW for weight loss.  Disc with PCP

## 2020-05-24 ENCOUNTER — Other Ambulatory Visit (HOSPITAL_BASED_OUTPATIENT_CLINIC_OR_DEPARTMENT_OTHER)
Admission: RE | Admit: 2020-05-24 | Discharge: 2020-05-24 | Disposition: A | Discharge status: Home / Self Care | Payer: 59 | Source: Ambulatory Visit | Attending: Family Medicine | Admitting: Family Medicine

## 2020-05-24 ENCOUNTER — Ambulatory Visit (INDEPENDENT_AMBULATORY_CARE_PROVIDER_SITE_OTHER): Payer: 59 | Admitting: Family Medicine

## 2020-05-24 ENCOUNTER — Encounter (HOSPITAL_BASED_OUTPATIENT_CLINIC_OR_DEPARTMENT_OTHER): Payer: Self-pay | Admitting: Family Medicine

## 2020-05-24 VITALS — BP 112/74 | HR 105 | Ht 77.0 in | Wt 315.0 lb

## 2020-05-24 DIAGNOSIS — Z1211 Encounter for screening for malignant neoplasm of colon: Secondary | ICD-10-CM

## 2020-05-24 DIAGNOSIS — N028 Recurrent and persistent hematuria with other morphologic changes: Secondary | ICD-10-CM | POA: Insufficient documentation

## 2020-05-24 DIAGNOSIS — G4733 Obstructive sleep apnea (adult) (pediatric): Secondary | ICD-10-CM | POA: Insufficient documentation

## 2020-05-24 DIAGNOSIS — E039 Hypothyroidism, unspecified: Secondary | ICD-10-CM | POA: Insufficient documentation

## 2020-05-24 DIAGNOSIS — E669 Obesity, unspecified: Secondary | ICD-10-CM | POA: Diagnosis not present

## 2020-05-24 LAB — COMPREHENSIVE METABOLIC PANEL
ALT: 115 U/L — ABNORMAL HIGH (ref 0–44)
AST: 157 U/L — ABNORMAL HIGH (ref 15–41)
Albumin: 4.5 g/dL (ref 3.5–5.0)
Alkaline Phosphatase: 78 U/L (ref 38–126)
Anion gap: 14 (ref 5–15)
BUN: 15 mg/dL (ref 6–20)
CO2: 21 mmol/L — ABNORMAL LOW (ref 22–32)
Calcium: 8.9 mg/dL (ref 8.9–10.3)
Chloride: 100 mmol/L (ref 98–111)
Creatinine, Ser: 1.18 mg/dL (ref 0.61–1.24)
GFR, Estimated: >60 mL/min (ref >60)
Glucose, Bld: 77 mg/dL (ref 70–99)
Potassium: 4.2 mmol/L (ref 3.5–5.1)
Sodium: 135 mmol/L (ref 135–145)
Total Bilirubin: 0.7 mg/dL (ref 0.3–1.2)
Total Protein: 7.5 g/dL (ref 6.5–8.1)

## 2020-05-24 LAB — LIPID PANEL
Cholesterol: 196 mg/dL (ref 0–200)
HDL: 46 mg/dL (ref >40)
LDL Cholesterol: 132 mg/dL — ABNORMAL HIGH (ref 0–99)
Total CHOL/HDL Ratio: 4.3 RATIO
Triglycerides: 88 mg/dL (ref <150)
VLDL: 18 mg/dL (ref 0–40)

## 2020-05-24 LAB — TSH: TSH: 0.199 u[IU]/mL — ABNORMAL LOW (ref 0.350–4.500)

## 2020-05-24 NOTE — Assessment & Plan Note (Signed)
Has been doing well in regards to recent lifestyle modifications, reports intentional weight loss of over 20 pounds over the past few months, provided encouragement regarding this Discussed recommended weekly activity level to rule out healthy weight loss Discussed dietary recommendations, will also refer to dietitian -we will check to see when dietitian will be seeing patients here at [facility] For now, hold off on any pharmacologic interventions, discussed that these do have risks associated with them, for specific questions regarding semaglutide, there is risk related to kidney injury

## 2020-05-24 NOTE — Assessment & Plan Note (Signed)
By history, has been doing well Patient not interested in sleep study or further evaluation of machine due to financial reasons Continue to monitor for any change in sleep habits or patterns

## 2020-05-24 NOTE — Assessment & Plan Note (Signed)
Check kidney function today We will refer to nephrology for patient to reestablish

## 2020-05-24 NOTE — Progress Notes (Signed)
New Patient Office Visit  Subjective:  Patient ID: Kelly Johnston, male    DOB: 06/21/1970  Age: 50 y.o. MRN: [id number]  CC:  Chief Complaint  Patient presents with  . Hypothyroidism  . Obesity    HPI Kelly Johnston is a 50 year old male presenting to establish in clinic.  Present concerns today are regarding management of chronic conditions, weight management.  Past medical history for chart of hypothyroidism, IgA nephropathy, sleep apnea, obesity, depression.  Problem list indicates history of hypertension, however patient reports that he has not had prior issues with his blood pressure and is currently on "blood pressure medications" to treat his kidney issues.  Hypothyroidism: Denies any symptoms today.  Uncertain of true diagnosis or if this was added to problem list based on patient's prescribed medications.  Patient not aware of diagnosis of hypothyroidism when discussing office today.  It is possible that liothyronine was added to augment treatment of depression as an off label use or possibly related to treatment with ECT.  Most recent TSH was within normal limits in 2019.  IgA nephropathy: Was following with nephrology in the past, has not seen them in at least 5 to 6 years.  Currently taking hydralazine, lisinopril.  Interested in returning to establish with nephrology.  Prior refills of medications and laboratory draws were completed with psychiatrist.  Sleep apnea: Diagnosed at least 6 years ago.  Has been using CPAP at home.  Reports that due to finances, gets most of what he needs for his machine off the Internet.  Feels that he has been sleeping well, typically rested during the day.  Obesity: Reports he has been working on lifestyle modifications, including starting a walking program as well as strength training at the gym.  Over the past few weeks he has been walking about 3 miles per day about 3 to 4 days/week.  He will go to the gym a few times per week.  Reports he has  been adhering to the Mediterranean diet over the same time.  Recently was told about Ozempic from his psychiatrist as a medication used to treat obesity, has some questions about this today.  Depression: See psychiatry, follow-up depending upon how well controlled he is.  Presently taking mirtazapine, olanzapine, venlafaxine XR.  Presently feels that he is doing well.  Has been treated with ECT in the past, several other medications which have not worked as well.  Past Medical History:  Diagnosis Date  . Anxiety   . Benzodiazepine withdrawal (HCC)   . Chronic kidney disease    protein builds up  . Depression   . Heart murmur   . Hypertension   . IgA nephropathy   . Sleep apnea    cpap    Past Surgical History:  Procedure Laterality Date  . chest surgery    . INCISION AND DRAINAGE ABSCESS  01/16/2012   Procedure: INCISION AND DRAINAGE ABSCESS;  Surgeon: Wilmon Arms. Corliss Skains, MD;  Location: [facility];  Service: General;  Laterality: Left;  . PECTUS EXCAVATUM REPAIR      Family History  Problem Relation Age of Onset  . Testicular cancer Father   . Alcohol abuse Cousin     Social History   Socioeconomic History  . Marital status: Divorced    Spouse name: Not on file  . Number of children: 3  . Years of education: Not on file  . Highest education level: Professional school degree (e.g., MD, DDS, DVM, JD)  Occupational History  .  Not on file  Tobacco Use  . Smoking status: Never Smoker  . Smokeless tobacco: Never Used  Vaping Use  . Vaping Use: Never used  Substance and Sexual Activity  . Alcohol use: No  . Drug use: Not Currently    Types: Marijuana    Comment: last used 3 to 4 years ago  . Sexual activity: Yes    Birth control/protection: Other-see comments  Other Topics Concern  . Not on file  Social History Narrative  . Not on file   Social Determinants of Health   Financial Resource Strain: Not on file  Food Insecurity: Not on file  Transportation Needs: Not on  file  Physical Activity: Not on file  Stress: Not on file  Social Connections: Not on file  Intimate Partner Violence: Not on file    Objective:   Today's Vitals: BP 112/74   Pulse (!) 105   Ht 6\' 5"  (1.956 m)   Wt (!) 315 lb (142.9 kg)   SpO2 95%   BMI 37.35 kg/m   Physical Exam  50 year old male in no acute distress Cardiovascular exam with slightly tachycardic rate, normal rhythm, no murmurs appreciated Lungs clear to auscultation bilaterally  Assessment & Plan:   Problem List Items Addressed This Visit      Respiratory   Sleep apnea    By history, has been doing well Patient not interested in sleep study or further evaluation of machine due to financial reasons Continue to monitor for any change in sleep habits or patterns      Relevant Orders   CBC with Differential/Platelet   TSH     Endocrine   Hypothyroidism - Primary    Certain if true diagnosis of hypothyroidism in the past, patient unaware of this diagnosis It is possible that liothyronine was started as antidepressant augmentation Will check TSH, last check was WNL in 2019      Relevant Orders   Comprehensive metabolic panel   TSH     Genitourinary   IgA nephropathy    Check kidney function today We will refer to nephrology for patient to reestablish      Relevant Orders   Ambulatory referral to Nephrology   CBC with Differential/Platelet   Comprehensive metabolic panel     Other   Obesity (BMI 30.0-34.9)    Has been doing well in regards to recent lifestyle modifications, reports intentional weight loss of over 20 pounds over the past few months, provided encouragement regarding this Discussed recommended weekly activity level to rule out healthy weight loss Discussed dietary recommendations, will also refer to dietitian -we will check to see when dietitian will be seeing patients here at [facility] For now, hold off on any pharmacologic interventions, discussed that these do  have risks associated with them, for specific questions regarding semaglutide, there is risk related to kidney injury      Relevant Orders   Ambulatory referral to Nephrology   Ambulatory referral to Gastroenterology   CBC with Differential/Platelet   Lipid panel   Comprehensive metabolic panel   TSH    Other Visit Diagnoses    Screening for colon cancer       Relevant Orders   Ambulatory referral to Gastroenterology      Outpatient Encounter Medications as of 05/24/2020  Medication Sig  . hydrALAZINE (APRESOLINE) 50 MG tablet TAKE ONE TABLET BY MOUTH THREE TIMES A DAY  . liothyronine (CYTOMEL) 25 MCG tablet Take 2 tablets (50 mcg total) by mouth daily.  05/26/2020  lisinopril (ZESTRIL) 40 MG tablet TAKE ONE TABLET BY MOUTH DAILY  . mirtazapine (REMERON) 45 MG tablet Take 1 tablet (45 mg total) by mouth at bedtime.  Marland Kitchen OLANZapine (ZYPREXA) 5 MG tablet Take 1 tablet (5 mg total) by mouth at bedtime.  . sildenafil (VIAGRA) 100 MG tablet Take 1 tablet (100 mg total) by mouth daily as needed.  . venlafaxine XR (EFFEXOR-XR) 75 MG 24 hr capsule Take 3 capsules (225 mg total) by mouth daily with breakfast.   No facility-administered encounter medications on file as of 05/24/2020.   Spent 65 minutes on this patient encounter, including preparation, chart review, face-to-face counseling with patient and coordination of care, and documentation of encounter  Follow-up: No follow-ups on file.   Ginia Rudell J De Peru, MD

## 2020-05-24 NOTE — Patient Instructions (Signed)
  Medication Instructions:  Your physician recommends that you continue on your current medications as directed. Please refer to the Current Medication list given to you today. --If you need a refill on any your medications before your next appointment, please call your pharmacy first. If no refills are authorized on file call the office.--  Lab Work: Your physician has recommended that you have lab work today: Research officer, political party, Thyroid Panel, Lipid Profile, and CBC If you have labs (blood work) drawn today and your tests are completely normal, you will receive your results only by: Marland Kitchen MyChart Message (if you have MyChart) OR . A phone call from our staff. Please ensure you check your voicemail in the event that you authorized detailed messages to be left on a delegated number. If you have any lab test that is abnormal or we need to change your treatment, we will call you to review the results.  Referrals/Procedures/Imaging: A referral has been placed for you to Dr. Arrie Aran with [facility]. Someone from the scheduling department will be in contact with you in regards to coordinating your consultation. If you do not hear from any of the schedulers within 7-10 business days please give our office a call.  A referral has been placed for you to a Gastroenterologist for a Colonoscopy. Someone from the scheduling department will be in contact with you in regards to coordinating your consultation. If you do not hear from any of the schedulers within 7-10 business days please give our office a call.  Follow-Up: Your next appointment:   Your physician recommends that you schedule a follow-up appointment in: 1 MONTH with Dr. De Peru  Thanks for letting us be apart of your health journey!!  Primary Care and Sports Medicine    Dr. de Peru and Shawna Clamp, DNP, AGNP  We recommend signing up for the patient portal called "MyChart".  Sign up information is provided on this After  Visit Summary.  MyChart is used to connect with patients for Virtual Visits (Telemedicine).  Patients are able to view lab/test results, encounter notes, upcoming appointments, etc.  Non-urgent messages can be sent to your provider as well.   To learn more about what you can do with MyChart, go to [email or web address].

## 2020-05-24 NOTE — Assessment & Plan Note (Signed)
Certain if true diagnosis of hypothyroidism in the past, patient unaware of this diagnosis It is possible that liothyronine was started as antidepressant augmentation Will check TSH, last check was WNL in 2019

## 2020-05-25 ENCOUNTER — Other Ambulatory Visit (HOSPITAL_BASED_OUTPATIENT_CLINIC_OR_DEPARTMENT_OTHER): Payer: Self-pay | Admitting: Family Medicine

## 2020-05-25 DIAGNOSIS — E669 Obesity, unspecified: Secondary | ICD-10-CM

## 2020-05-25 DIAGNOSIS — N028 Recurrent and persistent hematuria with other morphologic changes: Secondary | ICD-10-CM

## 2020-05-25 DIAGNOSIS — R7401 Elevation of levels of liver transaminase levels: Secondary | ICD-10-CM

## 2020-05-25 DIAGNOSIS — E039 Hypothyroidism, unspecified: Secondary | ICD-10-CM

## 2020-05-25 NOTE — Progress Notes (Signed)
Recent labs with elevation in AST and ALT.  Due to this, will order labs to evaluate further.

## 2020-05-26 ENCOUNTER — Telehealth (HOSPITAL_BASED_OUTPATIENT_CLINIC_OR_DEPARTMENT_OTHER): Payer: Self-pay

## 2020-05-26 NOTE — Telephone Encounter (Signed)
-----   Message from Hosie Poisson Peru, MD sent at 05/25/2020 12:52 PM EDT ----- TSH is low indicating excess thyroid hormone.  Would decrease liothyronine to 1 pill daily (25 mcg).  Plan for recheck of TSH in about 6 weeks after changing dose.  Cholesterol numbers look good.  Liver enzymes were elevated, this could be related to medications or other causes.  Would initially evaluate with a few additional labs which have been placed as well as an ultrasound of the liver.  Further evaluation to be decided upon once these results are obtained.

## 2020-05-26 NOTE — Telephone Encounter (Signed)
Pt is aware and agreeable to results and recommendations Pt is agreeable to decreasing Cytomel to 1 tablet (25 mcg) daily Pt is agreeable to lab work and ultrasound Orders are in.

## 2020-05-31 ENCOUNTER — Other Ambulatory Visit (HOSPITAL_BASED_OUTPATIENT_CLINIC_OR_DEPARTMENT_OTHER)
Admission: RE | Admit: 2020-05-31 | Discharge: 2020-05-31 | Disposition: A | Discharge status: Home / Self Care | Payer: 59 | Source: Ambulatory Visit | Attending: Family Medicine | Admitting: Family Medicine

## 2020-05-31 ENCOUNTER — Other Ambulatory Visit: Payer: Self-pay | Admitting: Psychiatry

## 2020-05-31 ENCOUNTER — Ambulatory Visit (HOSPITAL_BASED_OUTPATIENT_CLINIC_OR_DEPARTMENT_OTHER)
Admission: RE | Admit: 2020-05-31 | Discharge: 2020-05-31 | Disposition: A | Discharge status: Home / Self Care | Payer: 59 | Source: Ambulatory Visit | Attending: Family Medicine | Admitting: Family Medicine

## 2020-05-31 ENCOUNTER — Other Ambulatory Visit: Payer: Self-pay

## 2020-05-31 DIAGNOSIS — R7401 Elevation of levels of liver transaminase levels: Secondary | ICD-10-CM | POA: Insufficient documentation

## 2020-05-31 DIAGNOSIS — K760 Fatty (change of) liver, not elsewhere classified: Secondary | ICD-10-CM | POA: Diagnosis not present

## 2020-05-31 LAB — HEPATITIS B CORE ANTIBODY, IGM: Hep B C IgM: NONREACTIVE

## 2020-05-31 LAB — HEPATITIS B SURFACE ANTIBODY,QUALITATIVE: Hep B S Ab: NONREACTIVE

## 2020-05-31 LAB — HEPATITIS B SURFACE ANTIGEN: Hepatitis B Surface Ag: NONREACTIVE

## 2020-05-31 LAB — HEPATITIS C ANTIBODY: HCV Ab: NONREACTIVE

## 2020-06-02 ENCOUNTER — Telehealth (HOSPITAL_BASED_OUTPATIENT_CLINIC_OR_DEPARTMENT_OTHER): Payer: Self-pay

## 2020-06-02 NOTE — Telephone Encounter (Signed)
-----   Message from Hosie Poisson Peru, MD sent at 06/01/2020 10:43 AM EDT ----- Abnormal appearance of liver observed on ultrasound - radiologist impression is fatty liver which can have various causes. Note added to GI referral for further evaluation with specialist.

## 2020-06-02 NOTE — Telephone Encounter (Signed)
Per DPR left detailed message of [place] and lab results on mobile voicemail Instructed patient to call with any questions or concerns

## 2020-06-03 ENCOUNTER — Other Ambulatory Visit: Payer: Self-pay | Admitting: Psychiatry

## 2020-06-07 NOTE — Telephone Encounter (Signed)
Please review

## 2020-06-20 ENCOUNTER — Telehealth: Payer: Self-pay | Admitting: Psychiatry

## 2020-06-20 ENCOUNTER — Other Ambulatory Visit: Payer: Self-pay | Admitting: Psychiatry

## 2020-06-20 DIAGNOSIS — R635 Abnormal weight gain: Secondary | ICD-10-CM

## 2020-06-20 DIAGNOSIS — F331 Major depressive disorder, recurrent, moderate: Secondary | ICD-10-CM

## 2020-06-20 DIAGNOSIS — T50905A Adverse effect of unspecified drugs, medicaments and biological substances, initial encounter: Secondary | ICD-10-CM

## 2020-06-20 MED ORDER — LYBALVI 5-10 MG PO TABS: 1.0000 | ORAL_TABLET | Freq: Every evening | ORAL | 1 refills | Status: DC

## 2020-06-20 NOTE — Telephone Encounter (Signed)
Pt informed.He will come by and get samples

## 2020-06-20 NOTE — Telephone Encounter (Signed)
Please review

## 2020-06-20 NOTE — Progress Notes (Signed)
Patient had substantial weight gain associated with olanzapine.  Switched to Lybalvi 5-10 is helping him lose weight

## 2020-06-20 NOTE — Telephone Encounter (Signed)
RX sent Lybalvi 5-10 mg 1 nightly.  Likely will need PA.  Pt was having weight gain with olanzapine and is now seeing some weight loss with the transition.  He is pleased with the new medicine.  Okay samples if needed

## 2020-06-20 NOTE — Telephone Encounter (Signed)
Pt call requesting Rx for Lyealvi..Last apt 3/28  CC gave him samples and he is now out of samples. Med working well. Pharmacy HT [facility]. May need more samples if need PA. He's out. He did not know the dose. Box reads 5mg /10mg . Contact # . Next apt 9/28

## 2020-06-22 ENCOUNTER — Encounter (HOSPITAL_BASED_OUTPATIENT_CLINIC_OR_DEPARTMENT_OTHER): Payer: Self-pay | Admitting: Family Medicine

## 2020-06-22 ENCOUNTER — Other Ambulatory Visit: Payer: Self-pay

## 2020-06-22 ENCOUNTER — Ambulatory Visit (INDEPENDENT_AMBULATORY_CARE_PROVIDER_SITE_OTHER): Payer: 59 | Admitting: Family Medicine

## 2020-06-22 VITALS — BP 140/86 | HR 99 | Ht 77.0 in | Wt 330.0 lb

## 2020-06-22 DIAGNOSIS — R7401 Elevation of levels of liver transaminase levels: Secondary | ICD-10-CM

## 2020-06-22 DIAGNOSIS — E669 Obesity, unspecified: Secondary | ICD-10-CM | POA: Diagnosis not present

## 2020-06-22 DIAGNOSIS — K760 Fatty (change of) liver, not elsewhere classified: Secondary | ICD-10-CM | POA: Diagnosis not present

## 2020-06-22 MED ORDER — OZEMPIC (0.25 OR 0.5 MG/DOSE) 2 MG/1.5ML ~~LOC~~ SOPN: PEN_INJECTOR | SUBCUTANEOUS | 0 refills | Status: DC

## 2020-06-22 NOTE — Patient Instructions (Addendum)
  Medication Instructions:  Your physician has recommended you make the following change in your medication:  -- START Ozempic - 0.25 mg - Inject 0.25 mg once weekly for 4 weeks. Then inject 0.5 mg once weekly for 2 weeks --If you need a refill on any your medications before your next appointment, please call your pharmacy first. If no refills are authorized on file call the office.--  Follow-Up: Your next appointment:   Your physician recommends that you schedule a follow-up appointment in: 4 WEEKS with Dr. de Peru  Thanks for letting us be apart of your health journey!!  Primary Care and Sports Medicine   Dr. de Peru and Shawna Clamp, DNP, AGNP  We recommend signing up for the patient portal called "MyChart".  Sign up information is provided on this After Visit Summary.  MyChart is used to connect with patients for Virtual Visits (Telemedicine).  Patients are able to view lab/test results, encounter notes, upcoming appointments, etc.  Non-urgent messages can be sent to your provider as well.   To learn more about what you can do with MyChart, please visit --  [email or web address].

## 2020-06-22 NOTE — Assessment & Plan Note (Signed)
Observed on recent ultrasound with radiology impression of probable early cirrhosis Has been referred to GI, will follow up on this

## 2020-06-22 NOTE — Progress Notes (Signed)
    Procedures performed today:    None.  Independent interpretation of notes and tests performed by another provider:   None.  Brief History, Exam, Impression, and Recommendations:    Kelly Johnston is a 50 year old male presenting for follow-up.  Obesity: Ongoing issue for patient, has tried various lifestyle modifications including low calorie diet, portion control, keto diet as well as adjusting physical activity.  Finds himself to stick with a workout regimen, particular related to going to the gym regularly as he does not find this enjoyable.  Does enjoy going for walks outside, particular around his neighborhood, will be doing this more now with weather change.  Denies any medications tried in the past related to his weight loss goal.  Has heard about Ozempic from another provider and is interested in this particular treatment.  Transaminitis: Found on most recent labs, further work-up revealed fatty liver on ultrasound with probable early cirrhosis per radiologist.  Patient has been referred to GI for further evaluation of this as well as for colon cancer screening.  He is still waiting to hear from gastroenterology office to schedule initial evaluation.  Denies any current abdominal symptoms or abdominal pain.  BP 140/86   Pulse 99   Ht 6\' 5"  (1.956 m)   Wt (!) 330 lb (149.7 kg)   SpO2 99%   BMI 39.13 kg/m   Pleasant 50 year old male in no acute distress Cardiovascular exam with regular rate and rhythm, no murmurs appreciated Lungs clear to auscultation bilaterally  Transaminitis Found on most recent labs, further work-up reveals fatty liver with probable cirrhosis Has been referred to GI, will follow up on status of referral  Fatty liver Observed on recent ultrasound with radiology impression of probable early cirrhosis Has been referred to GI, will follow up on this  Obesity (BMI 30-39.9) Chronic problem for patient with associated hypertension Has tried various lifestyle  modifications including adherence to different dietary programs, modifications of physical activity Pharmacologic treatment of obesity is recommended given lack of improvement seen with conservative and lifestyle modifications as well as given that his BMI is 39 and he has associated hypertension Order placed for semaglutide, patient instructed on proper administration of the medication today Monitor for side effects including nausea, vomiting, GI upset, pancreatitis Medication will be gradually titrated every 4 weeks as long as no side effects being experienced We will follow-up with patient in 4 weeks to monitor progress  Spent 40 minutes on this patient encounter, including preparation, chart review, face-to-face counseling with patient and coordination of care, and documentation of encounter  Plan for follow-up in 4 weeks to monitor response to medication  ___________________________________________ Treyana Sturgell de 43, MD, ABFM, CAQSM Primary Care and Sports Medicine [facility]

## 2020-06-22 NOTE — Assessment & Plan Note (Signed)
Found on most recent labs, further work-up reveals fatty liver with probable cirrhosis Has been referred to GI, will follow up on status of referral

## 2020-06-22 NOTE — Assessment & Plan Note (Signed)
Chronic problem for patient with associated hypertension Has tried various lifestyle modifications including adherence to different dietary programs, modifications of physical activity Pharmacologic treatment of obesity is recommended given lack of improvement seen with conservative and lifestyle modifications as well as given that his BMI is 39 and he has associated hypertension Order placed for semaglutide, patient instructed on proper administration of the medication today Monitor for side effects including nausea, vomiting, GI upset, pancreatitis Medication will be gradually titrated every 4 weeks as long as no side effects being experienced We will follow-up with patient in 4 weeks to monitor progress

## 2020-06-27 ENCOUNTER — Telehealth: Payer: Self-pay

## 2020-06-27 NOTE — Telephone Encounter (Signed)
Prior authorization submitted and DENIED for LYBALVI 5-10 MG TAB with [employer]. Criteria include tried and failed at least 3 formulary alternatives such as risperidone, quetiapine, olanzapine, aripiprazole, or ziprasidone.   Will notify Dr. Jennelle Human and follow up with options.

## 2020-06-28 ENCOUNTER — Other Ambulatory Visit: Payer: Self-pay

## 2020-06-28 MED ORDER — RISPERIDONE 1 MG PO TABS: 1.0000 mg | ORAL_TABLET | Freq: Every day | ORAL | 0 refills | Status: DC

## 2020-06-28 NOTE — Telephone Encounter (Signed)
Pt notified of prior authorization denial and how to proceed. He will get Rx for Risperdal. Pt will be here Thursday with his daughter and needs to pick up Lybalvi samples.   Rx sent for Risperdal Samples pulled for Lybalvi

## 2020-07-10 ENCOUNTER — Other Ambulatory Visit: Payer: Self-pay | Admitting: Psychiatry

## 2020-07-12 NOTE — Telephone Encounter (Signed)
Phone note from 5/3 shows Risperdal was only sent so PA could be approved for Lybalvi.please review

## 2020-07-18 ENCOUNTER — Telehealth: Payer: Self-pay | Admitting: Psychiatry

## 2020-07-18 NOTE — Telephone Encounter (Signed)
TC Pt gets more depressed off olanzapine and on any other atypical tried.  Including risperidone, aripiprazole.  Had SI off olanzapine in the past also. But excessive weight gain on regular olanzapine and wants a trial of Libalvi in place bc contains same olanzapine dose with similar benefit but has ingredient to curb weight gain with olanzapine.  Will try to get Lybalvi covered by insurance.  Pt is obese and needs to lose the weight.  Meredith Staggers, MD, DFAPA

## 2020-07-20 ENCOUNTER — Other Ambulatory Visit: Payer: Self-pay

## 2020-07-20 ENCOUNTER — Telehealth (HOSPITAL_BASED_OUTPATIENT_CLINIC_OR_DEPARTMENT_OTHER): Payer: 59 | Admitting: Family Medicine

## 2020-07-20 NOTE — Telephone Encounter (Signed)
Contacted [employer] to ask since pt tried and failed another required medication if an appeal had to be submitted or a new PA started. Representative advised okay to start a new PA, there is no time requirement.    Prior Authorization submitted for LYBALVI 5-10 MG TABLET

## 2020-07-23 ENCOUNTER — Other Ambulatory Visit: Payer: Self-pay | Admitting: Psychiatry

## 2020-07-27 NOTE — Telephone Encounter (Signed)
Were you able to do a letter for his Lybalvi?

## 2020-07-29 NOTE — Telephone Encounter (Signed)
I dictated the appeal letter for this patient.  Kelly Johnston please call the patient to verify that he would like to stay on the Lybalvi before we send this letter into the insurance company.  He can continue to use samples until we get the appeal approved

## 2020-08-01 NOTE — Telephone Encounter (Signed)
Attempted to call pt since Friday,LVM to call back.

## 2020-08-03 NOTE — Telephone Encounter (Signed)
I did fax the appeal letter and his denial on 08/02/2020. If something changes let me know. The appeals typically take up to 30 days.

## 2020-08-04 NOTE — Telephone Encounter (Signed)
Spoke with Kelly Johnston and he said the Claretha Cooper is working very well. He has ran out of samples and  I instructed him to come by office to pick up samples as soon as he could. He said he would. Informed him Dr. Jennelle Human was still working on an appeal for him.    Sheralyn Boatman, can you please pull samples for him. 5-10 mg

## 2020-08-04 NOTE — Telephone Encounter (Signed)
Samples pulled  

## 2020-08-10 ENCOUNTER — Telehealth: Payer: Self-pay

## 2020-08-10 NOTE — Telephone Encounter (Signed)
Contacted pt to let him know he needs to stop by the office to sign an ROI and other forms related to his appeal process for Lybalvi. Pt reports he can stop by tomorrow 08/11/2020. He was told to ask for [facility] or Kaiya Boatman.

## 2020-09-05 NOTE — Telephone Encounter (Signed)
Can you remind Kelly Johnston to come in and sign papers for [place] to proceed on his appeal please. I have it all ready on my desk.

## 2020-09-06 NOTE — Telephone Encounter (Signed)
He will be here tomorow

## 2020-09-06 NOTE — Telephone Encounter (Signed)
LVM with info and I will try to call again later today

## 2020-09-12 ENCOUNTER — Encounter (HOSPITAL_BASED_OUTPATIENT_CLINIC_OR_DEPARTMENT_OTHER): Payer: Self-pay | Admitting: Family Medicine

## 2020-09-12 DIAGNOSIS — G4733 Obstructive sleep apnea (adult) (pediatric): Secondary | ICD-10-CM

## 2020-09-21 ENCOUNTER — Ambulatory Visit (HOSPITAL_BASED_OUTPATIENT_CLINIC_OR_DEPARTMENT_OTHER): Payer: 59 | Admitting: Family Medicine

## 2020-11-23 ENCOUNTER — Ambulatory Visit: Payer: 59 | Admitting: Psychiatry

## 2020-11-29 ENCOUNTER — Other Ambulatory Visit: Payer: Self-pay | Admitting: Psychiatry

## 2020-11-29 DIAGNOSIS — F3342 Major depressive disorder, recurrent, in full remission: Secondary | ICD-10-CM

## 2020-11-30 ENCOUNTER — Encounter: Payer: Self-pay | Admitting: Neurology

## 2020-11-30 ENCOUNTER — Other Ambulatory Visit: Payer: Self-pay

## 2020-11-30 ENCOUNTER — Ambulatory Visit (INDEPENDENT_AMBULATORY_CARE_PROVIDER_SITE_OTHER): Payer: 59 | Admitting: Neurology

## 2020-11-30 VITALS — BP 110/74 | HR 92 | Ht 77.0 in | Wt 326.0 lb

## 2020-11-30 DIAGNOSIS — G4733 Obstructive sleep apnea (adult) (pediatric): Secondary | ICD-10-CM

## 2020-11-30 DIAGNOSIS — Z9989 Dependence on other enabling machines and devices: Secondary | ICD-10-CM | POA: Diagnosis not present

## 2020-11-30 NOTE — Progress Notes (Signed)
Subjective:    Patient ID: Kelly Johnston is a 50 y.o. male.  HPI    Star Age, MD, PhD [facility] [address], [address] [place], [place] [address]  Dear Dr. de [place],   I saw your patient, Kelly Johnston, upon your kind request in my sleep clinic today for consultation of his obstructive sleep apnea, for reevaluation.  The patient is unaccompanied today.  As you know, Kelly Johnston is a 50 year old right-handed gentleman with an underlying medical history of hypertension, IgA nephropathy, depression, anxiety, elevated liver enzymes, fatty liver, chronic kidney disease, and obesity, who carries a longstanding history of obstructive sleep apnea.  He has been on CPAP therapy for years.  I had evaluated him several years ago.  He has not been seen in this office in over 6 years.  I reviewed your office note from 06/22/2020.  His Epworth sleepiness score is 4 out of 24, fatigue severity score is 20 out of 63.  He reports full compliance with his CPAP machine and cannot sleep without it, takes it on any trips as well.  He has an Publishing copy, older CPAP machine model and reports that he has seen an error message on it stating that the motor life has been exceeded.  His previous DME company was [facility] [employer], he has been using nasal pillows, medium Swift FX nasal pillows.  He has been getting his supplies online, does report he needs a replacement for his nasal pillows.  He has been on several medications for his mood disorder, he is followed by psychiatry.  He is currently on fairly high dose of Remeron at night, Effexor total dose of 225 mg daily and takes Risperdal 1 mg at bedtime, also takes Lybalvi at bedtime.  He is no longer on Ozempic.  Weight has been fluctuating.  He no longer works at [employer], he is now working as a Music therapist.  His dad lives with him, he is 22 years old, he has 2 dogs in the household.  He does not drink caffeine on a daily basis, rare  alcohol, is a non-smoker.  Bedtime is generally around 9 PM and rise time around 7:45 AM.  I was able to review his recent compliance data for the past 30 days, he was 100% compliant, I also reviewed his past 90-day compliance, he has been 100% compliant with percent use days greater than 4 hours also at 100%, average usage high at 10 hours and 23 minutes.  He reports that when he does not have his kids with him he tends to go to bed early and stay asleep longer.  His AHI on average is at goal at 0.3/h, leak on the higher side with fluctuating leak noted, 95th percentile at 29.3 L/min on a pressure of 11 cm with EPR of 2.  His kids stay with him every other week, he has 3 teenagers. He has benefited greatly from CPAP therapy, in fact, feels that he cannot sleep without it.  He reports benefit regarding his sleep quality, sleep consolidation and daytime energy.  He does not have recurrent morning headaches or night to night nocturia.  Previously (copied from previous notes for reference):   07/09/14: 50 year old right-handed gentleman with an underlying medical history of depression, anxiety, and hypothyroidism, who presents for followup consultation of his severe OSA, treated with CPAP. The patient is unaccompanied today. I last saw him on 04/08/2013, at which time he reported compliance with CPAP therapy  but inability to use it when he had congestion or cold symptoms. He was doing well with CPAP therapy and reported improved sleep. I suggested a one year FU.    I reviewed his compliance data from 06/07/2014 through 07/06/2014 which is a total of 30 days during which time he used his machine only 17 days with percent used days greater than 4 hours at only 40%, indicating poor compliance with an average usage for all nights of only 2 hours and 47 minutes, pressure of 7 cm with EPR of 2. Residual AHI high at 16.7 per hour and leak acceptable with the 95th percentile at 17 L/m. compliance data from the last 90  days showed a little bit better compliance with a compliance percentage of 54%. Residual AHI was little bit better at 12.4 per hour.   He reports doing overall well. 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 He has joint custody with his ex-wife. He has 3 children. He will be spending more time with his children over the summer break. He admits to skipping nights (he indicates he has a girlfriend and when he stays at her place, he does not take the machine with him). He had lost some weight but unfortunately he has gained it back.    Previously:   I reviewed his CPAP compliance data from 03/06/2014 through 04/04/2014 which is a total of 30 days during which time he used his machine 24 days with percent used days greater than 4 hours of 60%, indicating suboptimal compliance with an average usage of 4 hours and 20 minutes, pressure at 7 cm with EPR of 2, residual AHI suboptimal at 9.9 per hour, leak acceptable at 18 L/m for the 95th percentile.   I saw him on 10/06/2012, at which time we discussed a sleep study findings and his compliance. He was doing well with CPAP and was compliant with treatment. I suggested a six-month followup and yearly thereafter if he continued to do well.   I reviewed the compliance data from 07/10/2012 through 08/08/2012 and during this 30 day window he use CPAP 23 days. Percent used days greater than 4 hours was 67% which indicates suboptimal compliance. Average usage for all days was 4 hours and 9 minutes. He is residual AHI was 6.3 with acceptable leak. Pressure was 7 cm with CPR of 2.    I first met him on 04/28/2012 at the request of his PCP. He reported a long-standing history of snoring and EDS at the time. He had a split-night sleep study on 05/13/2012: His baseline sleep efficiency was 75% with a latency to sleep of 6 minutes. Of note, the patient was noted to be nauseated upon arrival and vomited before the test started as well as in the morning. He felt that he had had a stomach  bug. He had a markedly increased percentage of stage I sleep and absence of REM sleep prior to CPAP. He had mild to moderate snoring. He had a total of 46 obstructive apneas as well as 101 obstructive hypopneas, rendering in overall AHI of 74.5 per hour. His baseline oxygen saturation was 91% and his nadir was 85%. He was then titrated on CPAP from 5-8 cm of water pressure and had complete resolution of his sleep disordered breathing on 7 cm of pressure. He was noted to have periodic leg movements of sleep. His PLMS were severe post CPAP at 69.8 per hour with an associated arousal index of 8.2 per hour. He was started on  CPAP. 30 day compliance data from 06/10/2012 to 07/09/2012 showed: daily usage, except for one day. His percent used days greater than 4 hours was 90%, indicating great compliance. His residual AHI was 3.1, indicating an adequate treatment pressure of 7 with EPR of 2. His average usage was 6 hours and 7 minutes for all days.  His Past Medical History Is Significant For: Past Medical History:  Diagnosis Date   Anxiety    Benzodiazepine withdrawal ([place])    Chronic kidney disease    protein builds up   Depression    Heart murmur    Hypertension    IgA nephropathy    Sleep apnea    cpap    His Past Surgical History Is Significant For: Past Surgical History:  Procedure Laterality Date   chest surgery     INCISION AND DRAINAGE ABSCESS  01/16/2012   Procedure: INCISION AND DRAINAGE ABSCESS;  Surgeon: Imogene Burn. Georgette Dover, MD;  Location: [facility];  Service: General;  Laterality: Left;   PECTUS EXCAVATUM REPAIR      His Family History Is Significant For: Family History  Problem Relation Age of Onset   Testicular cancer Father    Alcohol abuse Cousin    Sleep apnea Neg Hx     His Social History Is Significant For: Social History   Socioeconomic History   Marital status: Divorced    Spouse name: Not on file   Number of children: 3   Years of education: Not on file   Highest  education level: Professional school degree (e.g., MD, DDS, DVM, JD)  Occupational History   Not on file  Tobacco Use   Smoking status: Never   Smokeless tobacco: Never  Vaping Use   Vaping Use: Never used  Substance and Sexual Activity   Alcohol use: Yes    Comment: OCC   Drug use: Not Currently    Types: Marijuana    Comment: last used 3 to 4 years ago   Sexual activity: Yes    Birth control/protection: Other-see comments  Other Topics Concern   Not on file  Social History Narrative   Not on file   Social Determinants of Health   Financial Resource Strain: Not on file  Food Insecurity: Not on file  Transportation Needs: Not on file  Physical Activity: Not on file  Stress: Not on file  Social Connections: Not on file    His Allergies Are:  Allergies  Allergen Reactions   Latex Rash  :   His Current Medications Are:  Outpatient Encounter Medications as of 11/30/2020  Medication Sig   liothyronine (CYTOMEL) 25 MCG tablet TAKE TWO TABLETS BY MOUTH DAILY   lisinopril (ZESTRIL) 40 MG tablet TAKE ONE TABLET BY MOUTH DAILY   mirtazapine (REMERON) 45 MG tablet Take 1 tablet (45 mg total) by mouth at bedtime.   OLANZapine (ZYPREXA) 5 MG tablet TAKE ONE TABLET BY MOUTH EVERY NIGHT AT BEDTIME   OLANZapine-Samidorphan (LYBALVI) 5-10 MG TABS Take 1 tablet by mouth at bedtime.   risperiDONE (RISPERDAL) 1 MG tablet Take 1 tablet (1 mg total) by mouth at bedtime.   sildenafil (VIAGRA) 100 MG tablet Take 1 tablet (100 mg total) by mouth daily as needed.   venlafaxine XR (EFFEXOR-XR) 75 MG 24 hr capsule Take 3 capsules (225 mg total) by mouth daily with breakfast.   hydrALAZINE (APRESOLINE) 50 MG tablet TAKE ONE TABLET BY MOUTH THREE TIMES A DAY   Semaglutide,0.25 or 0.5MG /DOS, (OZEMPIC, 0.25 OR 0.5 MG/DOSE,) 2  MG/1.5ML SOPN Inject 0.25 mg once weekly for 4 weeks. Then inject 0.5 mg per week for 2 weeks   No facility-administered encounter medications on file as of 11/30/2020.   :   Review of Systems:  Out of a complete 14 point review of systems, all are reviewed and negative with the exception of these symptoms as listed below:   Review of Systems  Neurological:        Pt is here today to get  a new CPAP machine . Pt states machine is old and is on its last leg. .   ESS:4 FSS:20   Objective:  Neurological Exam  Physical Exam Physical Examination:   Vitals:   11/30/20 1505  BP: 110/74  Pulse: 92   General Examination: The patient is a very pleasant 50 y.o. male in no acute distress. He appears well-developed and well-nourished and well groomed.   HEENT: Normocephalic, atraumatic, pupils are equal, round and reactive to light, extraocular tracking is good without limitation to gaze excursion or nystagmus noted. Hearing is grossly intact. Face is symmetric with normal facial animation. Speech is clear with no dysarthria noted. There is no hypophonia. There is no lip, neck/head, jaw or voice tremor. Neck is supple with full range of passive and active motion. There are no carotid bruits on auscultation. Oropharynx exam reveals: mild mouth dryness, adequate dental hygiene and moderate airway crowding, due to tonsillar size of 1-2+, thicker soft palate.  Tongue protrudes centrally and palate elevates symmetrically, Mallampati class III.  Neck circumference of 18-1/2 inches.  Chest: Clear to auscultation without wheezing, rhonchi or crackles noted.  Heart: S1+S2+0, regular and normal without murmurs, rubs or gallops noted.   Abdomen: Soft, non-tender and non-distended.  Extremities: There is no pitting edema in the distal lower extremities bilaterally.   Skin: Warm and dry without trophic changes noted.   Musculoskeletal: exam reveals no obvious joint deformities, tenderness or joint swelling or erythema.   Neurologically:  Mental status: The patient is awake, alert and oriented in all 4 spheres. His immediate and remote memory, attention, language  skills and fund of knowledge are appropriate. There is no evidence of aphasia, agnosia, apraxia or anomia. Speech is clear with normal prosody and enunciation. Thought process is linear. Mood is normal and affect is normal.  Cranial nerves II - XII are as described above under HEENT exam.  Motor exam: Normal bulk, strength and tone is noted. There is no tremor, Romberg is negative. Fine motor skills and coordination: grossly intact.  Cerebellar testing: No dysmetria or intention tremor. There is no truncal or gait ataxia.  Sensory exam: intact to light touch in the upper and lower extremities.  Gait, station and balance: He stands easily. No veering to one side is noted. No leaning to one side is noted. Posture is age-appropriate and stance is narrow based. Gait shows normal stride length and normal pace. No problems turning are noted.   Assessment and plan:  In summary, AMARION PORTELL is a very pleasant 50 y.o.-year old male with an underlying medical history of hypertension, IgA nephropathy, depression, anxiety, elevated liver enzymes, fatty liver, chronic kidney disease, and obesity, who presents for evaluation of his obstructive sleep apnea.  He carries a longstanding history of obstructive sleep apnea.  He had a baseline sleep study through [place] in 2014 which indicated severe obstructive sleep apnea.  He had a in lab CPAP titration study in June 2016 which showed good results with CPAP of 11 cm.  He has been on CPAP therapy since.  He is fully compliant with treatment, indicates that the machine has shown an error message.  He continues to benefit from treatment.  He has excellent compliance even in the past 3 months when I looked at his compliance data.  He is advised to follow-up with [place] on a once a year basis at least.  I will write for a new machine based on his prior test results and good compliance and good benefit reported.  We will send the order to his previous DME company, [employer] and he is  advised to make a follow-up appointment in this clinic within 1 to 3 months after he starts treatment with a new equipment.  He is reminded to stay compliant with treatment.  We talked about the importance of treating sleep apnea and regular follow-up in sleep clinic.  If insurance requires repeat testing, we will proceed with a home sleep test for confirmation of his sleep apnea diagnosis.  I answered all his questions today and he was in agreement with the plan.    Thank you very much for allowing me to participate in the care of this nice patient. If I can be of any further assistance to you please do not hesitate to call me at [phone].  Sincerely,   Star Age, MD, PhD

## 2020-12-10 ENCOUNTER — Other Ambulatory Visit: Payer: Self-pay | Admitting: Psychiatry

## 2020-12-10 DIAGNOSIS — F4010 Social phobia, unspecified: Secondary | ICD-10-CM

## 2020-12-10 DIAGNOSIS — F3342 Major depressive disorder, recurrent, in full remission: Secondary | ICD-10-CM

## 2020-12-21 ENCOUNTER — Other Ambulatory Visit: Payer: Self-pay | Admitting: Psychiatry

## 2021-01-09 ENCOUNTER — Encounter: Payer: Self-pay | Admitting: Neurology

## 2021-01-09 DIAGNOSIS — G4733 Obstructive sleep apnea (adult) (pediatric): Secondary | ICD-10-CM

## 2021-01-09 NOTE — Telephone Encounter (Signed)
Order placed for new CPAP machine, I may have forgotten to enter the order, I am not sure what happened other than true oversight.

## 2021-01-09 NOTE — Telephone Encounter (Signed)
Orders sent over to [facility] w/ [employer].

## 2021-01-09 NOTE — Telephone Encounter (Signed)
Brantley Fling, RN Hey! I called this patient and let him know that the best option would be to wait until he gets the new insurance. He is okay with that. He will call me back and let me know when he has that policy and then I will process the order. We will be okay with his current SLeep Study!

## 2021-01-25 ENCOUNTER — Other Ambulatory Visit: Payer: Self-pay

## 2021-01-25 ENCOUNTER — Encounter: Payer: Self-pay | Admitting: Psychiatry

## 2021-01-25 ENCOUNTER — Ambulatory Visit (INDEPENDENT_AMBULATORY_CARE_PROVIDER_SITE_OTHER): Payer: 59 | Admitting: Psychiatry

## 2021-01-25 DIAGNOSIS — T50905A Adverse effect of unspecified drugs, medicaments and biological substances, initial encounter: Secondary | ICD-10-CM

## 2021-01-25 DIAGNOSIS — F4010 Social phobia, unspecified: Secondary | ICD-10-CM | POA: Diagnosis not present

## 2021-01-25 DIAGNOSIS — F411 Generalized anxiety disorder: Secondary | ICD-10-CM | POA: Diagnosis not present

## 2021-01-25 DIAGNOSIS — N522 Drug-induced erectile dysfunction: Secondary | ICD-10-CM

## 2021-01-25 DIAGNOSIS — F3342 Major depressive disorder, recurrent, in full remission: Secondary | ICD-10-CM

## 2021-01-25 DIAGNOSIS — R635 Abnormal weight gain: Secondary | ICD-10-CM | POA: Diagnosis not present

## 2021-01-25 DIAGNOSIS — R7989 Other specified abnormal findings of blood chemistry: Secondary | ICD-10-CM

## 2021-01-25 MED ORDER — LYBALVI 5-10 MG PO TABS: 1.0000 | ORAL_TABLET | Freq: Every evening | ORAL | 1 refills | Status: DC

## 2021-01-25 NOTE — Progress Notes (Signed)
Kelly Johnston [id number] 1970/05/12 50 y.o.  Subjective:   Patient ID:  Kelly Johnston is a 50 y.o. (DOB 19-Aug-1970) male.  Chief Complaint:  Chief Complaint  Patient presents with   Follow-up   Depression    Kelly Johnston Follow-up of severe depression with psychosis, altered mental status and anxiety requiring ECT  Att visit Jun 30, 2018.  Was completely in remission on May 4 when seen.  The only change since then has been discontinuing olanzapine. We completed weaned off olanzapine and discontinued it.  At follow-up visit August 06, 2018 he had relapsed with depression and rumination and anxiety.  Venlafaxine was increased to 225 mg daily.  Olanzapine was restarted at 5 mg nightly.  visit in November 2020 his depression and anxiety symptoms had largely resolved.  There were no med changes.  05/20/19 appt with the following noted: Still Johnston with depression.  Nothing has gotten worse.  Able to be productive and enjoy things.  Anxiety is managed and Ativan does a little without se.  Pleased with meds without SE. "More normal".  Hopeless resolved.  Quick improvement so probably the olanzapine helped.  Residual depression and anxiety are the same and not insurmountable.  Tears resolved.  Sleep without   Initial and terminal insomnia.  Some worry over job and finances.  Conc is better.  Caffeine not after 5.  1 diet coke in afternoon.  No RLS.   Almost never using Ativan. 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 Plan: no med changes  11/19/2019 appointment with the following noted: Good. No issues with depression.  Anxiety Johnston. No Ativan used Patient reports stable mood and denies depressed or irritable moods.  Patient denies any recent difficulty with anxiety.  Patient denies difficulty with sleep initiation or maintenance. Denies appetite disturbance.  Patient reports that energy and motivation have been good.  Patient denies any difficulty with  concentration.  Patient denies any suicidal ideation. No SE concerns. Plan no med changes  05/23/2020 appointment with the following noted:  doing good without relapses and satisfied with meds. Happier with career choice.  Makes a difference with mental health.  Doing real estate appraisals.  Staying busy.  Anxiety goodl Patient reports stable mood and denies depressed or irritable moods.  Patient denies any recent difficulty with anxiety.  Patient denies difficulty with sleep initiation or maintenance. Denies appetite disturbance.  Patient reports that energy and motivation have been good.  Patient denies any difficulty with concentration.  Patient denies any suicidal ideation. Wonders if binge eating disorder.  Uncontrollable urge even if not hungry.  Goes to extreme.  Wonders if started when depressed.  01/25/21 appt noted: Still good without relapses of depression since here. Work is Firefighter. Patient reports stable mood and denies depressed or irritable moods.  Patient denies any recent difficulty with anxiety.  Patient denies difficulty with sleep initiation or maintenance. Denies appetite disturbance.  Patient reports that energy and motivation have been good.  Patient denies any difficulty with concentration.  Patient denies any suicidal ideation. Satisfied with meds.  Disc Lybalvi approval. Tolerating the meds except for the weight gain.  He has not been taking Lybalvi yet.  We discussed the approval by the [facility] of insurance  He had a psych hosp at [facility] late 2019.  Admitted with hallucinations, delusions and memory problems and had accidentally stopped psych meds.  He had a mixture of symptoms that was diagnosed is  perhaps delirium related to benzodiazepine withdrawal as well as depression with psychotic features having just had an ECT treatment.  He was hospitalized at [facility] from December 3 until December 5.    He completed ECT treatment .  Prior psychiatric  meds lithium, venlafaxine XR150 , duloxetine 120, fluoxetine 60, Wellbutrin 450, Abilify, Viibryd, Cytomel, Rexulti 2 mg, mirtazapine 45 mg, olanzapine 10 helpful for anxiety. 2 courses of ECT with the last completed December 2019  Review of Systems:   No tremor.  No weakness. Started losing weight at gym. Working on [employer].  No tremor.  No confusion.  No headaches.  No balance problems.  No sedation.  Medications: I have reviewed the patient's current medications.  Current Outpatient Medications  Medication Sig Dispense Refill   hydrALAZINE (APRESOLINE) 50 MG tablet TAKE ONE TABLET BY MOUTH THREE TIMES A DAY 90 tablet 1   liothyronine (CYTOMEL) 25 MCG tablet TAKE TWO TABLETS BY MOUTH DAILY 180 tablet 0   lisinopril (ZESTRIL) 40 MG tablet TAKE ONE TABLET BY MOUTH DAILY 90 tablet 1   mirtazapine (REMERON) 45 MG tablet TAKE 1 TABLET BY MOUTH EVERY NIGHT AT BEDTIME 90 tablet 0   OLANZapine (ZYPREXA) 5 MG tablet TAKE ONE TABLET BY MOUTH EVERY NIGHT AT BEDTIME 90 tablet 0   risperiDONE (RISPERDAL) 1 MG tablet Take 1 tablet (1 mg total) by mouth at bedtime. 15 tablet 0   Semaglutide,0.25 or 0.5MG /DOS, (OZEMPIC, 0.25 OR 0.5 MG/DOSE,) 2 MG/1.5ML SOPN Inject 0.25 mg once weekly for 4 weeks. Then inject 0.5 mg per week for 2 weeks 1.5 mL 0   sildenafil (VIAGRA) 100 MG tablet Take 1 tablet (100 mg total) by mouth daily as needed. 90 tablet 1   venlafaxine XR (EFFEXOR-XR) 75 MG 24 hr capsule TAKE 3 CAPSULES BY MOUTH DAILY WITH BREAKFAST 270 capsule 0   OLANZapine-Samidorphan (LYBALVI) 5-10 MG TABS Take 1 tablet by mouth at bedtime. 90 tablet 1   No current facility-administered medications for this visit.     Medication Side Effects: None really noted.  Allergies:  Allergies  Allergen Reactions   Latex Rash    Past Medical History:  Diagnosis Date   Anxiety    Benzodiazepine withdrawal (HCC)    Chronic kidney disease    protein builds up   Depression    Heart murmur    Hypertension     IgA nephropathy    Sleep apnea    cpap    Family History  Problem Relation Age of Onset   Testicular cancer Father    Alcohol abuse Cousin    Sleep apnea Neg Hx     Social History   Socioeconomic History   Marital status: Divorced    Spouse name: Not on file   Number of children: 3   Years of education: Not on file   Highest education level: Professional school degree (e.g., MD, DDS, DVM, JD)  Occupational History   Not on file  Tobacco Use   Smoking status: Never   Smokeless tobacco: Never  Vaping Use   Vaping Use: Never used  Substance and Sexual Activity   Alcohol use: Yes    Comment: OCC   Drug use: Not Currently    Types: Marijuana    Comment: last used 3 to 4 years ago   Sexual activity: Yes    Birth control/protection: Other-see comments  Other Topics Concern   Not on file  Social History Narrative   Not on file   Social  Determinants of Health   Financial Resource Strain: Not on file  Food Insecurity: Not on file  Transportation Needs: Not on file  Physical Activity: Not on file  Stress: Not on file  Social Connections: Not on file  Intimate Partner Violence: Not on file    Past Medical History, Surgical history, Social history, and Family history were reviewed and updated as appropriate.   Please see review of systems for further details on the patient's review from today.   Objective:   Physical Exam:  There were no vitals taken for this visit.  Physical Exam Constitutional:      General: He is not in acute distress.    Appearance: He is well-developed. He is obese.  Musculoskeletal:        General: No deformity.  Neurological:     Mental Status: He is alert and oriented to person, place, and time.     Cranial Nerves: No dysarthria.     Coordination: Coordination normal.  Psychiatric:        Attention and Perception: Attention and perception normal. He does not perceive auditory or visual hallucinations.        Mood and Affect: Mood is  not anxious or depressed. Affect is not labile, blunt, angry or inappropriate.        Speech: Speech normal.        Behavior: Behavior normal. Behavior is cooperative.        Thought Content: Thought content normal. Thought content is not paranoid or delusional. Thought content does not include homicidal or suicidal ideation. Thought content does not include homicidal or suicidal plan.        Cognition and Memory: Cognition and memory normal.        Judgment: Judgment normal.     Comments: Positive with some humor. Insight good no evidence for psychosis    Lab Review:     Component Value Date/Time   NA 135 05/24/2020 1648   K 4.2 05/24/2020 1648   CL 100 05/24/2020 1648   CO2 21 (L) 05/24/2020 1648   GLUCOSE 77 05/24/2020 1648   BUN 15 05/24/2020 1648   CREATININE 1.18 05/24/2020 1648   CREATININE 1.82 (H) 11/03/2013 1613   CALCIUM 8.9 05/24/2020 1648   PROT 7.5 05/24/2020 1648   ALBUMIN 4.5 05/24/2020 1648   AST 157 (H) 05/24/2020 1648   ALT 115 (H) 05/24/2020 1648   ALKPHOS 78 05/24/2020 1648   BILITOT 0.7 05/24/2020 1648   GFRNONAA >60 05/24/2020 1648   GFRNONAA 44 (L) 11/03/2013 1613   GFRAA >60 01/09/2018 1513   GFRAA 51 (L) 11/03/2013 1613       Component Value Date/Time   WBC 8.9 01/09/2018 1513   RBC 5.29 01/09/2018 1513   HGB 16.2 01/09/2018 1513   HCT 50.2 01/09/2018 1513   PLT 257 01/09/2018 1513   MCV 94.9 01/09/2018 1513   MCV 90.6 11/03/2013 1613   MCH 30.6 01/09/2018 1513   MCHC 32.3 01/09/2018 1513   RDW 12.0 01/09/2018 1513   LYMPHSABS 1.9 05/30/2016 0409   MONOABS 1.3 (H) 05/30/2016 0409   EOSABS 0.4 05/30/2016 0409   BASOSABS 0.0 05/30/2016 0409    No results found for: POCLITH, LITHIUM   No results found for: PHENYTOIN, PHENOBARB, VALPROATE, CBMZ   .res Assessment: Plan:    Major depression, recurrent, full remission (HCC)  Weight gain due to medication - Plan: OLANZapine-Samidorphan (LYBALVI) 5-10 MG TABS  Social anxiety  disorder  Generalized anxiety disorder  Drug-induced  erectile dysfunction  Low serum vitamin D   Mr. Dondlinger has had a severe episode of depression with psychotic features and anxiety and cognitive impairment which has responded to ECT plus medication changes.  He  completed ECT.  He was markedly better and in remission at his visit on May 4.  At that time we discontinued olanzapine and he had relapsed severely.  So we restarted olanzapine 5 mg daily and increase venlafaxine XR to 225 mg daily.  SX resolved again with olanzapine added. At this point his symptoms of depression and anxiety are  resolved   Tolerating meds except for the weight gain.  Anxiety and depression are now well managed.  He enjoys his new job..  Discussed potential metabolic side effects associated with atypical antipsychotics, as well as potential risk for movement side effects. Advised pt to contact office if movement side effects occur.   Consider start lithium for prophylaxis, consider if he comes off Zyprexa. Disc risk of stopping Zyprexa. No SE. we both agree no change in that at this time.  Consider Ozempic for weight loss.  He could not get this due to the cost.  Trial Libalvi in place of olanzapine After appeal to the [facility] of insurance the insurance company's denial of coverage was overturned.  The patient now has access to Lybalvi and will start Lybalvi 5 mg in place of the olanzapine in order to help with weight loss.  Option [place] for EDO.  Not 100% sure if he actually has an eating disorder versus the effect of olanzapine except that he says he will eat even when he is not hungry.  He will consider therapy.  He agrees with the plan to continue venlafaxine XR to 25 mg daily and switch olanzapine to Lybalvi 5  FU 6 mos  Meredith Staggers, MD, DFAPA   Future Appointments  Date Time Provider Department Center  03/08/2021  3:15 PM Altamese Cabal, RD [facility]  07/25/2021  4:30 PM  Cottle, Steva Ready., MD [facility] None    No orders of the defined types were placed in this encounter.     -------------------------------

## 2021-01-26 ENCOUNTER — Other Ambulatory Visit: Payer: Self-pay | Admitting: Psychiatry

## 2021-03-03 ENCOUNTER — Other Ambulatory Visit: Payer: Self-pay | Admitting: Psychiatry

## 2021-03-03 DIAGNOSIS — F3342 Major depressive disorder, recurrent, in full remission: Secondary | ICD-10-CM

## 2021-03-08 ENCOUNTER — Ambulatory Visit: Payer: 59 | Admitting: Dietician

## 2021-03-09 ENCOUNTER — Encounter: Payer: Self-pay | Admitting: Neurology

## 2021-03-10 ENCOUNTER — Other Ambulatory Visit: Payer: Self-pay | Admitting: Psychiatry

## 2021-03-10 DIAGNOSIS — F3342 Major depressive disorder, recurrent, in full remission: Secondary | ICD-10-CM

## 2021-03-10 DIAGNOSIS — F4010 Social phobia, unspecified: Secondary | ICD-10-CM

## 2021-03-13 NOTE — Telephone Encounter (Signed)
Waiting on pt's insurance card so we can send to [facility], but I went ahead and sent the CPAP order for new machine, pt's demographics/contact info, office notes and sleep study to [facility] to request a transfer of care from [facility]. Received a receipt of confirmation.

## 2021-03-13 NOTE — Telephone Encounter (Signed)
Faxed insurance card copies to [facility]. Received a receipt of confirmation.

## 2021-03-27 ENCOUNTER — Other Ambulatory Visit: Payer: Self-pay | Admitting: Psychiatry

## 2021-04-25 ENCOUNTER — Encounter: Payer: Self-pay | Admitting: Neurology

## 2021-04-27 ENCOUNTER — Other Ambulatory Visit: Payer: Self-pay

## 2021-04-27 ENCOUNTER — Ambulatory Visit (INDEPENDENT_AMBULATORY_CARE_PROVIDER_SITE_OTHER): Payer: 59 | Admitting: Family Medicine

## 2021-04-27 ENCOUNTER — Encounter (HOSPITAL_BASED_OUTPATIENT_CLINIC_OR_DEPARTMENT_OTHER): Payer: Self-pay | Admitting: Family Medicine

## 2021-04-27 DIAGNOSIS — R000 Tachycardia, unspecified: Secondary | ICD-10-CM | POA: Diagnosis not present

## 2021-04-27 NOTE — Progress Notes (Signed)
? ? ?  Procedures performed today:   ? ?EKG: Rate of 93 bpm, sinus rhythm, normal QT, normal P wave, normal QRS.  Overall, reassuring EKG ? ?Independent interpretation of notes and tests performed by another provider:  ? ?None. ? ?Brief History, Exam, Impression, and Recommendations:   ? ?BP 116/76   Pulse (!) 105   Ht 6\' 5"  (1.956 m)   Wt (!) 316 lb 1.6 oz (143.4 kg)   SpO2 98%   BMI 37.48 kg/m?  ? ?Tachycardia ?Patient reports over the past 1 to 2 weeks he has been having relatively consistent tachycardia.  Initially, he was looking to donate blood and was told that his heart rate was elevated.  It did not come down with resting and he was not able to donate blood.  He has been monitoring at home and it has consistently been in the low 100s.  On review of chart, he has had borderline elevated pulse in the past, most recent office visit with pulse of 99.  He denies any associated chest pain, shortness of breath, chest tightness, dizziness, lightheadedness. ?On exam, borderline tachycardia, with regular rhythm, no murmur appreciated.  Lungs clear to auscultation bilaterally. ?Discussed possible etiologies including dehydration, medication side effect, sympathetic nervous system, arrhythmia.  Today we will check labs including TSH, CBC.  We will also check EKG.  Further recommendations will be made once results reviewed ?Need to consider medication adjustment.  Chart review indicates that he was found to have low TSH in the past with recommendation of decreasing dose of liothyronine, however patient indicates that he is still taking 2 tablets by mouth daily. ?May also need to consider referral to cardiology for further evaluation, particularly if laboratory evaluation and EKG are unremarkable ? ? ?___________________________________________ ?Kelly Johnston de [place], MD, ABFM, CAQSM ?Primary Care and Sports Medicine ?[place] ?

## 2021-04-27 NOTE — Assessment & Plan Note (Signed)
Patient reports over the past 1 to 2 weeks he has been having relatively consistent tachycardia.  Initially, he was looking to donate blood and was told that his heart rate was elevated.  It did not come down with resting and he was not able to donate blood.  He has been monitoring at home and it has consistently been in the low 100s.  On review of chart, he has had borderline elevated pulse in the past, most recent office visit with pulse of 99.  He denies any associated chest pain, shortness of breath, chest tightness, dizziness, lightheadedness. ?On exam, borderline tachycardia, with regular rhythm, no murmur appreciated.  Lungs clear to auscultation bilaterally. ?Discussed possible etiologies including dehydration, medication side effect, sympathetic nervous system, arrhythmia.  Today we will check labs including TSH, CBC.  We will also check EKG.  Further recommendations will be made once results reviewed ?Need to consider medication adjustment.  Chart review indicates that he was found to have low TSH in the past with recommendation of decreasing dose of liothyronine, however patient indicates that he is still taking 2 tablets by mouth daily. ?May also need to consider referral to cardiology for further evaluation, particularly if laboratory evaluation and EKG are unremarkable ?

## 2021-04-27 NOTE — Patient Instructions (Signed)
?  Medication Instructions:  ?Your physician recommends that you continue on your current medications as directed. Please refer to the Current Medication list given to you today. ?--If you need a refill on any your medications before your next appointment, please call your pharmacy first. If no refills are authorized on file call the office.-- ? ?Follow-Up: ?Your next appointment:   ?Your physician recommends that you schedule a follow-up appointment in: TBD with Dr. de [place] ? ?You will receive a text message or e-mail with a link to a survey about your care and experience with [place] today! We would greatly appreciate your feedback!  ? ?Thanks for letting us be apart of your health journey!!  ?Primary Care and Sports Medicine  ? ?Dr. Marcy Salvo de [place]  ? ?We encourage you to activate your patient portal called "MyChart".  Sign up information is provided on this After Visit Summary.  MyChart is used to connect with patients for Virtual Visits (Telemedicine).  Patients are able to view lab/test results, encounter notes, upcoming appointments, etc.  Non-urgent messages can be sent to your provider as well. To learn more about what you can do with MyChart, please visit --  [email or web address].   ? ?

## 2021-04-28 LAB — CBC WITH DIFFERENTIAL/PLATELET
Basophils Absolute: 0.1 10*3/uL (ref 0.0–0.2)
Basos: 1 %
EOS (ABSOLUTE): 0.1 10*3/uL (ref 0.0–0.4)
Eos: 2 %
Hematocrit: 41.8 % (ref 37.5–51.0)
Hemoglobin: 14.3 g/dL (ref 13.0–17.7)
Immature Grans (Abs): 0.1 10*3/uL (ref 0.0–0.1)
Immature Granulocytes: 1 %
Lymphocytes Absolute: 2 10*3/uL (ref 0.7–3.1)
Lymphs: 22 %
MCH: 31.2 pg (ref 26.6–33.0)
MCHC: 34.2 g/dL (ref 31.5–35.7)
MCV: 91 fL (ref 79–97)
Monocytes Absolute: 0.8 10*3/uL (ref 0.1–0.9)
Monocytes: 9 %
Neutrophils Absolute: 5.8 10*3/uL (ref 1.4–7.0)
Neutrophils: 65 %
Platelets: 320 10*3/uL (ref 150–450)
RBC: 4.59 x10E6/uL (ref 4.14–5.80)
RDW: 12.9 % (ref 11.6–15.4)
WBC: 8.8 10*3/uL (ref 3.4–10.8)

## 2021-04-28 LAB — TSH: TSH: 0.152 u[IU]/mL — ABNORMAL LOW (ref 0.450–4.500)

## 2021-05-22 ENCOUNTER — Encounter: Payer: Self-pay | Admitting: Adult Health

## 2021-05-23 ENCOUNTER — Encounter: Payer: Self-pay | Admitting: Adult Health

## 2021-05-23 ENCOUNTER — Ambulatory Visit: Payer: 59 | Admitting: Adult Health

## 2021-05-23 VITALS — BP 135/84 | HR 108 | Ht 77.0 in | Wt 327.0 lb

## 2021-05-23 DIAGNOSIS — G4733 Obstructive sleep apnea (adult) (pediatric): Secondary | ICD-10-CM | POA: Diagnosis not present

## 2021-05-23 DIAGNOSIS — Z9989 Dependence on other enabling machines and devices: Secondary | ICD-10-CM | POA: Diagnosis not present

## 2021-05-23 NOTE — Progress Notes (Signed)
?[facility] ?[id number] ?[place]. [place] [address] ?[phone] ? ?     OFFICE FOLLOW UP NOTE ? ?Mr. [name] ?Date of Birth:  [birth year] ?Medical Record Number:  [id number]  ? ?Reason for visit: Initial CPAP follow-up ? ? ? ?SUBJECTIVE: ? ? ?CHIEF COMPLAINT:  ?Chief Complaint  ?Patient presents with  ? Initial CPAP FU  ?  Rm 2, "no problems with CPAP"   ? ? ?HPI:  ? ?Update 05/23/2021 JM: Patient being seen for initial CPAP compliance visit.  He received a new CPAP machine with review of compliance report from 04/23/2021 -05/22/2021 which shows 30 out of 30 usage days with 30 days greater than 4 hours for 100% compliance.  Average usage 10 hours and 17 minutes with residual AHI 0.2.  Leaks in the 95 percentile 37.4.  Set pressure of 11 with EPR level 3.  Continues to tolerate CPAP well.  Current use of nasal pillows. He is not aware of any leaks but does report having red sad faces on his machine for leak report.  Epworth Sleepiness Scale 3/24.  Fatigue severity scale 25/63. ? ? ? ? ? ?Consult visit 11/30/2020 Dr. Frances Furbish (copied for reference purposes only) ?Mr. Adachi is a 51 year old right-handed gentleman with an underlying medical history of hypertension, IgA nephropathy, depression, anxiety, elevated liver enzymes, fatty liver, chronic kidney disease, and obesity, who carries a longstanding history of obstructive sleep apnea.  He has been on CPAP therapy for years.  I had evaluated him several years ago.  He has not been seen in this office in over 6 years.  I reviewed your office note from 06/22/2020.  His Epworth sleepiness score is 4 out of 24, fatigue severity score is 20 out of 63.  He reports full compliance with his CPAP machine and cannot sleep without it, takes it on any trips as well.  He has an Dentist, older CPAP machine model and reports that he has seen an error message on it stating that the motor life has been exceeded.  His previous DME company was [facility], he  has been using nasal pillows, medium Swift FX nasal pillows.  He has been getting his supplies online, does report he needs a replacement for his nasal pillows.  He has been on several medications for his mood disorder, he is followed by psychiatry.  He is currently on fairly high dose of Remeron at night, Effexor total dose of 225 mg daily and takes Risperdal 1 mg at bedtime, also takes Lybalvi at bedtime.  He is no longer on Ozempic.  Weight has been fluctuating.  He no longer works at [employer], he is now working as a Metallurgist.  His dad lives with him, he is 10 years old, he has 2 dogs in the household.  He does not drink caffeine on a daily basis, rare alcohol, is a non-smoker.  Bedtime is generally around 9 PM and rise time around 7:45 AM.  I was able to review his recent compliance data for the past 30 days, he was 100% compliant, I also reviewed his past 90-day compliance, he has been 100% compliant with percent use days greater than 4 hours also at 100%, average usage high at 10 hours and 23 minutes.  He reports that when he does not have his kids with him he tends to go to bed early and stay asleep longer.  His AHI on average is at goal at 0.3/h, leak on the higher  side with fluctuating leak noted, 95th percentile at 29.3 L/min on a pressure of 11 cm with EPR of 2.  His kids stay with him every other week, he has 3 teenagers. ?He has benefited greatly from CPAP therapy, in fact, feels that he cannot sleep without it.  He reports benefit regarding his sleep quality, sleep consolidation and daytime energy.  He does not have recurrent morning headaches or night to night nocturia. ? ? ? ? ? ?ROS:   ?14 system review of systems performed and negative with exception of those listed in HPI ? ?PMH:  ?Past Medical History:  ?Diagnosis Date  ? Anxiety   ? Benzodiazepine withdrawal (HCC)   ? Chronic kidney disease   ? protein builds up  ? Depression   ? Heart murmur   ? Hypertension   ? IgA nephropathy   ?  Sleep apnea   ? cpap  ? ? ?PSH:  ?Past Surgical History:  ?Procedure Laterality Date  ? chest surgery    ? INCISION AND DRAINAGE ABSCESS  01/16/2012  ? Procedure: INCISION AND DRAINAGE ABSCESS;  Surgeon: Wilmon Arms. Corliss Skains, MD;  Location: [facility];  Service: General;  Laterality: Left;  ? PECTUS EXCAVATUM REPAIR    ? ? ?Social History:  ?Social History  ? ?Socioeconomic History  ? Marital status: Divorced  ?  Spouse name: Not on file  ? Number of children: 3  ? Years of education: Not on file  ? Highest education level: Professional school degree (e.g., MD, DDS, DVM, JD)  ?Occupational History  ? Not on file  ?Tobacco Use  ? Smoking status: Never  ? Smokeless tobacco: Never  ?Vaping Use  ? Vaping Use: Never used  ?Substance and Sexual Activity  ? Alcohol use: Yes  ?  Comment: OCC  ? Drug use: Not Currently  ?  Types: Marijuana  ?  Comment: last used 3 to 4 years ago  ? Sexual activity: Yes  ?  Birth control/protection: Other-see comments  ?Other Topics Concern  ? Not on file  ?Social History Narrative  ? Not on file  ? ?Social Determinants of Health  ? ?Financial Resource Strain: Not on file  ?Food Insecurity: Not on file  ?Transportation Needs: Not on file  ?Physical Activity: Not on file  ?Stress: Not on file  ?Social Connections: Not on file  ?Intimate Partner Violence: Not on file  ? ? ?Family History:  ?Family History  ?Problem Relation Age of Onset  ? Testicular cancer Father   ? Alcohol abuse Cousin   ? Sleep apnea Neg Hx   ? ? ?Medications:   ?Current Outpatient Medications on File Prior to Visit  ?Medication Sig Dispense Refill  ? liothyronine (CYTOMEL) 25 MCG tablet TAKE TWO TABLETS BY MOUTH DAILY 180 tablet 0  ? lisinopril (ZESTRIL) 40 MG tablet TAKE ONE TABLET BY MOUTH DAILY 90 tablet 1  ? mirtazapine (REMERON) 45 MG tablet TAKE ONE TABLET BY MOUTH EVERY NIGHT AT BEDTIME 90 tablet 0  ? OLANZapine (ZYPREXA) 5 MG tablet Take 5 mg by mouth at bedtime.    ? sildenafil (VIAGRA) 100 MG tablet Take 1 tablet (100 mg  total) by mouth daily as needed. 90 tablet 1  ? venlafaxine XR (EFFEXOR-XR) 75 MG 24 hr capsule TAKE THREE CAPSULES BY MOUTH DAILY WITH BREAKFAST 270 capsule 0  ? ?No current facility-administered medications on file prior to visit.  ? ? ?Allergies:   ?Allergies  ?Allergen Reactions  ? Latex Rash  ? ? ? ? ?  OBJECTIVE: ? ?Physical Exam ? ?Vitals:  ? 05/23/21 1503  ?BP: 135/84  ?Pulse: (!) 108  ?Weight: (!) 327 lb (148.3 kg)  ?Height: 6\' 5"  (1.956 m)  ? ?Body mass index is 38.78 kg/m?Marland Kitchen. ?No results found. ? ? ?General: well developed, well nourished, very pleasant middle-age Caucasian male, seated, in no evident distress ?Head: head normocephalic and atraumatic.   ?Neck: supple with no carotid or supraclavicular bruits ?Cardiovascular: regular rate and rhythm, no murmurs ?Musculoskeletal: no deformity ?Skin:  no rash/petichiae ?Vascular:  Normal pulses all extremities ?  ?Neurologic Exam ?Mental Status: Awake and fully alert. Oriented to place and time. Recent and remote memory intact. Attention span, concentration and fund of knowledge appropriate. Mood and affect appropriate.  ?Cranial Nerves: Pupils equal, briskly reactive to light. Extraocular movements full without nystagmus. Visual fields full to confrontation. Hearing intact. Facial sensation intact. Face, tongue, palate moves normally and symmetrically.  ?Motor: Normal bulk and tone. Normal strength in all tested extremity muscles ?Sensory.: intact to touch , pinprick , position and vibratory sensation.  ?Coordination: Rapid alternating movements normal in all extremities. Finger-to-nose and heel-to-shin performed accurately bilaterally. ?Gait and Station: Arises from chair without difficulty. Stance is normal. Gait demonstrates normal stride length and balance without use of AD. Tandem walk and heel toe without difficulty.  ?Reflexes: 1+ and symmetric. Toes downgoing.  ? ? ? ? ? ? ? ?ASSESSMENT: Cydney Okhomas E Train is a 51 y.o. year old male with longstanding  history of sleep apnea on CPAP.  Recently received new CPAP machine and returns today for initial compliance visit.  ? ? ? ? ?PLAN: ? ?OSA on CPAP : Compliance report shows excellent compliance at 100% and o

## 2021-06-11 ENCOUNTER — Other Ambulatory Visit: Payer: Self-pay | Admitting: Psychiatry

## 2021-06-11 DIAGNOSIS — F3342 Major depressive disorder, recurrent, in full remission: Secondary | ICD-10-CM

## 2021-06-11 DIAGNOSIS — F4010 Social phobia, unspecified: Secondary | ICD-10-CM

## 2021-06-29 ENCOUNTER — Other Ambulatory Visit: Payer: Self-pay | Admitting: Psychiatry

## 2021-07-12 ENCOUNTER — Other Ambulatory Visit: Payer: Self-pay | Admitting: Psychiatry

## 2021-07-12 MED ORDER — LYBALVI 5-10 MG PO TABS: 1.0000 | ORAL_TABLET | Freq: Every day | ORAL | 1 refills | Status: DC

## 2021-07-12 NOTE — Telephone Encounter (Signed)
I am not sure why patient is still taking olanzapine.  We obtained approval for Lybalvi from the [facility] of insurance through appeal.  However I do not see that he has been taking Lybalvi.  Sent Lybalvi in place of olanzapine. ?

## 2021-07-19 ENCOUNTER — Other Ambulatory Visit: Payer: Self-pay | Admitting: Psychiatry

## 2021-07-19 NOTE — Telephone Encounter (Signed)
Was he able to get the Lybalvi?

## 2021-07-21 NOTE — Telephone Encounter (Signed)
Please call him and see if he was able to get Lybalvi?  If not we need to ok this RX until he can.  He has had SI without it.

## 2021-07-25 ENCOUNTER — Ambulatory Visit (INDEPENDENT_AMBULATORY_CARE_PROVIDER_SITE_OTHER): Payer: 59 | Admitting: Psychiatry

## 2021-07-25 ENCOUNTER — Encounter: Payer: Self-pay | Admitting: Psychiatry

## 2021-07-25 DIAGNOSIS — F3342 Major depressive disorder, recurrent, in full remission: Secondary | ICD-10-CM

## 2021-07-25 DIAGNOSIS — N522 Drug-induced erectile dysfunction: Secondary | ICD-10-CM

## 2021-07-25 DIAGNOSIS — T50905A Adverse effect of unspecified drugs, medicaments and biological substances, initial encounter: Secondary | ICD-10-CM

## 2021-07-25 DIAGNOSIS — F4010 Social phobia, unspecified: Secondary | ICD-10-CM | POA: Diagnosis not present

## 2021-07-25 DIAGNOSIS — F411 Generalized anxiety disorder: Secondary | ICD-10-CM

## 2021-07-25 DIAGNOSIS — R635 Abnormal weight gain: Secondary | ICD-10-CM | POA: Diagnosis not present

## 2021-07-25 MED ORDER — OLANZAPINE 5 MG PO TABS: 5.0000 mg | ORAL_TABLET | Freq: Every day | ORAL | 1 refills | Status: DC

## 2021-07-25 MED ORDER — VENLAFAXINE HCL ER 75 MG PO CP24: 225.0000 mg | ORAL_CAPSULE | Freq: Every day | ORAL | 1 refills | Status: DC

## 2021-07-25 MED ORDER — LIOTHYRONINE SODIUM 25 MCG PO TABS: 37.5000 ug | ORAL_TABLET | Freq: Every day | ORAL | 0 refills | Status: DC

## 2021-07-25 MED ORDER — MIRTAZAPINE 45 MG PO TABS: 45.0000 mg | ORAL_TABLET | Freq: Every day | ORAL | 1 refills | Status: DC

## 2021-07-25 NOTE — Progress Notes (Signed)
Kelly Johnston [id number] [birth year] 51 y.o.  Subjective:   Patient ID:  Kelly Johnston is a 51 y.o. (DOB [birth year]) male.  Chief Complaint:  Chief Complaint  Patient presents with   Follow-up   Depression    Kelly Johnston Follow-up of severe depression with psychosis, altered mental status and anxiety requiring ECT  Att visit Jun 30, 2018.  Was completely in remission on May 4 when seen.  The only change since then has been discontinuing olanzapine. We completed weaned off olanzapine and discontinued it.  At follow-up visit August 06, 2018 he had relapsed with depression and rumination and anxiety.  Venlafaxine was increased to 225 mg daily.  Olanzapine was restarted at 5 mg nightly.  visit in November 2020 his depression and anxiety symptoms had largely resolved.  There were no med changes.  05/20/19 appt with the following noted: Still OK with depression.  Nothing has gotten worse.  Able to be productive and enjoy things.  Anxiety is managed and Ativan does a little without se.  Pleased with meds without SE. "More normal".  Hopeless resolved.  Quick improvement so probably the olanzapine helped.  Residual depression and anxiety are the same and not insurmountable.  Tears resolved.  Sleep without   Initial and terminal insomnia.  Some worry over job and finances.  Conc is better.  Caffeine not after 5.  1 diet coke in afternoon.  No RLS.   Almost never using Ativan. 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 Plan: no med changes  11/19/2019 appointment with the following noted: Good. No issues with depression.  Anxiety OK. No Ativan used Patient reports stable mood and denies depressed or irritable moods.  Patient denies any recent difficulty with anxiety.  Patient denies difficulty with sleep initiation or maintenance. Denies appetite disturbance.  Patient reports that energy and motivation have been good.  Patient denies any difficulty with  concentration.  Patient denies any suicidal ideation. No SE concerns. Plan no med changes  05/23/2020 appointment with the following noted:  doing good without relapses and satisfied with meds. Happier with career choice.  Makes a difference with mental health.  Doing real estate appraisals.  Staying busy.  Anxiety goodl Patient reports stable mood and denies depressed or irritable moods.  Patient denies any recent difficulty with anxiety.  Patient denies difficulty with sleep initiation or maintenance. Denies appetite disturbance.  Patient reports that energy and motivation have been good.  Patient denies any difficulty with concentration.  Patient denies any suicidal ideation. Wonders if binge eating disorder.  Uncontrollable urge even if not hungry.  Goes to extreme.  Wonders if started when depressed.  01/25/21 appt noted: Still good without relapses of depression since here. Work is [employer]. Patient reports stable mood and denies depressed or irritable moods.  Patient denies any recent difficulty with anxiety.  Patient denies difficulty with sleep initiation or maintenance. Denies appetite disturbance.  Patient reports that energy and motivation have been good.  Patient denies any difficulty with concentration.  Patient denies any suicidal ideation. Satisfied with meds.  Disc Lybalvi approval. Tolerating the meds except for the weight gain.  He has not been taking Lybalvi yet.  We discussed the approval by [facility] of insurance Plan: Trial Libalvi in place of olanzapine After appeal to the [facility] of insurance the insurance company's denial of coverage was overturned.  The patient now has access to Lybalvi and will start  Lybalvi 5 mg in place of the olanzapine in order to help with weight loss.  07/25/2021 appointment with the following noted: Never got to take Lybalvi bc $4300/90 days. So been on the olanzapine 5 HS. Doing well.  Work is good, life and  family good. Better career fit for him. Patient reports stable mood and denies depressed or irritable moods.  Patient denies any recent difficulty with anxiety.  Patient denies difficulty with sleep initiation or maintenance. Denies appetite disturbance.  Patient reports that energy and motivation have been good.  Patient denies any difficulty with concentration.  Patient denies any suicidal ideation. Not much difference in appetite with Lybalvi when had samples. No concerns with meds.  He had a psych hosp at [facility] late 2019.  Admitted with hallucinations, delusions and memory problems and had accidentally stopped psych meds.  He had a mixture of symptoms that was diagnosed is perhaps delirium related to benzodiazepine withdrawal as well as depression with psychotic features having just had an ECT treatment.  He was hospitalized at [facility] from December 3 until December 5.    He completed ECT treatment .  Prior psychiatric meds lithium, venlafaxine XR150 , duloxetine 120, fluoxetine 60, Wellbutrin 450, Abilify, Viibryd, Cytomel, Rexulti 2 mg, mirtazapine 45 mg, olanzapine 10 helpful for anxiety. 2 courses of ECT with the last completed December 2019  Review of Systems:   No tremor.  No weakness. Started losing weight at gym. Working on [employer]. No palpitations No tremor.  No confusion.  No headaches.  No balance problems.  No sedation.  Medications: I have reviewed the patient's current medications.  Current Outpatient Medications  Medication Sig Dispense Refill   lisinopril (ZESTRIL) 40 MG tablet TAKE ONE TABLET BY MOUTH DAILY 90 tablet 1   sildenafil (VIAGRA) 100 MG tablet Take 1 tablet (100 mg total) by mouth daily as needed. 90 tablet 1   liothyronine (CYTOMEL) 25 MCG tablet Take 1.5 tablets (37.5 mcg total) by mouth daily. 135 tablet 0   mirtazapine (REMERON) 45 MG tablet Take 1 tablet (45 mg total) by mouth at bedtime. 90 tablet 1   OLANZapine (ZYPREXA) 5 MG tablet Take 1 tablet  (5 mg total) by mouth at bedtime. 90 tablet 1   venlafaxine XR (EFFEXOR-XR) 75 MG 24 hr capsule Take 3 capsules (225 mg total) by mouth daily with breakfast. 270 capsule 1   No current facility-administered medications for this visit.     Medication Side Effects: None really noted.  Allergies:  Allergies  Allergen Reactions   Latex Rash    Past Medical History:  Diagnosis Date   Anxiety    Benzodiazepine withdrawal (HCC)    Chronic kidney disease    protein builds up   Depression    Heart murmur    Hypertension    IgA nephropathy    Sleep apnea    cpap    Family History  Problem Relation Age of Onset   Testicular cancer Father    Alcohol abuse Cousin    Sleep apnea Neg Hx     Social History   Socioeconomic History   Marital status: Divorced    Spouse name: Not on file   Number of children: 3   Years of education: Not on file   Highest education level: Professional school degree (e.g., MD, DDS, DVM, JD)  Occupational History   Not on file  Tobacco Use   Smoking status: Never   Smokeless tobacco: Never  Vaping Use  Vaping Use: Never used  Substance and Sexual Activity   Alcohol use: Yes    Comment: OCC   Drug use: Not Currently    Types: Marijuana    Comment: last used 3 to 4 years ago   Sexual activity: Yes    Birth control/protection: Other-see comments  Other Topics Concern   Not on file  Social History Narrative   Not on file   Social Determinants of Health   Financial Resource Strain: Not on file  Food Insecurity: Not on file  Transportation Needs: Not on file  Physical Activity: Not on file  Stress: Not on file  Social Connections: Not on file  Intimate Partner Violence: Not on file    Past Medical History, Surgical history, Social history, and Family history were reviewed and updated as appropriate.   Please see review of systems for further details on the patient's review from today.   Objective:   Physical Exam:  There were no  vitals taken for this visit.  Physical Exam Constitutional:      General: He is not in acute distress.    Appearance: He is well-developed. He is obese.  Musculoskeletal:        General: No deformity.  Neurological:     Mental Status: He is alert and oriented to person, place, and time.     Cranial Nerves: No dysarthria.     Coordination: Coordination normal.  Psychiatric:        Attention and Perception: Attention and perception normal. He does not perceive auditory or visual hallucinations.        Mood and Affect: Mood is not anxious or depressed. Affect is not labile, blunt, angry or inappropriate.        Speech: Speech normal.        Behavior: Behavior normal. Behavior is cooperative.        Thought Content: Thought content normal. Thought content is not paranoid or delusional. Thought content does not include homicidal or suicidal ideation. Thought content does not include homicidal or suicidal plan.        Cognition and Memory: Cognition and memory normal.        Judgment: Judgment normal.     Comments: Positive with some humor. Insight good no evidence for psychosis    Lab Review:     Component Value Date/Time   NA 135 05/24/2020 1648   K 4.2 05/24/2020 1648   CL 100 05/24/2020 1648   CO2 21 (L) 05/24/2020 1648   GLUCOSE 77 05/24/2020 1648   BUN 15 05/24/2020 1648   CREATININE 1.18 05/24/2020 1648   CREATININE 1.82 (H) 11/03/2013 1613   CALCIUM 8.9 05/24/2020 1648   PROT 7.5 05/24/2020 1648   ALBUMIN 4.5 05/24/2020 1648   AST 157 (H) 05/24/2020 1648   ALT 115 (H) 05/24/2020 1648   ALKPHOS 78 05/24/2020 1648   BILITOT 0.7 05/24/2020 1648   GFRNONAA >60 05/24/2020 1648   GFRNONAA 44 (L) 11/03/2013 1613   GFRAA >60 01/09/2018 1513   GFRAA 51 (L) 11/03/2013 1613       Component Value Date/Time   WBC 8.8 04/27/2021 1511   WBC 8.9 01/09/2018 1513   RBC 4.59 04/27/2021 1511   RBC 5.29 01/09/2018 1513   HGB 14.3 04/27/2021 1511   HCT 41.8 04/27/2021 1511   PLT  320 04/27/2021 1511   MCV 91 04/27/2021 1511   MCH 31.2 04/27/2021 1511   MCH 30.6 01/09/2018 1513   MCHC 34.2 04/27/2021 1511  MCHC 32.3 01/09/2018 1513   RDW 12.9 04/27/2021 1511   LYMPHSABS 2.0 04/27/2021 1511   MONOABS 1.3 (H) 05/30/2016 0409   EOSABS 0.1 04/27/2021 1511   BASOSABS 0.1 04/27/2021 1511    No results found for: POCLITH, LITHIUM   No results found for: PHENYTOIN, PHENOBARB, VALPROATE, CBMZ   .res Assessment: Plan:    Major depression, recurrent, full remission (HCC) - Plan: OLANZapine (ZYPREXA) 5 MG tablet, mirtazapine (REMERON) 45 MG tablet, venlafaxine XR (EFFEXOR-XR) 75 MG 24 hr capsule, liothyronine (CYTOMEL) 25 MCG tablet  Weight gain due to medication  Social anxiety disorder - Plan: venlafaxine XR (EFFEXOR-XR) 75 MG 24 hr capsule  Generalized anxiety disorder  Drug-induced erectile dysfunction   Mr. Weekes has had a severe episode of depression with psychotic features and anxiety and cognitive impairment which has responded to ECT plus medication changes.  He  completed ECT.  He was markedly better and in remission at his visit on May 4.  At that time we discontinued olanzapine and he had relapsed severely.  So we restarted olanzapine 5 mg daily and increase venlafaxine XR to 225 mg daily.  SX resolved again with olanzapine added. At this point his symptoms of depression and anxiety are  resolved   Tolerating meds except for the weight gain.  Anxiety and depression are now well managed.  He enjoys his new job..  Discussed potential metabolic side effects associated with atypical antipsychotics, as well as potential risk for movement side effects. Advised pt to contact office if movement side effects occur.   Consider Ozempic for weight loss.  He could not get this due to the cost.  Trial Libalvi in place of olanzapine not helpful enough with appetite and weight using samples so will stick with olanzapine 5 mg nightly. Continue mirtazapine 45 mg  nightly Continue venlafaxine XR's 225 mg daily  TSH is suppressed at 0.152\in March 2023 given that he is been doing well for some time we can probably get by reducing the Cytomel.  We will reduce Cytomel from 50 mcg every morning to 37.5 mg nightly Call if there is any worsening of symptoms  Option Ed Lurey for EDO.  Not 100% sure if he actually has an eating disorder versus the effect of olanzapine except that he says he will eat even when he is not hungry.  He will consider therapy.  He agrees with the plan to continue venlafaxine XR to 25 mg daily and switch olanzapine to Lybalvi 5  FU 6 mos  Meredith Staggers, MD, DFAPA   Future Appointments  Date Time Provider Department Center  01/25/2022  3:30 PM Cottle, Steva Ready., MD [facility] None  05/24/2022  3:15 PM Ihor Austin, NP [facility] None    No orders of the defined types were placed in this encounter.     -------------------------------

## 2021-07-28 ENCOUNTER — Other Ambulatory Visit: Payer: Self-pay | Admitting: Psychiatry

## 2021-08-07 ENCOUNTER — Ambulatory Visit (INDEPENDENT_AMBULATORY_CARE_PROVIDER_SITE_OTHER): Payer: 59 | Admitting: Orthopaedic Surgery

## 2021-08-07 ENCOUNTER — Ambulatory Visit (INDEPENDENT_AMBULATORY_CARE_PROVIDER_SITE_OTHER): Payer: 59

## 2021-08-07 DIAGNOSIS — M25561 Pain in right knee: Secondary | ICD-10-CM

## 2021-08-07 DIAGNOSIS — M7631 Iliotibial band syndrome, right leg: Secondary | ICD-10-CM

## 2021-08-07 NOTE — Progress Notes (Signed)
Chief Complaint: Right knee pain     History of Present Illness:    Kelly Johnston is a 51 y.o. male presents with right knee pain which is occurred starting May 25 when he experienced a sharp pain about the lateral aspect of the knee.  This was not responsive any specific injury.  This was bothering him for a period of 2 weeks.  This had eventually resolved on its own.  This felt like a deep bruise.  Denies any popping or snapping.  Denies any history of gout or any type of inflammatory arthropathies.  Here today for further assessment.    Surgical History:   None  PMH/PSH/Family History/Social History/Meds/Allergies:    Past Medical History:  Diagnosis Date   Anxiety    Benzodiazepine withdrawal (HCC)    Chronic kidney disease    protein builds up   Depression    Heart murmur    Hypertension    IgA nephropathy    Sleep apnea    cpap   Past Surgical History:  Procedure Laterality Date   chest surgery     INCISION AND DRAINAGE ABSCESS  01/16/2012   Procedure: INCISION AND DRAINAGE ABSCESS;  Surgeon: Wilmon Arms. Corliss Skains, MD;  Location: [facility];  Service: General;  Laterality: Left;   PECTUS EXCAVATUM REPAIR     Social History   Socioeconomic History   Marital status: Divorced    Spouse name: Not on file   Number of children: 3   Years of education: Not on file   Highest education level: Professional school degree (e.g., MD, DDS, DVM, JD)  Occupational History   Not on file  Tobacco Use   Smoking status: Never   Smokeless tobacco: Never  Vaping Use   Vaping Use: Never used  Substance and Sexual Activity   Alcohol use: Yes    Comment: OCC   Drug use: Not Currently    Types: Marijuana    Comment: last used 3 to 4 years ago   Sexual activity: Yes    Birth control/protection: Other-see comments  Other Topics Concern   Not on file  Social History Narrative   Not on file   Social Determinants of Health   Financial Resource  Strain: Low Risk  (01/06/2018)   Overall Financial Resource Strain (CARDIA)    Difficulty of Paying Living Expenses: Not hard at all  Food Insecurity: No Food Insecurity (01/06/2018)   Hunger Vital Sign    Worried About Running Out of Food in the Last Year: Never true    Ran Out of Food in the Last Year: Never true  Transportation Needs: No Transportation Needs (01/06/2018)   PRAPARE - Administrator, Civil Service (Medical): No    Lack of Transportation (Non-Medical): No  Physical Activity: Inactive (01/06/2018)   Exercise Vital Sign    Days of Exercise per Week: 0 days    Minutes of Exercise per Session: 0 min  Stress: Stress Concern Present (01/06/2018)   [name]    Feeling of Stress : Very much  Social Connections: Unknown (01/06/2018)   Social Connection and Isolation Panel [NHANES]    Frequency of Communication with Friends and Family: Not on file    Frequency of Social Gatherings with Friends and Family: Not  on file    Attends Religious Services: Never    Active Member of Clubs or Organizations: No    Attends Banker Meetings: Never    Marital Status: Divorced   Family History  Problem Relation Age of Onset   Testicular cancer Father    Alcohol abuse Cousin    Sleep apnea Neg Hx    Allergies  Allergen Reactions   Latex Rash   Current Outpatient Medications  Medication Sig Dispense Refill   liothyronine (CYTOMEL) 25 MCG tablet Take 1.5 tablets (37.5 mcg total) by mouth daily. 135 tablet 0   lisinopril (ZESTRIL) 40 MG tablet TAKE ONE TABLET BY MOUTH DAILY 90 tablet 1   mirtazapine (REMERON) 45 MG tablet Take 1 tablet (45 mg total) by mouth at bedtime. 90 tablet 1   OLANZapine (ZYPREXA) 5 MG tablet Take 1 tablet (5 mg total) by mouth at bedtime. 90 tablet 1   sildenafil (VIAGRA) 100 MG tablet Take 1 tablet (100 mg total) by mouth daily as needed. 90 tablet 1   venlafaxine XR  (EFFEXOR-XR) 75 MG 24 hr capsule Take 3 capsules (225 mg total) by mouth daily with breakfast. 270 capsule 1   No current facility-administered medications for this visit.   No results found.  Review of Systems:   A ROS was performed including pertinent positives and negatives as documented in the HPI.  Physical Exam :   Constitutional: NAD and appears stated age Neurological: Alert and oriented Psych: Appropriate affect and cooperative There were no vitals taken for this visit.   Comprehensive Musculoskeletal Exam:      Musculoskeletal Exam  Gait Normal  Alignment Normal   Right Left  Inspection Normal Normal  Palpation    Tenderness None None  Crepitus None None  Effusion None None  Range of Motion    Extension 0 0  Flexion 135 135  Strength    Extension 5/5 5/5  Flexion 5/5 5/5  Ligament Exam     Generalized Laxity No No  Lachman Negative Negative   Pivot Shift Negative Negative  Anterior Drawer Negative Negative  Valgus at 0 Negative Negative  Valgus at 20 Negative Negative  Varus at 0 0 0  Varus at 20   0 0  Posterior Drawer at 90 0 0  Vascular/Lymphatic Exam    Edema None None  Venous Stasis Changes No No  Distal Circulation Normal Normal  Neurologic    Light Touch Sensation Intact Intact  Special Tests:      Imaging:   Xray (4 views right knee): Normal   I personally reviewed and interpreted the radiographs.   Assessment:   51 y.o. male with right knee pain consistent with lateral IT band friction syndrome.  I have described that ultimately we could perform an injection in the future if needed.  His pain is resolved at this time is a seeking additional treatment.  I will see him back on an as-needed basis  Plan :    -Return to clinic as needed     I personally saw and evaluated the patient, and participated in the management and treatment plan.  Huel Cote, MD Attending Physician, Orthopedic Surgery  This document was dictated  using Dragon voice recognition software. A reasonable attempt at proof reading has been made to minimize errors.

## 2022-01-21 ENCOUNTER — Other Ambulatory Visit: Payer: Self-pay | Admitting: Psychiatry

## 2022-01-22 NOTE — Telephone Encounter (Signed)
Ok to send

## 2022-01-25 ENCOUNTER — Ambulatory Visit (INDEPENDENT_AMBULATORY_CARE_PROVIDER_SITE_OTHER): Payer: Self-pay | Admitting: Psychiatry

## 2022-01-25 ENCOUNTER — Encounter: Payer: Self-pay | Admitting: Psychiatry

## 2022-01-25 VITALS — BP 126/83 | HR 100

## 2022-01-25 DIAGNOSIS — F411 Generalized anxiety disorder: Secondary | ICD-10-CM

## 2022-01-25 DIAGNOSIS — T50905A Adverse effect of unspecified drugs, medicaments and biological substances, initial encounter: Secondary | ICD-10-CM

## 2022-01-25 DIAGNOSIS — F4010 Social phobia, unspecified: Secondary | ICD-10-CM

## 2022-01-25 DIAGNOSIS — N522 Drug-induced erectile dysfunction: Secondary | ICD-10-CM

## 2022-01-25 DIAGNOSIS — R635 Abnormal weight gain: Secondary | ICD-10-CM

## 2022-01-25 DIAGNOSIS — F3342 Major depressive disorder, recurrent, in full remission: Secondary | ICD-10-CM

## 2022-01-25 MED ORDER — VENLAFAXINE HCL ER 75 MG PO CP24: 225.0000 mg | ORAL_CAPSULE | Freq: Every day | ORAL | 2 refills | Status: DC

## 2022-01-25 MED ORDER — OLANZAPINE 5 MG PO TABS: 5.0000 mg | ORAL_TABLET | Freq: Every day | ORAL | 2 refills | Status: DC

## 2022-01-25 MED ORDER — LISINOPRIL 40 MG PO TABS: 40.0000 mg | ORAL_TABLET | Freq: Every day | ORAL | 2 refills | Status: DC

## 2022-01-25 MED ORDER — LIOTHYRONINE SODIUM 25 MCG PO TABS: 25.0000 ug | ORAL_TABLET | Freq: Every day | ORAL | 2 refills | Status: DC

## 2022-01-25 MED ORDER — MIRTAZAPINE 45 MG PO TABS: 45.0000 mg | ORAL_TABLET | Freq: Every day | ORAL | 2 refills | Status: DC

## 2022-01-25 NOTE — Progress Notes (Addendum)
Kelly Johnston [id number] 10-05-1970 51 y.o.  Subjective:   Patient ID:  Kelly Johnston is a 51 y.o. (DOB [birth year]) male.  Chief Complaint:  Chief Complaint  Patient presents with   Follow-up   Depression   Anxiety    [name] Follow-up of severe depression with psychosis, altered mental status and anxiety requiring ECT  Att visit Jun 30, 2018.  Was completely in remission on May 4 when seen.  The only change since then has been discontinuing olanzapine. We completed weaned off olanzapine and discontinued it.  At follow-up visit August 06, 2018 he had relapsed with depression and rumination and anxiety.  Venlafaxine was increased to 225 mg daily.  Olanzapine was restarted at 5 mg nightly.  visit in November 2020 his depression and anxiety symptoms had largely resolved.  There were no med changes.  05/20/19 appt with the following noted: Still OK with depression.  Nothing has gotten worse.  Able to be productive and enjoy things.  Anxiety is managed and Ativan does a little without se.  Pleased with meds without SE. "More normal".  Hopeless resolved.  Quick improvement so probably the olanzapine helped.  Residual depression and anxiety are the same and not insurmountable.  Tears resolved.  Sleep without   Initial and terminal insomnia.  Some worry over job and finances.  Conc is better.  Caffeine not after 5.  1 diet coke in afternoon.  No RLS.   Almost never using Ativan. 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 Plan: no med changes  11/19/2019 appointment with the following noted: Good. No issues with depression.  Anxiety OK. No Ativan used Patient reports stable mood and denies depressed or irritable moods.  Patient denies any recent difficulty with anxiety.  Patient denies difficulty with sleep initiation or maintenance. Denies appetite disturbance.  Patient reports that energy and motivation have been good.  Patient denies any difficulty with  concentration.  Patient denies any suicidal ideation. No SE concerns. Plan no med changes  05/23/2020 appointment with the following noted:  doing good without relapses and satisfied with meds. Happier with career choice.  Makes a difference with mental health.  Doing real estate appraisals.  Staying busy.  Anxiety goodl Patient reports stable mood and denies depressed or irritable moods.  Patient denies any recent difficulty with anxiety.  Patient denies difficulty with sleep initiation or maintenance. Denies appetite disturbance.  Patient reports that energy and motivation have been good.  Patient denies any difficulty with concentration.  Patient denies any suicidal ideation. Wonders if binge eating disorder.  Uncontrollable urge even if not hungry.  Goes to extreme.  Wonders if started when depressed.  01/25/21 appt noted: Still good without relapses of depression since here. Work is Engineer, manufacturing. Patient reports stable mood and denies depressed or irritable moods.  Patient denies any recent difficulty with anxiety.  Patient denies difficulty with sleep initiation or maintenance. Denies appetite disturbance.  Patient reports that energy and motivation have been good.  Patient denies any difficulty with concentration.  Patient denies any suicidal ideation. Satisfied with meds.  Disc Lybalvi approval. Tolerating the meds except for the weight gain.  He has not been taking Lybalvi yet.  We discussed the approval by the [facility] Department of insurance Plan: Trial Libalvi in place of olanzapine After appeal to the [facility] of insurance the insurance company's denial of coverage was overturned.  The patient now has access to [facility]  and will start Lybalvi 5 mg in place of the olanzapine in order to help with weight loss.  07/25/2021 appointment with the following noted: Never got to take Lybalvi bc $4300/90 days. So been on the olanzapine 5 HS. Doing well.  Work is good, life and  family good. Better career fit for him. Patient reports stable mood and denies depressed or irritable moods.  Patient denies any recent difficulty with anxiety.  Patient denies difficulty with sleep initiation or maintenance. Denies appetite disturbance.  Patient reports that energy and motivation have been good.  Patient denies any difficulty with concentration.  Patient denies any suicidal ideation. Not much difference in appetite with Lybalvi when had samples. No concerns with meds. Plan: Trial Libalvi in place of olanzapine not helpful enough with appetite and weight using samples so will stick with olanzapine 5 mg nightly. Continue mirtazapine 45 mg nightly Continue venlafaxine XR's 225 mg daily TSH is suppressed at 0.152\in March 2023 given that he is been doing well for some time we can probably get by reducing the Cytomel.  We will reduce Cytomel from 50 mcg every morning to 37.5 mg nightly  01/25/22 appt noted:  Son at [employer] playing bball. Doing great.  Everything going well.  No depressive episodes since here.  Anxiety wihtout a problem.  8-10 hours sleep. No concerns with meds. No problems with reducing Cytomel to 25 mcg AM. Health is fine. No changes desired. Work is pretty good and enjoys it and still fairly busy.  Appraising.   He had a psych hosp at [facility] late 2019.  Admitted with hallucinations, delusions and memory problems and had accidentally stopped psych meds.  He had a mixture of symptoms that was diagnosed is perhaps delirium related to benzodiazepine withdrawal as well as depression with psychotic features having just had an ECT treatment.  He was hospitalized at [facility] from December 3 until December 5.    He completed ECT treatment .  Prior psychiatric meds lithium, venlafaxine XR150 , duloxetine 120, fluoxetine 60, Wellbutrin 450, Abilify, Viibryd, Cytomel, Rexulti 2 mg, mirtazapine 45 mg, olanzapine 10 helpful for anxiety. 2 courses of ECT with the  last completed December 2019  Review of Systems:   No tremor.  No weakness. Started losing weight at gym. Working on [employer]. No palpitations No tremor.  No confusion.  No headaches.  No balance problems.  No sedation.  Medications: I have reviewed the patient's current medications.  Current Outpatient Medications  Medication Sig Dispense Refill   sildenafil (VIAGRA) 100 MG tablet Take 1 tablet (100 mg total) by mouth daily as needed. 90 tablet 1   liothyronine (CYTOMEL) 25 MCG tablet Take 1 tablet (25 mcg total) by mouth daily. 90 tablet 2   lisinopril (ZESTRIL) 40 MG tablet Take 1 tablet (40 mg total) by mouth daily. 90 tablet 2   mirtazapine (REMERON) 45 MG tablet Take 1 tablet (45 mg total) by mouth at bedtime. 90 tablet 2   OLANZapine (ZYPREXA) 5 MG tablet Take 1 tablet (5 mg total) by mouth at bedtime. 90 tablet 2   venlafaxine XR (EFFEXOR-XR) 75 MG 24 hr capsule Take 3 capsules (225 mg total) by mouth daily with breakfast. 270 capsule 2   No current facility-administered medications for this visit.     Medication Side Effects: None really noted.  Allergies:  Allergies  Allergen Reactions   Latex Rash    Past Medical History:  Diagnosis Date   Anxiety    Benzodiazepine  withdrawal ([place])    Chronic kidney disease    protein builds up   Depression    Heart murmur    Hypertension    IgA nephropathy    Sleep apnea    cpap    Family History  Problem Relation Age of Onset   Testicular cancer Father    Alcohol abuse Cousin    Sleep apnea Neg Hx     Social History   Socioeconomic History   Marital status: Divorced    Spouse name: Not on file   Number of children: 3   Years of education: Not on file   Highest education level: Professional school degree (e.g., MD, DDS, DVM, JD)  Occupational History   Not on file  Tobacco Use   Smoking status: Never   Smokeless tobacco: Never  Vaping Use   Vaping Use: Never used  Substance and Sexual Activity   Alcohol use:  Yes    Comment: OCC   Drug use: Not Currently    Types: Marijuana    Comment: last used 3 to 4 years ago   Sexual activity: Yes    Birth control/protection: Other-see comments  Other Topics Concern   Not on file  Social History Narrative   Not on file   Social Determinants of Health   Financial Resource Strain: Low Risk  (01/06/2018)   Overall Financial Resource Strain (CARDIA)    Difficulty of Paying Living Expenses: Not hard at all  Food Insecurity: No Food Insecurity (01/06/2018)   Hunger Vital Sign    Worried About Running Out of Food in the Last Year: Never true    [place] in the Last Year: Never true  Transportation Needs: No Transportation Needs (01/06/2018)   PRAPARE - Hydrologist (Medical): No    Lack of Transportation (Non-Medical): No  Physical Activity: Inactive (01/06/2018)   Exercise Vital Sign    Days of Exercise per Week: 0 days    Minutes of Exercise per Session: 0 min  Stress: Stress Concern Present (01/06/2018)   [place]    Feeling of Stress : Very much  Social Connections: Unknown (01/06/2018)   Social Connection and Isolation Panel [NHANES]    Frequency of Communication with Friends and Family: Not on file    Frequency of Social Gatherings with Friends and Family: Not on file    Attends Religious Services: Never    Active Member of Clubs or Organizations: No    Attends Archivist Meetings: Never    Marital Status: Divorced  Human resources officer Violence: Not At Risk (01/06/2018)   Humiliation, Afraid, Rape, and Kick questionnaire    Fear of Current or Ex-Partner: No    Emotionally Abused: No    Physically Abused: No    Sexually Abused: No    Past Medical History, Surgical history, Social history, and Family history were reviewed and updated as appropriate.   Please see review of systems for further details on the patient's review from  today.   Objective:   Physical Exam:  BP 126/83   Pulse 100   Physical Exam Constitutional:      General: He is not in acute distress.    Appearance: He is well-developed. He is obese.  Musculoskeletal:        General: No deformity.  Neurological:     Mental Status: He is alert and oriented to person, place, and time.  Cranial Nerves: No dysarthria.     Coordination: Coordination normal.  Psychiatric:        Attention and Perception: Attention and perception normal. He does not perceive auditory or visual hallucinations.        Mood and Affect: Mood is not anxious or depressed. Affect is not labile, blunt, angry or inappropriate.        Speech: Speech normal.        Behavior: Behavior normal. Behavior is cooperative.        Thought Content: Thought content normal. Thought content is not paranoid or delusional. Thought content does not include homicidal or suicidal ideation. Thought content does not include homicidal or suicidal plan.        Cognition and Memory: Cognition and memory normal.        Judgment: Judgment normal.     Comments: Positive with some humor. Insight good no evidence for psychosis     Lab Review:     Component Value Date/Time   NA 135 05/24/2020 1648   K 4.2 05/24/2020 1648   CL 100 05/24/2020 1648   CO2 21 (L) 05/24/2020 1648   GLUCOSE 77 05/24/2020 1648   BUN 15 05/24/2020 1648   CREATININE 1.18 05/24/2020 1648   CREATININE 1.82 (H) 11/03/2013 1613   CALCIUM 8.9 05/24/2020 1648   PROT 7.5 05/24/2020 1648   ALBUMIN 4.5 05/24/2020 1648   AST 157 (H) 05/24/2020 1648   ALT 115 (H) 05/24/2020 1648   ALKPHOS 78 05/24/2020 1648   BILITOT 0.7 05/24/2020 1648   GFRNONAA >60 05/24/2020 1648   GFRNONAA 44 (L) 11/03/2013 1613   GFRAA >60 01/09/2018 1513   GFRAA 51 (L) 11/03/2013 1613       Component Value Date/Time   WBC 8.8 04/27/2021 1511   WBC 8.9 01/09/2018 1513   RBC 4.59 04/27/2021 1511   RBC 5.29 01/09/2018 1513   HGB 14.3 04/27/2021  1511   HCT 41.8 04/27/2021 1511   PLT 320 04/27/2021 1511   MCV 91 04/27/2021 1511   MCH 31.2 04/27/2021 1511   MCH 30.6 01/09/2018 1513   MCHC 34.2 04/27/2021 1511   MCHC 32.3 01/09/2018 1513   RDW 12.9 04/27/2021 1511   LYMPHSABS 2.0 04/27/2021 1511   MONOABS 1.3 (H) 05/30/2016 0409   EOSABS 0.1 04/27/2021 1511   BASOSABS 0.1 04/27/2021 1511    No results found for: "POCLITH", "LITHIUM"   No results found for: "PHENYTOIN", "PHENOBARB", "VALPROATE", "CBMZ"   .res Assessment: Plan:    Major depression, recurrent, full remission (HCC) - Plan: liothyronine (CYTOMEL) 25 MCG tablet, mirtazapine (REMERON) 45 MG tablet, OLANZapine (ZYPREXA) 5 MG tablet, venlafaxine XR (EFFEXOR-XR) 75 MG 24 hr capsule  Weight gain due to medication  Social anxiety disorder - Plan: venlafaxine XR (EFFEXOR-XR) 75 MG 24 hr capsule  Generalized anxiety disorder  Drug-induced erectile dysfunction   Mr. Hevener has had a severe episode of depression with psychotic features and anxiety and cognitive impairment which has responded to ECT plus medication changes.  He  completed ECT.  He was markedly better and in remission at his visit on May 4.  At that time we discontinued olanzapine and he had relapsed severely.  So we restarted olanzapine 5 mg daily and increase venlafaxine XR to 225 mg daily.  SX resolved again with olanzapine added. At this point his symptoms of depression and anxiety are  resolved   Tolerating meds except for the weight gain.  Anxiety and depression are now well managed.  He enjoys his new job..  Discussed potential metabolic side effects associated with atypical antipsychotics, as well as potential risk for movement side effects. Advised pt to contact office if movement side effects occur.   Consider Ozempic for weight loss.  He could not get this due to the cost.  Trial Libalvi in place of olanzapine not helpful enough with appetite and weight using samples so will stick with  olanzapine 5 mg nightly. Continue mirtazapine 45 mg nightly Continue venlafaxine XR's 225 mg daily He asks we refill lisinopril.  BP is good.  TSH is suppressed at 0.152\in March 2023 given that he is been doing well for some time we can probably get by reducing the Cytomel.  We will reduce Cytomel from 50 mcg every morning to 37.5 mg nightly Call if there is any worsening of symptoms  Option Ed Lurey for EDO.  Not 100% sure if he actually has an eating disorder versus the effect of olanzapine except that he says he will eat even when he is not hungry.  He will consider therapy.  He agrees with the plan to continue venlafaxine XR to 25 mg daily and switch olanzapine to Lybalvi 5  FU 6 mos  Lynder Parents, MD, DFAPA   Future Appointments  Date Time Provider [place]  05/24/2022  3:15 PM Frann Rider, NP [facility] None    No orders of the defined types were placed in this encounter.     -------------------------------

## 2022-02-12 ENCOUNTER — Other Ambulatory Visit: Payer: Self-pay | Admitting: Psychiatry

## 2022-02-12 DIAGNOSIS — F3342 Major depressive disorder, recurrent, in full remission: Secondary | ICD-10-CM

## 2022-03-02 ENCOUNTER — Ambulatory Visit (HOSPITAL_BASED_OUTPATIENT_CLINIC_OR_DEPARTMENT_OTHER): Payer: Self-pay | Admitting: Orthopaedic Surgery

## 2022-04-17 ENCOUNTER — Encounter (HOSPITAL_BASED_OUTPATIENT_CLINIC_OR_DEPARTMENT_OTHER): Payer: Self-pay | Admitting: Family Medicine

## 2022-04-18 ENCOUNTER — Encounter: Payer: Self-pay | Admitting: Gastroenterology

## 2022-05-02 ENCOUNTER — Ambulatory Visit (HOSPITAL_BASED_OUTPATIENT_CLINIC_OR_DEPARTMENT_OTHER): Payer: 59 | Admitting: Orthopaedic Surgery

## 2022-05-07 NOTE — Progress Notes (Unsigned)
Referring Provider: de [place], Blondell Reveal, MD Primary Care Physician:  de [place], Blondell Reveal, MD  Reason for Consultation: Colon cancer screening   IMPRESSION:  Need for colon cancer screening. Colonoscopy recommended.  Chronic diarrhea. No alarm features. He does not feel that more evaluation is needed at this time.   Elevated transaminases in 2022. Will repeat today to confirm prior to proceeding with additional evaluation.   Fatty liver on ultrasound. ? Early cirrhosis.   PLAN: - Hepatic function panel - Consider additional testing/evaluation in the future - Screening colonoscopy to be scheduled at this convenience - Consider further evaluation of diarrhea in the future with alarm features  HPI: Kelly Johnston is a 52 y.o. male referred by Dr. Barbarann Ehlers who above for further evaluation of colon cancer screening. The history is obtained through the patient and review of his electronic health record.  He has a history of anxiety, depression, obesity, and sleep apnea.  He is a Clinical cytogeneticist.   No prior colonoscopy or colon cancer screening.   He has a lifetime history of diarrhea - with explosive, liquid bowel movements at least daily. Salads particularly trigger the diarrhea. More noticeable over the last 5-6 years. No abdominal pain or cramping.  No blood or mucous in the stool. He has not needed to take medications. Pepto does not provide relief. He has been anxious about using Imodium.  Abdominal ultrasound 05/31/2020: Fatty liver with probable early cirrhosis Normal CBC 04/27/2021 with a platelet count of 320 Transaminases were an AST of 157 and an ALT of 115 in March 2022  There is no known family history of colon cancer or polyps. No family history of stomach cancer or other GI malignancy. No family history of inflammatory bowel disease or celiac.    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 Tsuei, MD;  Location: [facility];  Service: General;  Laterality: Left;   PECTUS EXCAVATUM REPAIR     Current Outpatient Medications  Medication Sig Dispense Refill   liothyronine (CYTOMEL) 25 MCG tablet TAKE 1 AND 1/2 TABLET BY MOUTH DAILY 135 tablet 3   lisinopril (ZESTRIL) 40 MG tablet Take 1 tablet (40 mg total) by mouth daily. 90 tablet 2   mirtazapine (REMERON) 45 MG tablet Take 1 tablet (45 mg total) by mouth at bedtime. 90 tablet 2   OLANZapine (ZYPREXA) 5 MG tablet Take 1 tablet (5 mg total) by mouth at bedtime. 90 tablet 2   sildenafil (VIAGRA) 100 MG tablet Take 1 tablet (100 mg total) by mouth daily as needed. 90 tablet 1   venlafaxine XR (EFFEXOR-XR) 75 MG 24 hr capsule Take 3 capsules (225 mg total) by mouth daily with breakfast. 270 capsule 2   No current facility-administered medications for this visit.    Allergies as of 05/08/2022 - Review Complete 05/08/2022  Allergen Reaction Noted   Latex Rash 05/28/2016    Family History  Problem Relation Age of Onset   Testicular cancer Father    Alcohol abuse Cousin  Sleep apnea Neg Hx     Social History   Socioeconomic History   Marital status: Divorced    Spouse name: Not on file   Number of children: 3   Years of education: Not on file   Highest education level: Professional school degree (e.g., MD, DDS, DVM, JD)  Occupational History   Not on file  Tobacco Use   Smoking status: Never   Smokeless tobacco: Never  Vaping Use   Vaping Use: Never used  Substance and Sexual Activity   Alcohol use: Yes    Comment: OCC   Drug use: Not Currently    Types: Marijuana    Comment: last used 3 to 4 years ago   Sexual activity: Yes    Birth  control/protection: Other-see comments  Other Topics Concern   Not on file  Social History Narrative   Not on file   Social Determinants of Health   Financial Resource Strain: Low Risk  (01/06/2018)   Overall Financial Resource Strain (CARDIA)    Difficulty of Paying Living Expenses: Not hard at all  Food Insecurity: No Food Insecurity (01/06/2018)   Hunger Vital Sign    Worried About Running Out of Food in the Last Year: Never true    [place] in the Last Year: Never true  Transportation Needs: No Transportation Needs (01/06/2018)   PRAPARE - Hydrologist (Medical): No    Lack of Transportation (Non-Medical): No  Physical Activity: Inactive (01/06/2018)   Exercise Vital Sign    Days of Exercise per Week: 0 days    Minutes of Exercise per Session: 0 min  Stress: Stress Concern Present (01/06/2018)   [place]    Feeling of Stress : Very much  Social Connections: Unknown (01/06/2018)   Social Connection and Isolation Panel [NHANES]    Frequency of Communication with Friends and Family: Not on file    Frequency of Social Gatherings with Friends and Family: Not on file    Attends Religious Services: Never    Active Member of Clubs or Organizations: No    Attends Archivist Meetings: Never    Marital Status: Divorced  Human resources officer Violence: Not At Risk (01/06/2018)   Humiliation, Afraid, Rape, and Kick questionnaire    Fear of Current or Ex-Partner: No    Emotionally Abused: No    Physically Abused: No    Sexually Abused: No    Review of Systems: 12 system ROS is negative except as noted above.   Physical Exam: General:   Alert,  well-nourished, pleasant and cooperative in NAD Head:  Normocephalic and atraumatic. Eyes:  Sclera clear, no icterus.   Conjunctiva pink. Ears:  Normal auditory acuity. Nose:  No deformity, discharge,  or lesions. Mouth:  No  deformity or lesions.   Neck:  Supple; no masses or thyromegaly. Lungs:  Clear throughout to auscultation.   No wheezes. Heart:  Regular rate and rhythm; no murmurs. Abdomen:  Soft, central obesity, nontender, nondistended, normal bowel sounds, no rebound or guarding. No hepatosplenomegaly.   Rectal:  Deferred  Msk:  Symmetrical. No boney deformities LAD: No inguinal or umbilical LAD Extremities:  No clubbing or edema. Neurologic:  Alert and  oriented x4;  grossly nonfocal Skin:  Intact without significant lesions or rashes. Psych:  Alert and cooperative. Normal mood and affect.   Vega Withrow L. Tarri Glenn, MD, MPH 05/08/2022, 10:09 AM

## 2022-05-08 ENCOUNTER — Ambulatory Visit: Payer: 59 | Admitting: Gastroenterology

## 2022-05-08 ENCOUNTER — Encounter: Payer: Self-pay | Admitting: Gastroenterology

## 2022-05-08 ENCOUNTER — Other Ambulatory Visit (INDEPENDENT_AMBULATORY_CARE_PROVIDER_SITE_OTHER): Payer: 59

## 2022-05-08 VITALS — BP 116/70 | HR 100 | Ht 77.0 in | Wt 331.6 lb

## 2022-05-08 DIAGNOSIS — R197 Diarrhea, unspecified: Secondary | ICD-10-CM

## 2022-05-08 DIAGNOSIS — R945 Abnormal results of liver function studies: Secondary | ICD-10-CM | POA: Diagnosis not present

## 2022-05-08 DIAGNOSIS — Z1211 Encounter for screening for malignant neoplasm of colon: Secondary | ICD-10-CM

## 2022-05-08 LAB — HEPATIC FUNCTION PANEL
ALT: 14 U/L (ref 0–53)
AST: 13 U/L (ref 0–37)
Albumin: 3.7 g/dL (ref 3.5–5.2)
Alkaline Phosphatase: 92 U/L (ref 39–117)
Bilirubin, Direct: 0.1 mg/dL (ref 0.0–0.3)
Total Bilirubin: 0.2 mg/dL (ref 0.2–1.2)
Total Protein: 6.9 g/dL (ref 6.0–8.3)

## 2022-05-08 MED ORDER — NA SULFATE-K SULFATE-MG SULF 17.5-3.13-1.6 GM/177ML PO SOLN: 1.0000 | Freq: Once | ORAL | 0 refills | Status: AC

## 2022-05-08 NOTE — Patient Instructions (Signed)
Your provider has requested that you go to the basement level for lab work before leaving today. Press "B" on the elevator. The lab is located at the first door on the left as you exit the elevator.  You have been scheduled for a colonoscopy. Please follow written instructions given to you at your visit today.  Please pick up your prep supplies at the pharmacy within the next 1-3 days. If you use inhalers (even only as needed), please bring them with you on the day of your procedure.  _______________________________________________________  If your blood pressure at your visit was 140/90 or greater, please contact your primary care physician to follow up on this.  _______________________________________________________  If you are age 8 or older, your body mass index should be between 23-30. Your Body mass index is 39.32 kg/m. If this is out of the aforementioned range listed, please consider follow up with your Primary Care Provider.  If you are age 61 or younger, your body mass index should be between 19-25. Your Body mass index is 39.32 kg/m. If this is out of the aformentioned range listed, please consider follow up with your Primary Care Provider.   ________________________________________________________  The [facility] providers would like to encourage you to use [facility] to communicate with providers for non-urgent requests or questions.  Due to long hold times on the telephone, sending your provider a message by [facility] may be a faster and more efficient way to get a response.  Please allow 48 business hours for a response.  Please remember that this is for non-urgent requests.  _______________________________________________________

## 2022-05-09 ENCOUNTER — Encounter (HOSPITAL_BASED_OUTPATIENT_CLINIC_OR_DEPARTMENT_OTHER): Payer: Self-pay | Admitting: Family Medicine

## 2022-05-09 ENCOUNTER — Encounter (HOSPITAL_BASED_OUTPATIENT_CLINIC_OR_DEPARTMENT_OTHER): Payer: Self-pay

## 2022-05-09 DIAGNOSIS — E669 Obesity, unspecified: Secondary | ICD-10-CM

## 2022-05-15 MED ORDER — WEGOVY 0.25 MG/0.5ML ~~LOC~~ SOAJ: 0.2500 mg | SUBCUTANEOUS | 1 refills | Status: DC

## 2022-05-23 NOTE — Progress Notes (Unsigned)
[facility] [address] [place]. [place] [address] [phone]       OFFICE FOLLOW UP NOTE  Mr. Kelly Johnston Date of Birth:  1970/10/09 Medical Record Number:  [id number]   Reason for visit: Initial CPAP follow-up    SUBJECTIVE:   CHIEF COMPLAINT:  No chief complaint on file.  Follow-up visit:  Prior visit: 05/23/2021  Brief HPI:   Kelly Johnston is a 52 y.o. male who is being followed for OSA on CPAP.  Initially seen by Dr. Rexene Alberts 11/30/2020 with longstanding history of sleep apnea, received a new CPAP machine in 02/2021.  At prior visit, compliance report showed excellent compliance and optimal residual AHI.  Tolerating CPAP well.  Interval history:   [place] Sleepiness Scale 3/24.  Fatigue severity scale 25/63.           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 Georgette Dover, MD;  Location: [facility];  Service: General;  Laterality: Left;   PECTUS EXCAVATUM REPAIR      Social History:  Social History   Socioeconomic History   Marital status: Divorced    Spouse name: Not on file   Number of children: 3   Years of education: Not on file   Highest education level: Professional school degree (e.g., MD, DDS, DVM, JD)  Occupational History   Not on file  Tobacco Use   Smoking status: Never   Smokeless tobacco: Never  Vaping Use   Vaping Use: Never used  Substance and Sexual Activity   Alcohol use: Yes    Comment: OCC   Drug use: Not Currently    Types: Marijuana     Comment: last used 3 to 4 years ago   Sexual activity: Yes    Birth control/protection: Other-see comments  Other Topics Concern   Not on file  Social History Narrative   Not on file   Social Determinants of Health   Financial Resource Strain: Low Risk  (01/06/2018)   Overall Financial Resource Strain (CARDIA)    Difficulty of Paying Living Expenses: Not hard at all  Food Insecurity: No Food Insecurity (01/06/2018)   Hunger Vital Sign    Worried About Running Out of Food in the Last Year: Never true    [place] in the Last Year: Never true  Transportation Needs: No Transportation Needs (01/06/2018)   PRAPARE - Hydrologist (Medical): No    Lack of Transportation (Non-Medical): No  Physical Activity: Inactive (01/06/2018)   Exercise Vital Sign    Days of Exercise per Week: 0 days    Minutes of Exercise per Session: 0 min  Stress: Stress Concern Present (01/06/2018)   [place]    Feeling of Stress : Very much  Social Connections: Unknown (01/06/2018)   Social Connection and Isolation Panel [NHANES]  Frequency of Communication with Friends and Family: Not on file    Frequency of Social Gatherings with Friends and Family: Not on file    Attends Religious Services: Never    Active Member of Clubs or Organizations: No    Attends Archivist Meetings: Never    Marital Status: Divorced  Human resources officer Violence: Not At Risk (01/06/2018)   Humiliation, Afraid, Rape, and Kick questionnaire    Fear of Current or Ex-Partner: No    Emotionally Abused: No    Physically Abused: No    Sexually Abused: No    Family History:  Family History  Problem Relation Age of Onset   Testicular cancer Father    Alcohol abuse Cousin    Sleep apnea Neg Hx     Medications:   Current Outpatient Medications on File Prior to Visit  Medication Sig Dispense Refill   liothyronine  (CYTOMEL) 25 MCG tablet TAKE 1 AND 1/2 TABLET BY MOUTH DAILY 135 tablet 3   lisinopril (ZESTRIL) 40 MG tablet Take 1 tablet (40 mg total) by mouth daily. 90 tablet 2   mirtazapine (REMERON) 45 MG tablet Take 1 tablet (45 mg total) by mouth at bedtime. 90 tablet 2   OLANZapine (ZYPREXA) 5 MG tablet Take 1 tablet (5 mg total) by mouth at bedtime. 90 tablet 2   Semaglutide-Weight Management (WEGOVY) 0.25 MG/0.5ML SOAJ Inject 0.25 mg into the skin once a week. Use this dose for 1 month (4 shots) and then increase to next higher dose. 2 mL 1   sildenafil (VIAGRA) 100 MG tablet Take 1 tablet (100 mg total) by mouth daily as needed. 90 tablet 1   venlafaxine XR (EFFEXOR-XR) 75 MG 24 hr capsule Take 3 capsules (225 mg total) by mouth daily with breakfast. 270 capsule 2   No current facility-administered medications on file prior to visit.    Allergies:   Allergies  Allergen Reactions   Latex Rash      OBJECTIVE:  Physical Exam  There were no vitals filed for this visit.  There is no height or weight on file to calculate BMI. No results found.   General: well developed, well nourished, very pleasant middle-age Caucasian male, seated, in no evident distress Head: head normocephalic and atraumatic.   Neck: supple with no carotid or supraclavicular bruits Cardiovascular: regular rate and rhythm, no murmurs Musculoskeletal: no deformity Skin:  no rash/petichiae Vascular:  Normal pulses all extremities   Neurologic Exam Mental Status: Awake and fully alert. Oriented to place and time. Recent and remote memory intact. Attention span, concentration and fund of knowledge appropriate. Mood and affect appropriate.  Cranial Nerves: Pupils equal, briskly reactive to light. Extraocular movements full without nystagmus. Visual fields full to confrontation. Hearing intact. Facial sensation intact. Face, tongue, palate moves normally and symmetrically.  Motor: Normal bulk and tone. Normal strength in  all tested extremity muscles Sensory.: intact to touch , pinprick , position and vibratory sensation.  Coordination: Rapid alternating movements normal in all extremities. Finger-to-nose and heel-to-shin performed accurately bilaterally. Gait and Station: Arises from chair without difficulty. Stance is normal. Gait demonstrates normal stride length and balance without use of AD. Tandem walk and heel toe without difficulty.  Reflexes: 1+ and symmetric. Toes downgoing.         ASSESSMENT: Kelly Johnston is a 52 y.o. year old male with longstanding history of sleep apnea on CPAP.  Recently received new CPAP machine and returns today for initial compliance visit.  PLAN:  OSA on CPAP : Compliance report shows excellent compliance at 100% and optimal residual AHI 0.2.  Encourage continued nightly CPAP use for full benefit and insurance purposes.  Continue set pressure of 11 with EPR level 3.  Continue to follow with DME company for any needed supplies or CPAP related concerns.    Follow up in 1 year or call earlier if needed   CC:  PCP: [place], Blondell Reveal, MD    I spent 24 minutes of face-to-face and non-face-to-face time with patient.  This included previsit chart review, lab review, study review, order entry, electronic health record documentation, patient education regarding sleep apnea with review and discussion of compliance report and answered all other questions to patient's satisfaction   Frann Rider, [facility]  [facility] [address] [place], [place] [address]  Phone [phone] Fax [phone] Note: This document was prepared with digital dictation and possible smart phrase technology. Any transcriptional errors that result from this process are unintentional.

## 2022-05-24 ENCOUNTER — Encounter: Payer: Self-pay | Admitting: Adult Health

## 2022-05-24 ENCOUNTER — Ambulatory Visit: Payer: 59 | Admitting: Adult Health

## 2022-05-24 VITALS — BP 107/76 | HR 106 | Ht 77.0 in | Wt 326.0 lb

## 2022-05-24 DIAGNOSIS — G4733 Obstructive sleep apnea (adult) (pediatric): Secondary | ICD-10-CM | POA: Diagnosis not present

## 2022-05-24 MED ORDER — OZEMPIC (0.25 OR 0.5 MG/DOSE) 2 MG/3ML ~~LOC~~ SOPN: 0.2500 mg | PEN_INJECTOR | SUBCUTANEOUS | 1 refills | Status: AC

## 2022-05-24 NOTE — Patient Instructions (Signed)
Your Plan:  Continue nightly use of CPAP for adequate sleep apnea management  Continue to follow with DME [employer] for any needed supplies or CPAP related concerns    Follow-up in 1 year or call earlier if needed     Thank you for coming to see [place] at [facility]. I hope we have been able to provide you high quality care today.  You may receive a patient satisfaction survey over the next few weeks. We would appreciate your feedback and comments so that we may continue to improve ourselves and the health of our patients.

## 2022-06-12 ENCOUNTER — Ambulatory Visit (AMBULATORY_SURGERY_CENTER): Payer: 59 | Admitting: Gastroenterology

## 2022-06-12 ENCOUNTER — Encounter: Payer: Self-pay | Admitting: Gastroenterology

## 2022-06-12 VITALS — BP 111/69 | HR 91 | Temp 98.4°F | Resp 16 | Ht 77.0 in | Wt 326.0 lb

## 2022-06-12 DIAGNOSIS — D124 Benign neoplasm of descending colon: Secondary | ICD-10-CM

## 2022-06-12 DIAGNOSIS — I10 Essential (primary) hypertension: Secondary | ICD-10-CM | POA: Diagnosis not present

## 2022-06-12 DIAGNOSIS — K635 Polyp of colon: Secondary | ICD-10-CM | POA: Diagnosis not present

## 2022-06-12 DIAGNOSIS — D123 Benign neoplasm of transverse colon: Secondary | ICD-10-CM

## 2022-06-12 DIAGNOSIS — R945 Abnormal results of liver function studies: Secondary | ICD-10-CM | POA: Diagnosis not present

## 2022-06-12 DIAGNOSIS — Z1211 Encounter for screening for malignant neoplasm of colon: Secondary | ICD-10-CM | POA: Diagnosis not present

## 2022-06-12 DIAGNOSIS — G4733 Obstructive sleep apnea (adult) (pediatric): Secondary | ICD-10-CM | POA: Diagnosis not present

## 2022-06-12 MED ORDER — SODIUM CHLORIDE 0.9 % IV SOLN
500.0000 mL | INTRAVENOUS | Status: DC
Start: 2022-06-12 — End: 2022-06-12

## 2022-06-12 NOTE — Progress Notes (Signed)
Report to PACU, RN, vss, BBS= Clear.  

## 2022-06-12 NOTE — Progress Notes (Signed)
Referring Provider: de [place], Buren Kos, MD Primary Care Physician:  [place], Buren Kos, MD  Indication for Colonoscopy:  Colon cancer screening   IMPRESSION:  Need for colon cancer screening Appropriate candidate for monitored anesthesia care  PLAN: Colonoscopy in the [facility] today   HPI: Kelly Johnston is a 52 y.o. male presents for screening colonoscopy.  No prior colonoscopy or colon cancer screening.  No known family history of colon cancer or polyps. No family history of uterine/endometrial cancer, pancreatic cancer or gastric/stomach cancer.   Past Medical History:  Diagnosis Date   Anxiety    Benzodiazepine withdrawal    Chronic kidney disease    protein builds up   Depression    Heart murmur    Hypertension    IgA nephropathy    Sleep apnea    cpap    Past Surgical History:  Procedure Laterality Date   chest surgery     INCISION AND DRAINAGE ABSCESS  01/16/2012   Procedure: INCISION AND DRAINAGE ABSCESS;  Surgeon: Wilmon Arms. Tsuei, MD;  Location: [facility];  Service: General;  Laterality: Left;   PECTUS EXCAVATUM REPAIR      Current Outpatient Medications  Medication Sig Dispense Refill   gatifloxacin (ZYMAXID) 0.5 % SOLN 1 drop 3 (three) times daily.     liothyronine (CYTOMEL) 25 MCG tablet TAKE 1 AND 1/2 TABLET BY MOUTH DAILY 135 tablet 3   lisinopril (ZESTRIL) 40 MG tablet Take 1 tablet (40 mg total) by mouth daily. 90 tablet 2   LOTEMAX 0.5 % ophthalmic suspension 1 drop 3 (three) times daily.     mirtazapine (REMERON) 45 MG tablet Take 1 tablet (45 mg total) by mouth at bedtime. 90 tablet 2   OLANZapine (ZYPREXA) 5 MG tablet Take 1 tablet (5 mg total) by mouth at bedtime. 90 tablet 2   venlafaxine XR (EFFEXOR-XR) 75 MG 24 hr capsule Take 3 capsules (225 mg total) by mouth daily with breakfast. 270 capsule 2   Semaglutide,0.25 or 0.5MG /DOS, (OZEMPIC, 0.25 OR 0.5 MG/DOSE,) 2 MG/3ML SOPN Inject 0.25 mg into the skin once a week. (Patient not taking: Reported  on 06/12/2022) 3 mL 1   sildenafil (VIAGRA) 100 MG tablet Take 1 tablet (100 mg total) by mouth daily as needed. 90 tablet 1   Current Facility-Administered Medications  Medication Dose Route Frequency Provider Last Rate Last Admin   0.9 %  sodium chloride infusion  500 mL Intravenous Continuous Tressia Danas, MD        Allergies as of 06/12/2022 - Review Complete 06/12/2022  Allergen Reaction Noted   Latex Rash 05/28/2016    Family History  Problem Relation Age of Onset   Testicular cancer Father    Alcohol abuse Cousin    Sleep apnea Neg Hx    Colon cancer Neg Hx    Esophageal cancer Neg Hx    Rectal cancer Neg Hx    Stomach cancer Neg Hx      Physical Exam: General:   Alert,  well-nourished, pleasant and cooperative in NAD Head:  Normocephalic and atraumatic. Eyes:  Sclera clear, no icterus.   Conjunctiva pink. Mouth:  No deformity or lesions.   Neck:  Supple; no masses or thyromegaly. Lungs:  Clear throughout to auscultation.   No wheezes. Heart:  Regular rate and rhythm; no murmurs. Abdomen:  Soft, non-tender, nondistended, normal bowel sounds, no rebound or guarding.  Msk:  Symmetrical. No boney deformities LAD: No inguinal or umbilical LAD Extremities:  No  clubbing or edema. Neurologic:  Alert and  oriented x4;  grossly nonfocal Skin:  No obvious rash or bruise. Psych:  Alert and cooperative. Normal mood and affect.     Studies/Results: No results found.    Heli Dino L. Orvan Falconer, MD, MPH 06/12/2022, 1:21 PM

## 2022-06-12 NOTE — Patient Instructions (Signed)
Resume your current medications today.  YOU HAD AN ENDOSCOPIC PROCEDURE TODAY AT THE [facility]:   Refer to the procedure report that was given to you for any specific questions about what was found during the examination.  If the procedure report does not answer your questions, please call your gastroenterologist to clarify.  If you requested that your care partner not be given the details of your procedure findings, then the procedure report has been included in a sealed envelope for you to review at your convenience later.  YOU SHOULD EXPECT: Some feelings of bloating in the abdomen. Passage of more gas than usual.  Walking can help get rid of the air that was put into your GI tract during the procedure and reduce the bloating. If you had a lower endoscopy (such as a colonoscopy or flexible sigmoidoscopy) you may notice spotting of blood in your stool or on the toilet paper. If you underwent a bowel prep for your procedure, you may not have a normal bowel movement for a few days.  Please Note:  You might notice some irritation and congestion in your nose or some drainage.  This is from the oxygen used during your procedure.  There is no need for concern and it should clear up in a day or so.  SYMPTOMS TO REPORT IMMEDIATELY:  Following lower endoscopy (colonoscopy or flexible sigmoidoscopy):  Excessive amounts of blood in the stool  Significant tenderness or worsening of abdominal pains  Swelling of the abdomen that is new, acute  Fever of 100F or higher  For urgent or emergent issues, a gastroenterologist can be reached at any hour by calling [phone]. Do not use MyChart messaging for urgent concerns.    DIET:  We do recommend a small meal at first, but then you may proceed to your regular diet.  Drink plenty of fluids but you should avoid alcoholic beverages for 24 hours.  Try to eat a high fiber diet, and drink at least 4 oz of water per day.  ACTIVITY:  You should  plan to take it easy for the rest of today and you should NOT DRIVE or use heavy machinery until tomorrow (because of the sedation medicines used during the test).    FOLLOW UP: Our staff will call the number listed on your records the next business day following your procedure.  We will call around 7:15- 8:00 am to check on you and address any questions or concerns that you may have regarding the information given to you following your procedure. If we do not reach you, we will leave a message.     If any biopsies were taken you will be contacted by phone or by letter within the next 1-3 weeks.  Please call us at [phone] if you have not heard about the biopsies in 3 weeks.    SIGNATURES/CONFIDENTIALITY: You and/or your care partner have signed paperwork which will be entered into your electronic medical record.  These signatures attest to the fact that that the information above on your After Visit Summary has been reviewed and is understood.  Full responsibility of the confidentiality of this discharge information lies with you and/or your care-partner.

## 2022-06-12 NOTE — Progress Notes (Signed)
Called to room to assist during endoscopic procedure.  Patient ID and intended procedure confirmed with present staff. Received instructions for my participation in the procedure from the performing physician.  

## 2022-06-12 NOTE — Op Note (Signed)
[facility] Patient Name: Kelly Johnston Procedure Date: 06/12/2022 1:21 PM MRN: [id number] Endoscopist: Tressia Danas MD, MD, [id number] Age: 52 Referring MD:  Date of Birth: [birth year] Gender: Male Account #: [id number] Procedure:                Colonoscopy Indications:              Screening for colorectal malignant neoplasm, This                            is the patient's first colonoscopy Medicines:                Monitored Anesthesia Care Procedure:                Pre-Anesthesia Assessment:                           - Prior to the procedure, a History and Physical                            was performed, and patient medications and                            allergies were reviewed. The patient's tolerance of                            previous anesthesia was also reviewed. The risks                            and benefits of the procedure and the sedation                            options and risks were discussed with the patient.                            All questions were answered, and informed consent                            was obtained. Prior Anticoagulants: The patient has                            taken no anticoagulant or antiplatelet agents. ASA                            Grade Assessment: II - A patient with mild systemic                            disease. After reviewing the risks and benefits,                            the patient was deemed in satisfactory condition to                            undergo the procedure.  After obtaining informed consent, the colonoscope                            was passed under direct vision. Throughout the                            procedure, the patient's blood pressure, pulse, and                            oxygen saturations were monitored continuously. The                            CF [id number] #[id number] was introduced through the anus                            and advanced to the  3 cm into the ileum. A second                            forward view of the right colon was performed. The                            colonoscopy was performed without difficulty. The                            patient tolerated the procedure well. The quality                            of the bowel preparation was good. The terminal                            ileum, ileocecal valve, appendiceal orifice, and                            rectum were photographed. Scope In: 1:27:20 PM Scope Out: 1:43:53 PM Scope Withdrawal Time: 0 hours 14 minutes 24 seconds  Total Procedure Duration: 0 hours 16 minutes 33 seconds  Findings:                 The perianal and digital rectal examinations were                            normal.                           A few medium-mouthed and small-mouthed diverticula                            were found in the sigmoid colon.                           A 5 mm polyp was found in the descending colon. The                            polyp was sessile. The polyp was removed with a  cold snare. Resection and retrieval were complete.                            Estimated blood loss was minimal.                           A 3 mm polyp was found in the splenic flexure. The                            polyp was sessile. The polyp was removed with a                            cold snare. Resection and retrieval were complete.                            Estimated blood loss was minimal.                           The exam was otherwise without abnormality on                            direct and retroflexion views. Complications:            No immediate complications. Estimated Blood Loss:     Estimated blood loss was minimal. Impression:               - Diverticulosis in the sigmoid colon.                           - One 5 mm polyp in the descending colon, removed                            with a cold snare. Resected and retrieved.                            - One 3 mm polyp at the splenic flexure, removed                            with a cold snare. Resected and retrieved.                           - The examination was otherwise normal on direct                            and retroflexion views. Recommendation:           - Patient has a contact number available for                            emergencies. The signs and symptoms of potential                            delayed complications were discussed with the  patient. Return to normal activities tomorrow.                            Written discharge instructions were provided to the                            patient.                           - Resume previous diet.                           - Continue present medications.                           - Await pathology results.                           - Repeat colonoscopy date to be determined after                            pending pathology results are reviewed for                            surveillance.                           - Emerging evidence supports eating a diet of                            fruits, vegetables, grains, calcium, and yogurt                            while reducing red meat and alcohol may reduce the                            risk of colon cancer.                           - Follow a high fiber diet. Drink at least 64                            ounces of water daily. Add a daily stool bulking                            agent such as psyllium (an exampled would be                            Metamucil).                           - Thank you for allowing me to be involved in your                            colon cancer prevention. Tressia Danas MD, MD 06/12/2022 1:49:01 PM This report has been  signed electronically.

## 2022-06-12 NOTE — Progress Notes (Signed)
Pt. states no medical or surgical changes since previsit or office visit. 

## 2022-06-13 ENCOUNTER — Telehealth: Payer: Self-pay | Admitting: *Deleted

## 2022-06-13 NOTE — Telephone Encounter (Signed)
  Follow up Call-    Row Labels 06/12/2022   12:43 PM  Call back number   Section Header. No data exists in this row.   Post procedure Call Back phone  #   [phone]  Permission to leave phone message   Yes     Patient questions:  Do you have a fever, pain , or abdominal swelling? No. Pain Score  0 *  Have you tolerated food without any problems? Yes.    Have you been able to return to your normal activities? Yes.    Do you have any questions about your discharge instructions: Diet   No. Medications  No. Follow up visit  No.  Do you have questions or concerns about your Care? No.  Actions: * If pain score is 4 or above: No action needed, pain <4.

## 2022-06-19 ENCOUNTER — Encounter: Payer: Self-pay | Admitting: Gastroenterology

## 2022-06-26 DIAGNOSIS — E669 Obesity, unspecified: Secondary | ICD-10-CM | POA: Diagnosis not present

## 2022-06-26 DIAGNOSIS — F329 Major depressive disorder, single episode, unspecified: Secondary | ICD-10-CM | POA: Diagnosis not present

## 2022-06-26 DIAGNOSIS — R809 Proteinuria, unspecified: Secondary | ICD-10-CM | POA: Diagnosis not present

## 2022-06-26 DIAGNOSIS — I10 Essential (primary) hypertension: Secondary | ICD-10-CM | POA: Diagnosis not present

## 2022-06-26 DIAGNOSIS — N02B9 Other recurrent and persistent immunoglobulin A nephropathy: Secondary | ICD-10-CM | POA: Diagnosis not present

## 2022-06-27 LAB — LAB REPORT - SCANNED
Albumin, Urine POC: 7.4
Creatinine, POC: 106.2 mg/dL
Microalb Creat Ratio: 7

## 2022-07-11 DIAGNOSIS — N02B9 Other recurrent and persistent immunoglobulin A nephropathy: Secondary | ICD-10-CM | POA: Diagnosis not present

## 2022-08-06 DIAGNOSIS — N02B9 Other recurrent and persistent immunoglobulin A nephropathy: Secondary | ICD-10-CM | POA: Diagnosis not present

## 2022-08-06 DIAGNOSIS — E039 Hypothyroidism, unspecified: Secondary | ICD-10-CM | POA: Diagnosis not present

## 2022-08-06 DIAGNOSIS — E669 Obesity, unspecified: Secondary | ICD-10-CM | POA: Diagnosis not present

## 2022-08-06 DIAGNOSIS — N179 Acute kidney failure, unspecified: Secondary | ICD-10-CM | POA: Diagnosis not present

## 2022-08-06 DIAGNOSIS — I10 Essential (primary) hypertension: Secondary | ICD-10-CM | POA: Diagnosis not present

## 2022-08-06 DIAGNOSIS — R809 Proteinuria, unspecified: Secondary | ICD-10-CM | POA: Diagnosis not present

## 2022-08-07 LAB — LAB REPORT - SCANNED
Creatinine, POC: 121.2 mg/dL
EGFR: 64

## 2022-08-09 ENCOUNTER — Telehealth: Payer: Self-pay | Admitting: Psychiatry

## 2022-08-09 ENCOUNTER — Other Ambulatory Visit: Payer: Self-pay | Admitting: Psychiatry

## 2022-08-09 DIAGNOSIS — N522 Drug-induced erectile dysfunction: Secondary | ICD-10-CM

## 2022-08-09 MED ORDER — SILDENAFIL CITRATE 100 MG PO TABS
100.0000 mg | ORAL_TABLET | Freq: Every day | ORAL | 0 refills | Status: AC | PRN
Start: 2022-08-09 — End: ?

## 2022-08-09 NOTE — Telephone Encounter (Signed)
Patient called asking for a refill on his viagra 100 mg. [facility] [address]

## 2022-10-30 ENCOUNTER — Ambulatory Visit (INDEPENDENT_AMBULATORY_CARE_PROVIDER_SITE_OTHER): Payer: 59 | Admitting: Psychiatry

## 2022-10-30 ENCOUNTER — Encounter: Payer: Self-pay | Admitting: Psychiatry

## 2022-10-30 DIAGNOSIS — F411 Generalized anxiety disorder: Secondary | ICD-10-CM | POA: Diagnosis not present

## 2022-10-30 DIAGNOSIS — R635 Abnormal weight gain: Secondary | ICD-10-CM | POA: Diagnosis not present

## 2022-10-30 DIAGNOSIS — T50905A Adverse effect of unspecified drugs, medicaments and biological substances, initial encounter: Secondary | ICD-10-CM

## 2022-10-30 DIAGNOSIS — F3342 Major depressive disorder, recurrent, in full remission: Secondary | ICD-10-CM

## 2022-10-30 DIAGNOSIS — F4010 Social phobia, unspecified: Secondary | ICD-10-CM | POA: Diagnosis not present

## 2022-10-30 MED ORDER — OLANZAPINE 7.5 MG PO TABS
7.5000 mg | ORAL_TABLET | Freq: Every day | ORAL | 0 refills | Status: DC
Start: 2022-10-30 — End: 2023-01-31

## 2022-10-30 NOTE — Progress Notes (Signed)
Kelly Johnston [id number] 20-Apr-1970 52 y.o.  Subjective:   Patient ID:  Kelly Johnston is a 52 y.o. (DOB 09-14-1970) male.  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  Was completely in remission on May 4 when seen.  The only change since then has been discontinuing olanzapine. We completed weaned off olanzapine and discontinued it.  At follow-up visit August 06, 2018 he had relapsed with depression and rumination and anxiety.  Venlafaxine was increased to 225 mg daily.  Olanzapine was restarted at 5 mg nightly.  visit in November 2020 his depression and anxiety symptoms had largely resolved.  There were no med changes.  05/20/19 appt with the following noted: Still OK with depression.  Nothing has gotten worse.  Able to be productive and enjoy things.  Anxiety is managed and Ativan does a little without se.  Pleased with meds without SE. "More normal".  Hopeless resolved.  Quick improvement so probably the olanzapine helped.  Residual depression and anxiety are the same and not insurmountable.  Tears resolved.  Sleep without   Initial and terminal insomnia.  Some worry over job and finances.  Conc is better.  Caffeine not after 5.  1 diet coke in afternoon.  No RLS.   Almost never using Ativan. 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 Plan: no med changes  11/19/2019 appointment with the following noted: Good. No issues with depression.  Anxiety OK. No Ativan used Patient reports stable mood and denies depressed or irritable moods.  Patient denies any recent difficulty with anxiety.  Patient denies difficulty with sleep initiation or maintenance. Denies appetite disturbance.  Patient reports that energy and motivation have been good.  Patient denies any difficulty with  concentration.  Patient denies any suicidal ideation. No SE concerns. Plan no med changes  05/23/2020 appointment with the following noted:  doing good without relapses and satisfied with meds. Happier with career choice.  Makes a difference with mental health.  Doing real estate appraisals.  Staying busy.  Anxiety goodl Patient reports stable mood and denies depressed or irritable moods.  Patient denies any recent difficulty with anxiety.  Patient denies difficulty with sleep initiation or maintenance. Denies appetite disturbance.  Patient reports that energy and motivation have been good.  Patient denies any difficulty with concentration.  Patient denies any suicidal ideation. Wonders if binge eating disorder.  Uncontrollable urge even if not hungry.  Goes to extreme.  Wonders if started when depressed.  01/25/21 appt noted: Still good without relapses of depression since here. Work is Firefighter. Patient reports stable mood and denies depressed or irritable moods.  Patient denies any recent difficulty with anxiety.  Patient denies difficulty with sleep initiation or maintenance. Denies appetite disturbance.  Patient reports that energy and motivation have been good.  Patient denies any difficulty with concentration.  Patient denies any suicidal ideation. Satisfied with meds.  Disc Lybalvi approval. Tolerating the meds except for the weight gain.  He has not been taking Lybalvi yet.  We discussed the approval by the [facility] Department of insurance Plan: Trial Libalvi in place of olanzapine After appeal to the [facility] of insurance the insurance company's denial of coverage was overturned.  The patient now has access to [facility]  and will start Lybalvi 5 mg in place of the olanzapine in order to help with weight loss.  07/25/2021 appointment with the following noted: Never got to take Lybalvi bc $4300/90 days. So been on the olanzapine 5 HS. Doing well.  Work is good, life and  family good. Better career fit for him. Patient reports stable mood and denies depressed or irritable moods.  Patient denies any recent difficulty with anxiety.  Patient denies difficulty with sleep initiation or maintenance. Denies appetite disturbance.  Patient reports that energy and motivation have been good.  Patient denies any difficulty with concentration.  Patient denies any suicidal ideation. Not much difference in appetite with Lybalvi when had samples. No concerns with meds. Plan: Trial Libalvi in place of olanzapine not helpful enough with appetite and weight using samples so will stick with olanzapine 5 mg nightly. Continue mirtazapine 45 mg nightly Continue venlafaxine XR's 225 mg daily TSH is suppressed at 0.152\in March 2023 given that he is been doing well for some time we can probably get by reducing the Cytomel.  We will reduce Cytomel from 50 mcg every morning to 37.5 mg nightly  01/25/22 appt noted:  Son at [employer] playing bball. Doing great.  Everything going well.  No depressive episodes since here.  Anxiety wihtout a problem.  8-10 hours sleep. No concerns with meds. No problems with reducing Cytomel to 25 mcg AM. Health is fine. No changes desired. Work is pretty good and enjoys it and still fairly busy.  Appraising.  Plan: continue Cytomel 25 mcg AM potentiator, Venlafaxine XR 225, mirtazapine 45, olanzapine 5 mg HS.  10/30/22 appt noted: Some general anxiety without reason with feeling of fear in the chest in the last 2-3 weeks.  Still plenty of sleep.  Would like to do something about anxity. Meds as above.  No new SE No sig dep.    No other concerns.  He had a psych hosp at [facility] late 2019.  Admitted with hallucinations, delusions and memory problems and had accidentally stopped psych meds.  He had a mixture of symptoms that was diagnosed is perhaps delirium related to benzodiazepine withdrawal as well as depression with psychotic features having just had an  ECT treatment.  He was hospitalized at [facility] from December 3 until December 5.    He completed ECT treatment .  Prior psychiatric meds  lithium,  venlafaxine XR 225 , duloxetine 120, fluoxetine 60, Wellbutrin 450, Viibryd, c Abilify, Cytomel, Rexulti 2 mg, mirtazapine 45 mg, olanzapine 10 helpful for anxiety. 2 courses of ECT with the last completed December 2019  Review of Systems:   No tremor.  No weakness. Started losing weight at gym. Working on [employer]. No palpitations, no chest pain No tremor.  No confusion.  No headaches.  No balance problems.  No sedation.  Medications: I have reviewed the patient's current medications.  Current Outpatient Medications  Medication Sig Dispense Refill   liothyronine (CYTOMEL) 25 MCG tablet TAKE 1 AND 1/2 TABLET BY MOUTH DAILY (Patient taking differently: Take 25 mcg by mouth daily.) 135 tablet 3   lisinopril (ZESTRIL) 40 MG tablet Take 1 tablet (40 mg total) by mouth daily. 90 tablet 2   mirtazapine (REMERON) 45 MG tablet Take 1 tablet (45 mg total) by mouth at bedtime. 90 tablet 2   OLANZapine (ZYPREXA) 7.5 MG tablet Take 1 tablet (7.5 mg total) by mouth at bedtime. 90 tablet 0   Semaglutide,0.25 or 0.5MG /DOS, (OZEMPIC, 0.25 OR 0.5 MG/DOSE,)  2 MG/3ML SOPN Inject 0.25 mg into the skin once a week. 3 mL 1   sildenafil (VIAGRA) 100 MG tablet Take 1 tablet (100 mg total) by mouth daily as needed. 90 tablet 0   venlafaxine XR (EFFEXOR-XR) 75 MG 24 hr capsule Take 3 capsules (225 mg total) by mouth daily with breakfast. 270 capsule 2   No current facility-administered medications for this visit.     Medication Side Effects: None really noted.  Allergies:  Allergies  Allergen Reactions   Latex Rash    Past Medical History:  Diagnosis Date   Anxiety    Benzodiazepine withdrawal (HCC)    Chronic kidney disease    protein builds up   Depression    Heart murmur    Hypertension    IgA nephropathy    Sleep apnea    cpap    Family  History  Problem Relation Age of Onset   Testicular cancer Father    Alcohol abuse Cousin    Sleep apnea Neg Hx    Colon cancer Neg Hx    Esophageal cancer Neg Hx    Rectal cancer Neg Hx    Stomach cancer Neg Hx     Social History   Socioeconomic History   Marital status: Divorced    Spouse name: Not on file   Number of children: 3   Years of education: Not on file   Highest education level: Professional school degree (e.g., MD, DDS, DVM, JD)  Occupational History   Not on file  Tobacco Use   Smoking status: Never   Smokeless tobacco: Never  Vaping Use   Vaping status: Never Used  Substance and Sexual Activity   Alcohol use: Yes    Comment: OCC   Drug use: Not Currently    Types: Marijuana    Comment: last used 3 to 4 years ago   Sexual activity: Yes    Birth control/protection: Other-see comments  Other Topics Concern   Not on file  Social History Narrative   Not on file   Social Determinants of Health   Financial Resource Strain: Low Risk  (01/06/2018)   Overall Financial Resource Strain (CARDIA)    Difficulty of Paying Living Expenses: Not hard at all  Food Insecurity: No Food Insecurity (01/06/2018)   Hunger Vital Sign    Worried About Running Out of Food in the Last Year: Never true    Ran Out of Food in the Last Year: Never true  Transportation Needs: No Transportation Needs (01/06/2018)   PRAPARE - Administrator, Civil Service (Medical): No    Lack of Transportation (Non-Medical): No  Physical Activity: Inactive (01/06/2018)   Exercise Vital Sign    Days of Exercise per Week: 0 days    Minutes of Exercise per Session: 0 min  Stress: Stress Concern Present (01/06/2018)   [name]    Feeling of Stress : Very much  Social Connections: Unknown (01/06/2018)   Social Connection and Isolation Panel [NHANES]    Frequency of Communication with Friends and Family: Not on file     Frequency of Social Gatherings with Friends and Family: Not on file    Attends Religious Services: Never    Active Member of Clubs or Organizations: No    Attends Banker Meetings: Never    Marital Status: Divorced  Catering manager Violence: Not At Risk (01/06/2018)   Humiliation, Afraid, Rape, and Kick questionnaire  Fear of Current or Ex-Partner: No    Emotionally Abused: No    Physically Abused: No    Sexually Abused: No    Past Medical History, Surgical history, Social history, and Family history were reviewed and updated as appropriate.   Please see review of systems for further details on the patient's review from today.   Objective:   Physical Exam:  There were no vitals taken for this visit.  Physical Exam Constitutional:      General: He is not in acute distress.    Appearance: He is well-developed. He is obese.  Musculoskeletal:        General: No deformity.  Neurological:     Mental Status: He is alert and oriented to person, place, and time.     Cranial Nerves: No dysarthria.     Coordination: Coordination normal.  Psychiatric:        Attention and Perception: Attention and perception normal. He does not perceive auditory or visual hallucinations.        Mood and Affect: Mood is anxious. Mood is not depressed. Affect is not labile, blunt, angry or inappropriate.        Speech: Speech normal.        Behavior: Behavior normal. Behavior is cooperative.        Thought Content: Thought content normal. Thought content is not paranoid or delusional. Thought content does not include homicidal or suicidal ideation. Thought content does not include homicidal or suicidal plan.        Cognition and Memory: Cognition and memory normal.        Judgment: Judgment normal.     Comments:  Insight good no evidence for psychosis     Lab Review:     Component Value Date/Time   NA 135 05/24/2020 1648   K 4.2 05/24/2020 1648   CL 100 05/24/2020 1648   CO2 21  (L) 05/24/2020 1648   GLUCOSE 77 05/24/2020 1648   BUN 15 05/24/2020 1648   CREATININE 1.18 05/24/2020 1648   CREATININE 1.82 (H) 11/03/2013 1613   CALCIUM 8.9 05/24/2020 1648   PROT 6.9 05/08/2022 1040   ALBUMIN 3.7 05/08/2022 1040   AST 13 05/08/2022 1040   ALT 14 05/08/2022 1040   ALKPHOS 92 05/08/2022 1040   BILITOT 0.2 05/08/2022 1040   GFRNONAA >60 05/24/2020 1648   GFRNONAA 44 (L) 11/03/2013 1613   GFRAA >60 01/09/2018 1513   GFRAA 51 (L) 11/03/2013 1613       Component Value Date/Time   WBC 8.8 04/27/2021 1511   WBC 8.9 01/09/2018 1513   RBC 4.59 04/27/2021 1511   RBC 5.29 01/09/2018 1513   HGB 14.3 04/27/2021 1511   HCT 41.8 04/27/2021 1511   PLT 320 04/27/2021 1511   MCV 91 04/27/2021 1511   MCH 31.2 04/27/2021 1511   MCH 30.6 01/09/2018 1513   MCHC 34.2 04/27/2021 1511   MCHC 32.3 01/09/2018 1513   RDW 12.9 04/27/2021 1511   LYMPHSABS 2.0 04/27/2021 1511   MONOABS 1.3 (H) 05/30/2016 0409   EOSABS 0.1 04/27/2021 1511   BASOSABS 0.1 04/27/2021 1511    No results found for: "POCLITH", "LITHIUM"   No results found for: "PHENYTOIN", "PHENOBARB", "VALPROATE", "CBMZ"   .res Assessment: Plan:    Major depression, recurrent, full remission (HCC) - Plan: OLANZapine (ZYPREXA) 7.5 MG tablet  Generalized anxiety disorder - Plan: OLANZapine (ZYPREXA) 7.5 MG tablet  Social anxiety disorder - Plan: OLANZapine (ZYPREXA) 7.5 MG tablet  Weight gain  due to medication   Mr. Makar has had a severe episode of depression with psychotic features and anxiety and cognitive impairment which has responded to ECT plus medication changes.  He  completed ECT.  He was markedly better and in remission at his visit on May 4.  At that time we discontinued olanzapine and he had relapsed severely.  So we restarted olanzapine 5 mg daily and increase venlafaxine XR to 225 mg daily.  SX resolved again with olanzapine added. At this point his symptoms of depression and anxiety are  resolved    Tolerating meds except for the weight gain.  depression well managed.  He enjoys his new job. However his anxiety is not as well-controlled as it was.  We discussed options including increasing venlafaxine, increasing olanzapine, or buspirone.  In the interest of not adding additional medications we will adjust the dose of olanzapine because statistically that is that a greater potential to help his anxiety then does increasing the venlafaxine.  It is unlikely he will have additional side effects from olanzapine from doing so but if he does he will let us know. Increase olanzapine 7.5 mg nightly..  Discussed potential metabolic side effects associated with atypical antipsychotics, as well as potential risk for movement side effects. Advised pt to contact office if movement side effects occur.   Consider Ozempic for weight loss.  He could not get this due to the cost.  Continue mirtazapine 45 mg nightly Continue venlafaxine XR's 225 mg daily He asks we refill lisinopril.  BP is good. Reduce Cytomel 25 mg AM  Call if there is any worsening of symptoms  FU 6 mos  Meredith Staggers, MD, DFAPA   Future Appointments  Date Time Provider Department Center  04/30/2023  4:30 PM Cottle, Steva Ready., MD [facility] None  05/27/2023  1:45 PM Ihor Austin, NP [facility] None    No orders of the defined types were placed in this encounter.     -------------------------------

## 2022-12-15 ENCOUNTER — Other Ambulatory Visit: Payer: Self-pay | Admitting: Psychiatry

## 2022-12-15 DIAGNOSIS — F4010 Social phobia, unspecified: Secondary | ICD-10-CM

## 2022-12-15 DIAGNOSIS — F3342 Major depressive disorder, recurrent, in full remission: Secondary | ICD-10-CM

## 2022-12-17 NOTE — Telephone Encounter (Signed)
LF 07/17; LV 09/03

## 2022-12-17 NOTE — Telephone Encounter (Signed)
Changed rf amount to 1.

## 2023-01-31 ENCOUNTER — Other Ambulatory Visit: Payer: Self-pay | Admitting: Psychiatry

## 2023-01-31 DIAGNOSIS — F3342 Major depressive disorder, recurrent, in full remission: Secondary | ICD-10-CM

## 2023-01-31 DIAGNOSIS — F411 Generalized anxiety disorder: Secondary | ICD-10-CM

## 2023-01-31 DIAGNOSIS — F4010 Social phobia, unspecified: Secondary | ICD-10-CM

## 2023-02-15 ENCOUNTER — Other Ambulatory Visit: Payer: Self-pay | Admitting: Psychiatry

## 2023-02-15 DIAGNOSIS — F3342 Major depressive disorder, recurrent, in full remission: Secondary | ICD-10-CM

## 2023-02-28 IMAGING — DX DG KNEE COMPLETE 4+V*R*
4 series · 4 of 4 positions shown · non-contrast
Comparison: None Available.

CLINICAL DATA: Right knee pain.

EXAM:
RIGHT KNEE - COMPLETE 4+ VIEW

[knee ap]
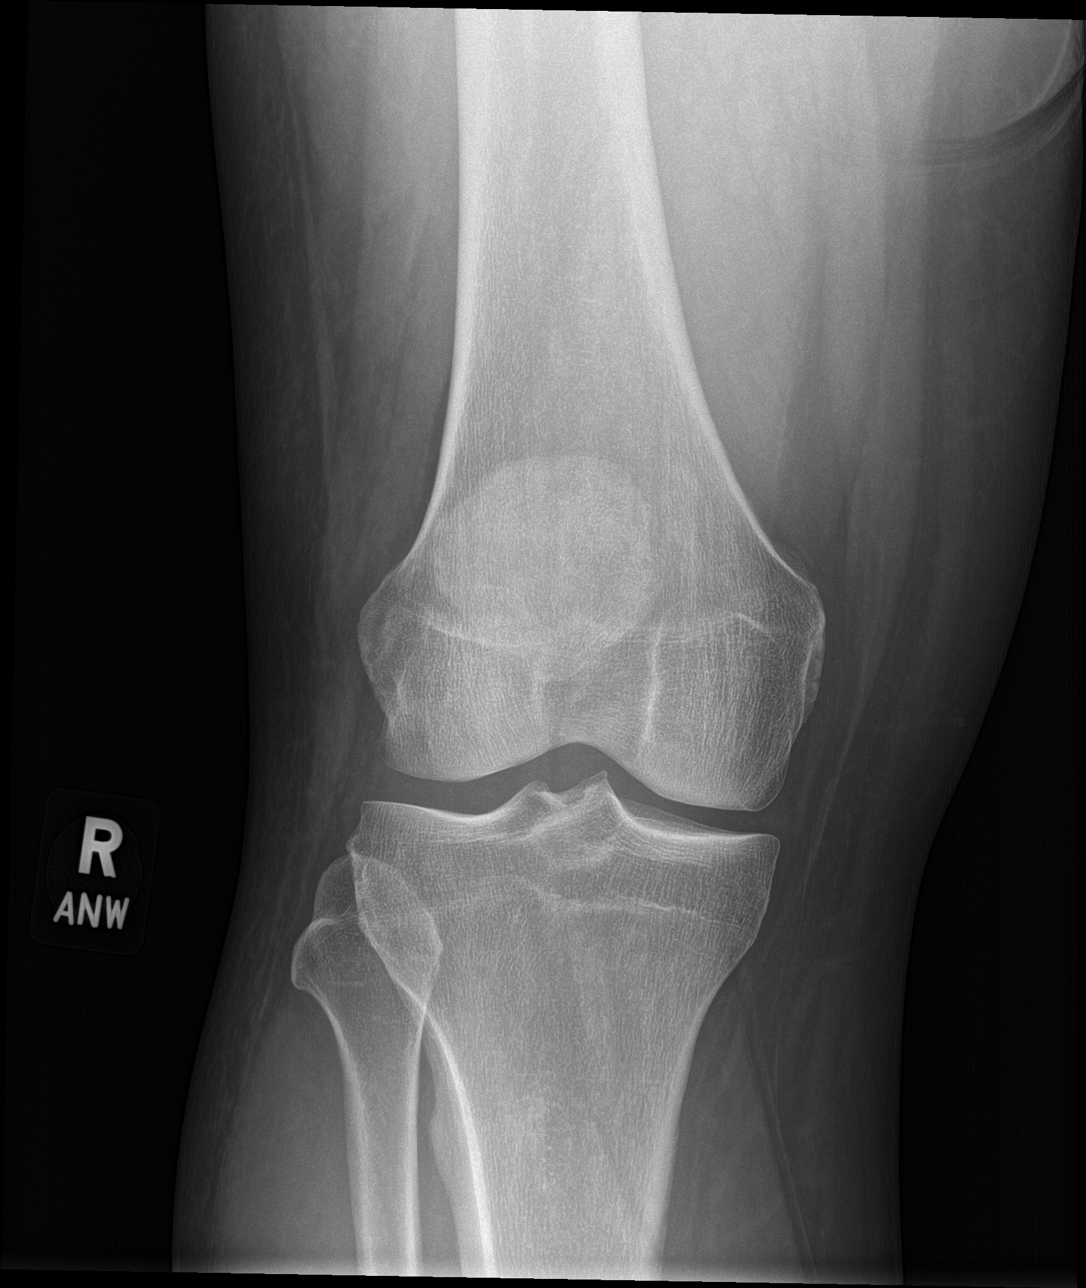

[knee tunnel]
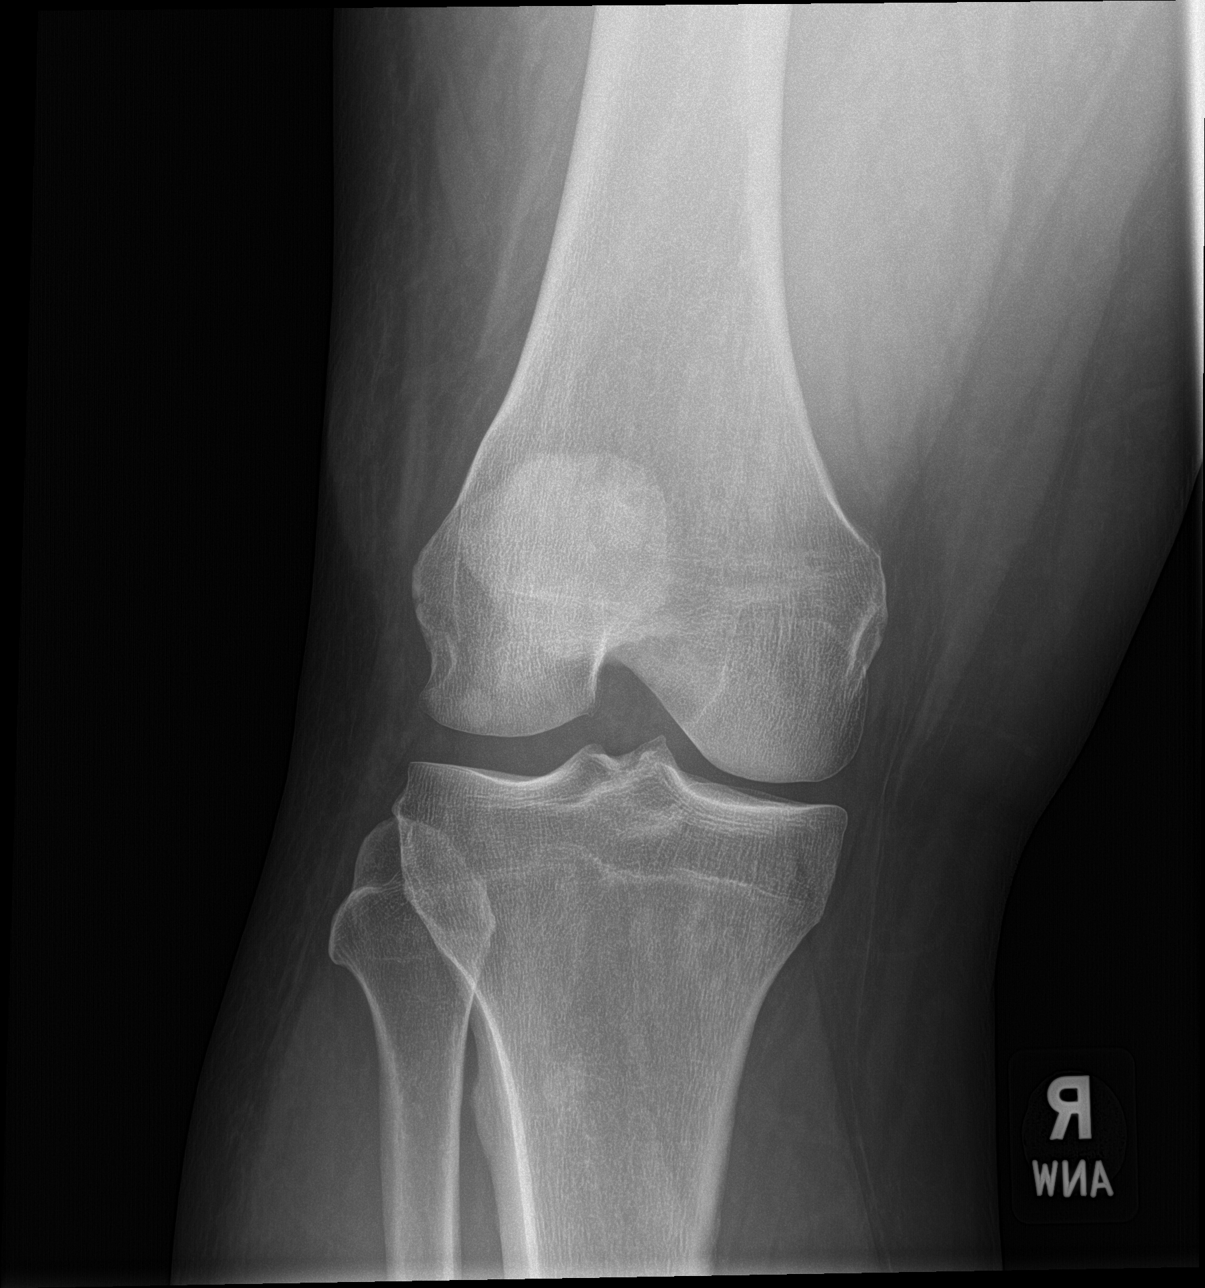

[patella sunrise]
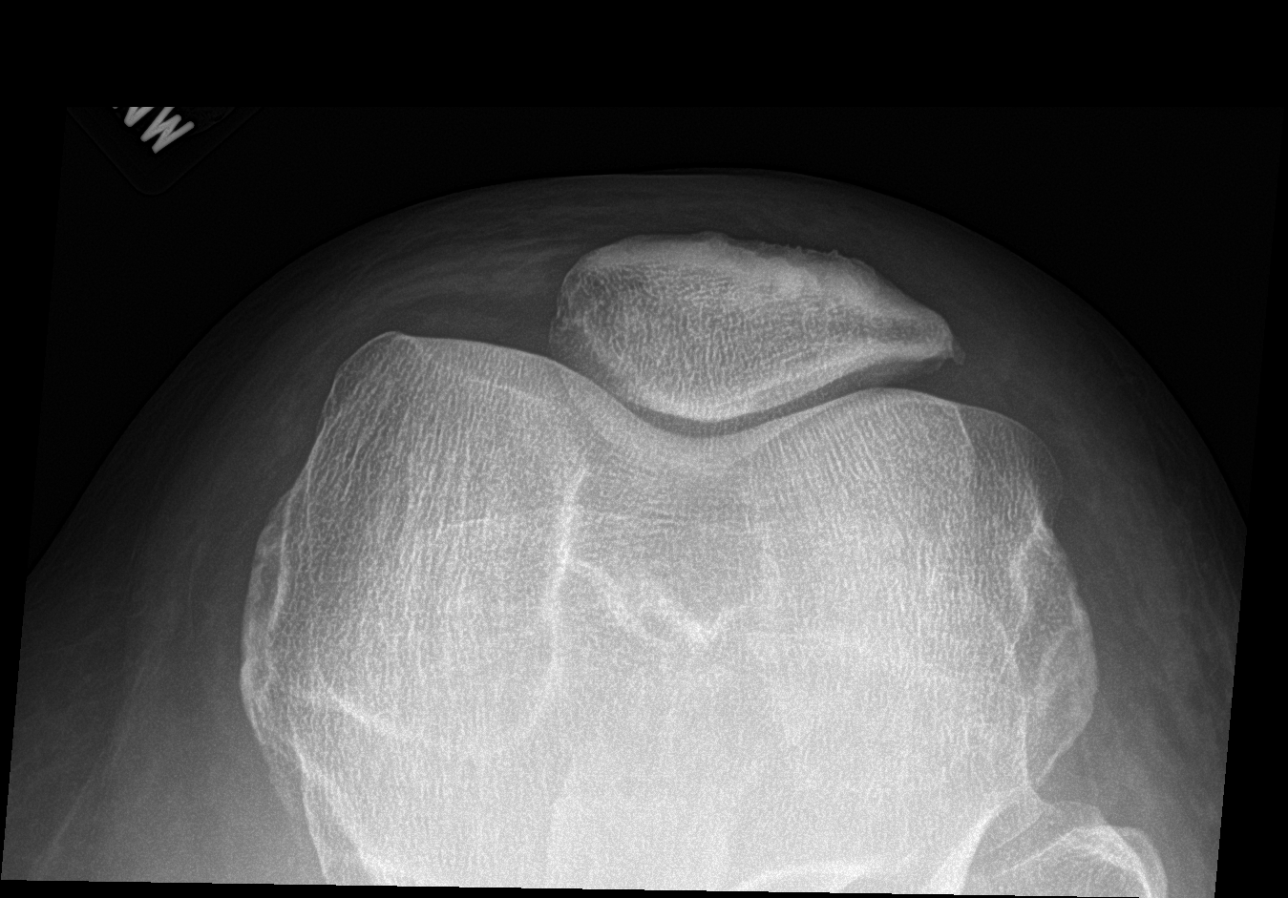

[knee lat]
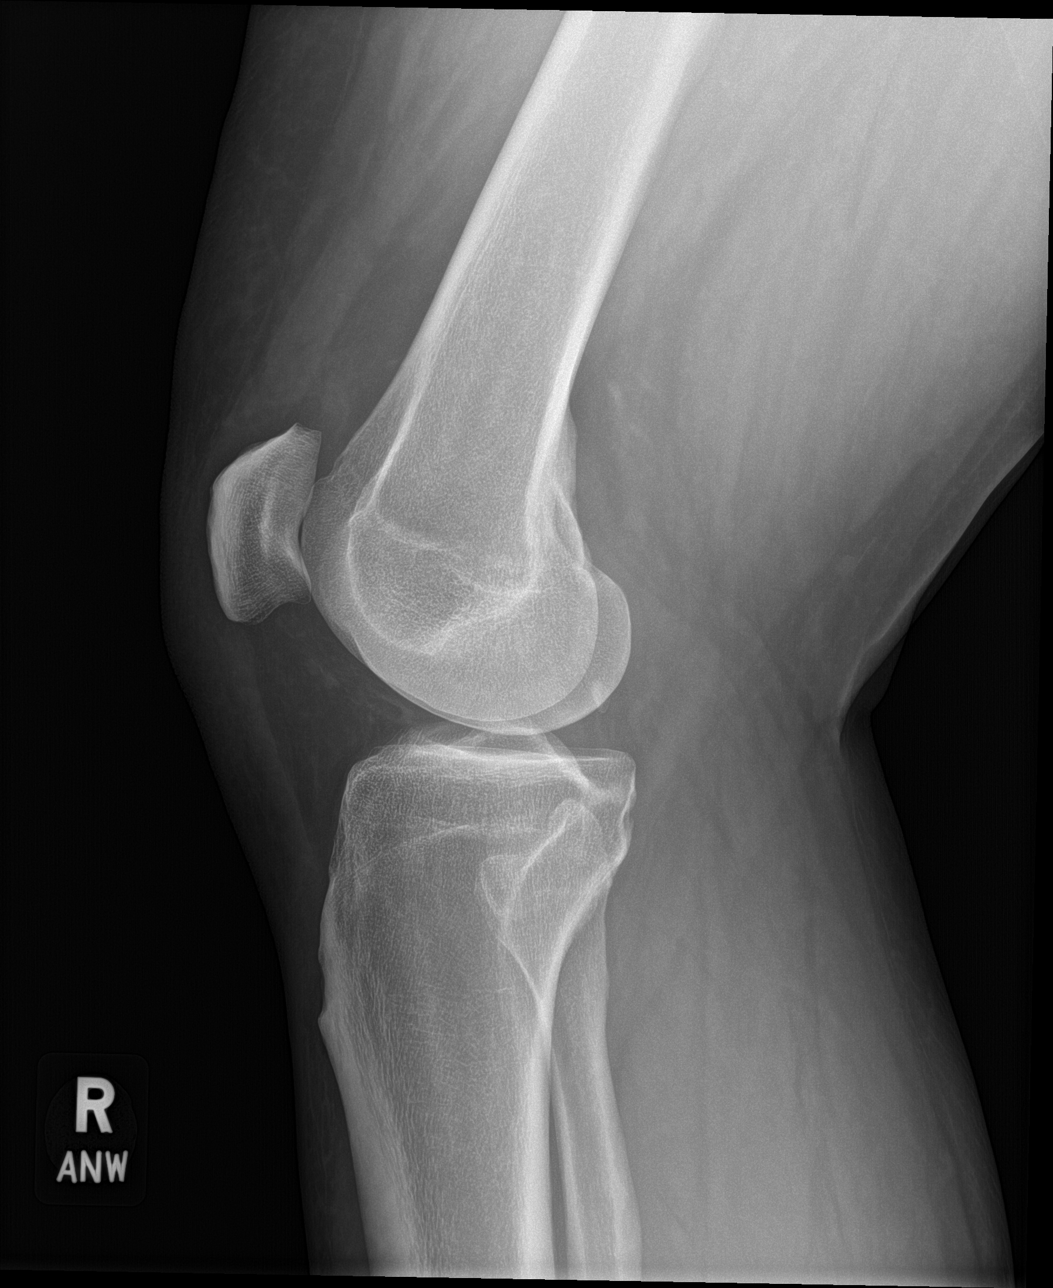

[4 of 4 positions shown; findings below may reference images not displayed]

FINDINGS: No evidence of fracture, dislocation, or joint effusion. No evidence
of arthropathy or other focal bone abnormality. Soft tissues are
unremarkable.
IMPRESSION: Negative.

## 2023-04-30 ENCOUNTER — Ambulatory Visit (INDEPENDENT_AMBULATORY_CARE_PROVIDER_SITE_OTHER): Payer: 59 | Admitting: Psychiatry

## 2023-04-30 ENCOUNTER — Other Ambulatory Visit: Payer: Self-pay | Admitting: Psychiatry

## 2023-04-30 DIAGNOSIS — Z91199 Patient's noncompliance with other medical treatment and regimen due to unspecified reason: Secondary | ICD-10-CM

## 2023-04-30 NOTE — Progress Notes (Signed)
 No show

## 2023-04-30 NOTE — Telephone Encounter (Signed)
 Has appt today

## 2023-05-07 ENCOUNTER — Other Ambulatory Visit: Payer: Self-pay | Admitting: Psychiatry

## 2023-05-07 DIAGNOSIS — F3342 Major depressive disorder, recurrent, in full remission: Secondary | ICD-10-CM

## 2023-05-07 DIAGNOSIS — F411 Generalized anxiety disorder: Secondary | ICD-10-CM

## 2023-05-07 DIAGNOSIS — F4010 Social phobia, unspecified: Secondary | ICD-10-CM

## 2023-05-07 NOTE — Telephone Encounter (Signed)
Call RS

## 2023-05-08 NOTE — Telephone Encounter (Signed)
Apt 4/1

## 2023-05-16 ENCOUNTER — Other Ambulatory Visit: Payer: Self-pay | Admitting: Psychiatry

## 2023-05-16 DIAGNOSIS — F3342 Major depressive disorder, recurrent, in full remission: Secondary | ICD-10-CM

## 2023-05-23 NOTE — Progress Notes (Signed)
 [facility] [address] [place]. [place] [address] [phone]       OFFICE FOLLOW UP NOTE  Mr. Kelly Johnston Date of Birth:  1970/07/23 Medical Record Number:  [id number]   Reason for visit: CPAP follow-up  Virtual Visit via Video Note  I connected with Kelly Johnston on 05/27/23 at  1:45 PM EDT by a video enabled telemedicine application and verified that I am speaking with the correct person using two identifiers.  Location: Patient: at home Provider: in office, [facility]   I discussed the limitations of evaluation and management by telemedicine and the availability of in person appointments. The patient expressed understanding and agreed to proceed.    SUBJECTIVE:   Follow-up visit:  Prior visit: 05/24/2022  Brief HPI:   Kelly Johnston is a 53 y.o. male who is being followed for OSA on CPAP.  Initially seen by Kelly Johnston 11/30/2020 with longstanding history of sleep apnea, received a new CPAP machine in 02/2021.  At prior visit, compliance report showed excellent compliance and optimal residual AHI.  Tolerating CPAP well.    Interval history:  Compliance report shows excellent usage and optimal residual AHI.  He continues to tolerate CPAP well.  Continues to get benefit in regards to sleep quality and daytime fatigue.  Up to date on supplies, purchases on [place].  No questions or concerns at this time.            ROS:   14 system review of systems performed and negative with exception of those listed in HPI  PMH:  Past Medical History:  Diagnosis Date   Anxiety    Benzodiazepine withdrawal (HCC)    Chronic kidney disease    protein builds up   Depression    Heart murmur    Hypertension    IgA nephropathy    Sleep apnea    cpap    PSH:  Past Surgical History:  Procedure Laterality Date   chest surgery     INCISION AND DRAINAGE ABSCESS  01/16/2012   Procedure: INCISION AND DRAINAGE ABSCESS;  Surgeon: Wilmon Arms. Corliss Skains, MD;   Location: [facility];  Service: General;  Laterality: Left;   PECTUS EXCAVATUM REPAIR      Social History:  Social History   Socioeconomic History   Marital status: Divorced    Spouse name: Not on file   Number of children: 3   Years of education: Not on file   Highest education level: Professional school degree (e.g., MD, DDS, DVM, JD)  Occupational History   Not on file  Tobacco Use   Smoking status: Never   Smokeless tobacco: Never  Vaping Use   Vaping status: Never Used  Substance and Sexual Activity   Alcohol use: Yes    Comment: OCC   Drug use: Not Currently    Types: Marijuana    Comment: last used 3 to 4 years ago   Sexual activity: Yes    Birth control/protection: Other-see comments  Other Topics Concern   Not on file  Social History Narrative   Not on file   Social Drivers of Health   Financial Resource Strain: Low Risk  (01/06/2018)   Overall Financial Resource Strain (CARDIA)    Difficulty of Paying Living Expenses: Not hard at all  Food Insecurity: No Food Insecurity (01/06/2018)   Hunger Vital Sign    Worried About Running Out of Food in the Last Year: Never true    Ran Out of Food in  the Last Year: Never true  Transportation Needs: No Transportation Needs (01/06/2018)   PRAPARE - Administrator, Civil Service (Medical): No    Lack of Transportation (Non-Medical): No  Physical Activity: Inactive (01/06/2018)   Exercise Vital Sign    Days of Exercise per Week: 0 days    Minutes of Exercise per Session: 0 min  Stress: Stress Concern Present (01/06/2018)   [name]    Feeling of Stress : Very much  Social Connections: Unknown (01/06/2018)   Social Connection and Isolation Panel [NHANES]    Frequency of Communication with Friends and Family: Not on file    Frequency of Social Gatherings with Friends and Family: Not on file    Attends Religious Services: Never    Active Member  of Clubs or Organizations: No    Attends Banker Meetings: Never    Marital Status: Divorced  Catering manager Violence: Not At Risk (01/06/2018)   Humiliation, Afraid, Rape, and Kick questionnaire    Fear of Current or Ex-Partner: No    Emotionally Abused: No    Physically Abused: No    Sexually Abused: No    Family History:  Family History  Problem Relation Age of Onset   Testicular cancer Father    Alcohol abuse Cousin    Sleep apnea Neg Hx    Colon cancer Neg Hx    Esophageal cancer Neg Hx    Rectal cancer Neg Hx    Stomach cancer Neg Hx     Medications:   Current Outpatient Medications on File Prior to Visit  Medication Sig Dispense Refill   liothyronine (CYTOMEL) 25 MCG tablet TAKE 1 TABLET BY MOUTH DAILY 90 tablet 0   lisinopril (ZESTRIL) 40 MG tablet TAKE 1 TABLET BY MOUTH DAILY 90 tablet 0   mirtazapine (REMERON) 45 MG tablet TAKE 1 TABLET BY MOUTH AT BEDTIME 90 tablet 1   OLANZapine (ZYPREXA) 7.5 MG tablet TAKE 1 TABLET BY MOUTH AT BEDTIME 30 tablet 0   Semaglutide,0.25 or 0.5MG /DOS, (OZEMPIC, 0.25 OR 0.5 MG/DOSE,) 2 MG/3ML SOPN Inject 0.25 mg into the skin once a week. 3 mL 1   sildenafil (VIAGRA) 100 MG tablet Take 1 tablet (100 mg total) by mouth daily as needed. 90 tablet 0   venlafaxine XR (EFFEXOR-XR) 75 MG 24 hr capsule TAKE THREE CAPSULES BY MOUTH EVERY MORNING WITH BREAKFAST 270 capsule 1   No current facility-administered medications on file prior to visit.    Allergies:   Allergies  Allergen Reactions   Latex Rash      OBJECTIVE:  Physical Exam  General: well developed, well nourished, very pleasant middle-age Caucasian male, seated, in no evident distress  Neurologic Exam Mental Status: Awake and fully alert. Oriented to place and time. Recent and remote memory intact. Attention span, concentration and fund of knowledge appropriate. Mood and affect appropriate.        ASSESSMENT: Kelly Johnston is a 53 y.o. year old male  with longstanding history of sleep apnea on CPAP.  Received new CPAP machine 02/2021.     PLAN:  OSA on CPAP : Compliance report shows excellent compliance and optimal residual AHI.  Encourage continued nightly CPAP use for full benefit and insurance purposes.  Continue set pressure of 11 with EPR level 3.  Continue to follow with DME company for any CPAP related concerns.    Follow up in 1 year via MyChart video visit or call  earlier if needed   CC:  PCP: de [place], Buren Kos, MD    I spent 15 minutes of face-to-face and non-face-to-face time with patient via MyChart video visit.  This included previsit chart review, lab review, study review, order entry, electronic health record documentation, patient education and discussion regarding above diagnoses and treatment plan and answered all other questions to patient's satisfaction   Ihor Austin, [facility]  [facility] [address] [place], [place] [address]  Phone [phone] Fax [phone] Note: This document was prepared with digital dictation and possible smart phrase technology. Any transcriptional errors that result from this process are unintentional.

## 2023-05-27 ENCOUNTER — Telehealth (INDEPENDENT_AMBULATORY_CARE_PROVIDER_SITE_OTHER): Payer: 59 | Admitting: Adult Health

## 2023-05-27 ENCOUNTER — Encounter: Payer: Self-pay | Admitting: Adult Health

## 2023-05-27 DIAGNOSIS — G4733 Obstructive sleep apnea (adult) (pediatric): Secondary | ICD-10-CM

## 2023-05-28 ENCOUNTER — Ambulatory Visit: Admitting: Psychiatry

## 2023-05-28 ENCOUNTER — Encounter: Payer: Self-pay | Admitting: Psychiatry

## 2023-05-28 DIAGNOSIS — F3342 Major depressive disorder, recurrent, in full remission: Secondary | ICD-10-CM | POA: Diagnosis not present

## 2023-05-28 DIAGNOSIS — F4010 Social phobia, unspecified: Secondary | ICD-10-CM | POA: Diagnosis not present

## 2023-05-28 DIAGNOSIS — R635 Abnormal weight gain: Secondary | ICD-10-CM

## 2023-05-28 DIAGNOSIS — F411 Generalized anxiety disorder: Secondary | ICD-10-CM

## 2023-05-28 DIAGNOSIS — T50905A Adverse effect of unspecified drugs, medicaments and biological substances, initial encounter: Secondary | ICD-10-CM

## 2023-05-28 MED ORDER — MIRTAZAPINE 45 MG PO TABS
45.0000 mg | ORAL_TABLET | Freq: Every day | ORAL | 1 refills | Status: DC
Start: 2023-05-28 — End: 2023-12-16

## 2023-05-28 MED ORDER — VENLAFAXINE HCL ER 75 MG PO CP24
225.0000 mg | ORAL_CAPSULE | Freq: Every day | ORAL | 1 refills | Status: DC
Start: 2023-05-28 — End: 2023-12-16

## 2023-05-28 MED ORDER — OLANZAPINE 7.5 MG PO TABS: 7.5000 mg | ORAL_TABLET | Freq: Every day | ORAL | 1 refills | Status: DC

## 2023-05-28 MED ORDER — LIOTHYRONINE SODIUM 25 MCG PO TABS: 25.0000 ug | ORAL_TABLET | Freq: Every day | ORAL | 1 refills | Status: DC

## 2023-05-28 NOTE — Progress Notes (Signed)
 Kelly Johnston [id number] 12-27-1970 53 y.o.  Subjective:   Patient ID:  Kelly Johnston is a 53 y.o. (DOB [birth year]) male.  Chief Complaint:  Chief Complaint  Patient presents with   Follow-up   Depression   Anxiety    Kelly Johnston Follow-up of severe depression with psychosis, altered mental status and anxiety requiring ECT  Att visit Jun 30, 2018.  Was completely in remission on May 4 when seen.  The only change since then has been discontinuing olanzapine. We completed weaned off olanzapine and discontinued it.  At follow-up visit August 06, 2018 he had relapsed with depression and rumination and anxiety.  Venlafaxine was increased to 225 mg daily.  Olanzapine was restarted at 5 mg nightly.  visit in November 2020 his depression and anxiety symptoms had largely resolved.  There were no med changes.  05/20/19 appt with the following noted: Still Johnston with depression.  Nothing has gotten worse.  Able to be productive and enjoy things.  Anxiety is managed and Ativan does a little without se.  Pleased with meds without SE. "More normal".  Hopeless resolved.  Quick improvement so probably the olanzapine helped.  Residual depression and anxiety are the same and not insurmountable.  Tears resolved.  Sleep without   Initial and terminal insomnia.  Some worry over job and finances.  Conc is better.  Caffeine not after 5.  1 diet coke in afternoon.  No RLS.   Almost never using Ativan. 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 Plan: no med changes  11/19/2019 appointment with the following noted: Good. No issues with depression.  Anxiety Johnston. No Ativan used Patient reports stable mood and denies depressed or irritable moods.  Patient denies any recent difficulty with anxiety.  Patient denies difficulty with sleep initiation or maintenance. Denies appetite disturbance.  Patient reports that energy and motivation have been good.  Patient denies any difficulty with  concentration.  Patient denies any suicidal ideation. No SE concerns. Plan no med changes  05/23/2020 appointment with the following noted:  doing good without relapses and satisfied with meds. Happier with career choice.  Makes a difference with mental health.  Doing real estate appraisals.  Staying busy.  Anxiety goodl Patient reports stable mood and denies depressed or irritable moods.  Patient denies any recent difficulty with anxiety.  Patient denies difficulty with sleep initiation or maintenance. Denies appetite disturbance.  Patient reports that energy and motivation have been good.  Patient denies any difficulty with concentration.  Patient denies any suicidal ideation. Wonders if binge eating disorder.  Uncontrollable urge even if not hungry.  Goes to extreme.  Wonders if started when depressed.  01/25/21 appt noted: Still good without relapses of depression since here. Work is Firefighter. Patient reports stable mood and denies depressed or irritable moods.  Patient denies any recent difficulty with anxiety.  Patient denies difficulty with sleep initiation or maintenance. Denies appetite disturbance.  Patient reports that energy and motivation have been good.  Patient denies any difficulty with concentration.  Patient denies any suicidal ideation. Satisfied with meds.  Disc Lybalvi approval. Tolerating the meds except for the weight gain.  He has not been taking Lybalvi yet.  We discussed the approval by the [place] Department of insurance Plan: Trial Libalvi in place of olanzapine After appeal to the [facility] of insurance the insurance company's denial of coverage was overturned.  The patient now has access to [facility]  and will start Lybalvi 5 mg in place of the olanzapine in order to help with weight loss.  07/25/2021 appointment with the following noted: Never got to take Lybalvi bc $4300/90 days. So been on the olanzapine 5 HS. Doing well.  Work is good, life and  family good. Better career fit for him. Patient reports stable mood and denies depressed or irritable moods.  Patient denies any recent difficulty with anxiety.  Patient denies difficulty with sleep initiation or maintenance. Denies appetite disturbance.  Patient reports that energy and motivation have been good.  Patient denies any difficulty with concentration.  Patient denies any suicidal ideation. Not much difference in appetite with Lybalvi when had samples. No concerns with meds. Plan: Trial Libalvi in place of olanzapine not helpful enough with appetite and weight using samples so will stick with olanzapine 5 mg nightly. Continue mirtazapine 45 mg nightly Continue venlafaxine XR's 225 mg daily TSH is suppressed at 0.152\in March 2023 given that he is been doing well for some time we can probably get by reducing the Cytomel.  We will reduce Cytomel from 50 mcg every morning to 37.5 mg nightly  01/25/22 appt noted:  Son at [employer] playing bball. Doing great.  Everything going well.  No depressive episodes since here.  Anxiety wihtout a problem.  8-10 hours sleep. No concerns with meds. No problems with reducing Cytomel to 25 mcg AM. Health is fine. No changes desired. Work is pretty good and enjoys it and still fairly busy.  Appraising.  Plan: continue Cytomel 25 mcg AM potentiator, Venlafaxine XR 225, mirtazapine 45, olanzapine 5 mg HS.  10/30/22 appt noted: Some general anxiety without reason with feeling of fear in the chest in the last 2-3 weeks.  Still plenty of sleep.  Would like to do something about anxity. Meds as above.  No new SE No sig dep.    No other concerns.  05/28/23 appt noted: Patient reports stable mood and denies depressed or irritable moods.  Patient denies any recent difficulty with anxiety.  Patient denies difficulty with sleep initiation or maintenance. Denies appetite disturbance.  Patient reports that energy and motivation have been good.  Patient denies  any difficulty with concentration.  Patient denies any suicidal ideation. Anxiety resolved.  No dep episodeds. No new SE  He had a psych hosp at [facility] late 2019.  Admitted with hallucinations, delusions and memory problems and had accidentally stopped psych meds.  He had a mixture of symptoms that was diagnosed is perhaps delirium related to benzodiazepine withdrawal as well as depression with psychotic features having just had an ECT treatment.  He was hospitalized at [facility] from December 3 until December 5.    He completed ECT treatment .  Prior psychiatric meds  lithium,  venlafaxine XR 225 , duloxetine 120, fluoxetine 60, Wellbutrin 450, Viibryd, c Abilify, Cytomel, Rexulti 2 mg, mirtazapine 45 mg, olanzapine 10 helpful for anxiety. 2 courses of ECT with the last completed December 2019  Review of Systems:   No tremor.  No weakness. Started losing weight at gym. Working on [employer]. No palpitations, no chest pain No tremor.  No confusion.  No headaches.  No balance problems.  No sedation.  Medications: I have reviewed the patient's current medications.  Current Outpatient Medications  Medication Sig Dispense Refill   lisinopril (ZESTRIL) 40 MG tablet TAKE 1 TABLET BY MOUTH DAILY 90 tablet 0   Semaglutide,0.25 or 0.5MG /DOS, (OZEMPIC, 0.25 OR 0.5 MG/DOSE,) 2 MG/3ML SOPN Inject  0.25 mg into the skin once a week. 3 mL 1   sildenafil (VIAGRA) 100 MG tablet Take 1 tablet (100 mg total) by mouth daily as needed. 90 tablet 0   liothyronine (CYTOMEL) 25 MCG tablet Take 1 tablet (25 mcg total) by mouth daily. 90 tablet 1   mirtazapine (REMERON) 45 MG tablet Take 1 tablet (45 mg total) by mouth at bedtime. 90 tablet 1   OLANZapine (ZYPREXA) 7.5 MG tablet Take 1 tablet (7.5 mg total) by mouth at bedtime. 90 tablet 1   venlafaxine XR (EFFEXOR-XR) 75 MG 24 hr capsule Take 3 capsules (225 mg total) by mouth daily with breakfast. 270 capsule 1   No current facility-administered medications  for this visit.     Medication Side Effects: None really noted.  Allergies:  Allergies  Allergen Reactions   Latex Rash    Past Medical History:  Diagnosis Date   Anxiety    Benzodiazepine withdrawal (HCC)    Chronic kidney disease    protein builds up   Depression    Heart murmur    Hypertension    IgA nephropathy    Sleep apnea    cpap    Family History  Problem Relation Age of Onset   Testicular cancer Father    Alcohol abuse Cousin    Sleep apnea Neg Hx    Colon cancer Neg Hx    Esophageal cancer Neg Hx    Rectal cancer Neg Hx    Stomach cancer Neg Hx     Social History   Socioeconomic History   Marital status: Divorced    Spouse name: Not on file   Number of children: 3   Years of education: Not on file   Highest education level: Professional school degree (e.g., MD, DDS, DVM, JD)  Occupational History   Not on file  Tobacco Use   Smoking status: Never   Smokeless tobacco: Never  Vaping Use   Vaping status: Never Used  Substance and Sexual Activity   Alcohol use: Yes    Comment: OCC   Drug use: Not Currently    Types: Marijuana    Comment: last used 3 to 4 years ago   Sexual activity: Yes    Birth control/protection: Other-see comments  Other Topics Concern   Not on file  Social History Narrative   Not on file   Social Drivers of Health   Financial Resource Strain: Low Risk  (01/06/2018)   Overall Financial Resource Strain (CARDIA)    Difficulty of Paying Living Expenses: Not hard at all  Food Insecurity: No Food Insecurity (01/06/2018)   Hunger Vital Sign    Worried About Running Out of Food in the Last Year: Never true    Ran Out of Food in the Last Year: Never true  Transportation Needs: No Transportation Needs (01/06/2018)   PRAPARE - Administrator, Civil Service (Medical): No    Lack of Transportation (Non-Medical): No  Physical Activity: Inactive (01/06/2018)   Exercise Vital Sign    Days of Exercise per Week: 0  days    Minutes of Exercise per Session: 0 min  Stress: Stress Concern Present (01/06/2018)   [name]    Feeling of Stress : Very much  Social Connections: Unknown (01/06/2018)   Social Connection and Isolation Panel [NHANES]    Frequency of Communication with Friends and Family: Not on file    Frequency of Social Gatherings with Friends  and Family: Not on file    Attends Religious Services: Never    Active Member of Clubs or Organizations: No    Attends Banker Meetings: Never    Marital Status: Divorced  Catering manager Violence: Not At Risk (01/06/2018)   Humiliation, Afraid, Rape, and Kick questionnaire    Fear of Current or Ex-Partner: No    Emotionally Abused: No    Physically Abused: No    Sexually Abused: No    Past Medical History, Surgical history, Social history, and Family history were reviewed and updated as appropriate.   Please see review of systems for further details on the patient's review from today.   Objective:   Physical Exam:  There were no vitals taken for this visit.  Physical Exam Constitutional:      General: He is not in acute distress.    Appearance: He is well-developed. He is obese.  Musculoskeletal:        General: No deformity.  Neurological:     Mental Status: He is alert and oriented to person, place, and time.     Cranial Nerves: No dysarthria.     Coordination: Coordination normal.  Psychiatric:        Attention and Perception: Attention and perception normal. He does not perceive auditory or visual hallucinations.        Mood and Affect: Mood is not anxious or depressed. Affect is not labile, blunt, angry or inappropriate.        Speech: Speech normal.        Behavior: Behavior normal. Behavior is cooperative.        Thought Content: Thought content normal. Thought content is not paranoid or delusional. Thought content does not include homicidal or  suicidal ideation. Thought content does not include homicidal or suicidal plan.        Cognition and Memory: Cognition and memory normal.        Judgment: Judgment normal.     Comments:  Insight good no evidence for psychosis     Lab Review:     Component Value Date/Time   NA 135 05/24/2020 1648   K 4.2 05/24/2020 1648   CL 100 05/24/2020 1648   CO2 21 (L) 05/24/2020 1648   GLUCOSE 77 05/24/2020 1648   BUN 15 05/24/2020 1648   CREATININE 1.18 05/24/2020 1648   CREATININE 1.82 (H) 11/03/2013 1613   CALCIUM 8.9 05/24/2020 1648   PROT 6.9 05/08/2022 1040   ALBUMIN 3.7 05/08/2022 1040   AST 13 05/08/2022 1040   ALT 14 05/08/2022 1040   ALKPHOS 92 05/08/2022 1040   BILITOT 0.2 05/08/2022 1040   GFRNONAA >60 05/24/2020 1648   GFRNONAA 44 (L) 11/03/2013 1613   GFRAA >60 01/09/2018 1513   GFRAA 51 (L) 11/03/2013 1613       Component Value Date/Time   WBC 8.8 04/27/2021 1511   WBC 8.9 01/09/2018 1513   RBC 4.59 04/27/2021 1511   RBC 5.29 01/09/2018 1513   HGB 14.3 04/27/2021 1511   HCT 41.8 04/27/2021 1511   PLT 320 04/27/2021 1511   MCV 91 04/27/2021 1511   MCH 31.2 04/27/2021 1511   MCH 30.6 01/09/2018 1513   MCHC 34.2 04/27/2021 1511   MCHC 32.3 01/09/2018 1513   RDW 12.9 04/27/2021 1511   LYMPHSABS 2.0 04/27/2021 1511   MONOABS 1.3 (H) 05/30/2016 0409   EOSABS 0.1 04/27/2021 1511   BASOSABS 0.1 04/27/2021 1511    No results found for: "POCLITH", "LITHIUM"  No results found for: "PHENYTOIN", "PHENOBARB", "VALPROATE", "CBMZ"   .res Assessment: Plan:    Major depression, recurrent, full remission (HCC) - Plan: liothyronine (CYTOMEL) 25 MCG tablet, mirtazapine (REMERON) 45 MG tablet, OLANZapine (ZYPREXA) 7.5 MG tablet, venlafaxine XR (EFFEXOR-XR) 75 MG 24 hr capsule  Generalized anxiety disorder - Plan: OLANZapine (ZYPREXA) 7.5 MG tablet  Social anxiety disorder - Plan: OLANZapine (ZYPREXA) 7.5 MG tablet, venlafaxine XR (EFFEXOR-XR) 75 MG 24 hr  capsule  Weight gain due to medication   Kelly Johnston has had a severe episode of depression with psychotic features and anxiety and cognitive impairment which has responded to ECT plus medication changes.  He  completed ECT.  He was markedly better and in remission at his visit on May 4.  At that time we discontinued olanzapine and he had relapsed severely.  So we restarted olanzapine 5 mg daily and increase venlafaxine XR to 225 mg daily.  SX resolved again with olanzapine added. At this point his symptoms of depression and anxiety are  resolved   Tolerating meds except for the weight gain.  depression well managed.  He enjoys his new job.  Less anxiety with Increase olanzapine 7.5 mg nightly..  Discussed potential metabolic side effects associated with atypical antipsychotics, as well as potential risk for movement side effects. Advised pt to contact office if movement side effects occur.   Consider Ozempic for weight loss.  He could not get this due to the cost.  Continue mirtazapine 45 mg nightly Continue venlafaxine XR's 225 mg daily He asks we refill lisinopril.  BP is good. Reduce Cytomel 25 mg AM  Call if there is any worsening of symptoms  FU 6 mos  Meredith Staggers, MD, DFAPA   Future Appointments  Date Time Provider Department Center  12/16/2023  1:30 PM Cottle, Steva Ready., MD [facility] None  05/28/2024  2:30 PM Ihor Austin, NP [facility] None    No orders of the defined types were placed in this encounter.     -------------------------------

## 2023-05-29 ENCOUNTER — Telehealth: Payer: Self-pay

## 2023-08-07 ENCOUNTER — Other Ambulatory Visit: Payer: Self-pay | Admitting: Psychiatry

## 2023-09-04 ENCOUNTER — Other Ambulatory Visit: Payer: Self-pay | Admitting: Psychiatry

## 2023-12-05 ENCOUNTER — Other Ambulatory Visit: Payer: Self-pay | Admitting: Psychiatry

## 2023-12-15 ENCOUNTER — Other Ambulatory Visit: Payer: Self-pay | Admitting: Psychiatry

## 2023-12-15 DIAGNOSIS — F411 Generalized anxiety disorder: Secondary | ICD-10-CM

## 2023-12-15 DIAGNOSIS — F3342 Major depressive disorder, recurrent, in full remission: Secondary | ICD-10-CM

## 2023-12-15 DIAGNOSIS — F4010 Social phobia, unspecified: Secondary | ICD-10-CM

## 2023-12-16 ENCOUNTER — Ambulatory Visit: Admitting: Psychiatry

## 2023-12-16 ENCOUNTER — Encounter: Payer: Self-pay | Admitting: Psychiatry

## 2023-12-16 DIAGNOSIS — F3342 Major depressive disorder, recurrent, in full remission: Secondary | ICD-10-CM | POA: Diagnosis not present

## 2023-12-16 DIAGNOSIS — F4010 Social phobia, unspecified: Secondary | ICD-10-CM

## 2023-12-16 DIAGNOSIS — F411 Generalized anxiety disorder: Secondary | ICD-10-CM | POA: Diagnosis not present

## 2023-12-16 MED ORDER — OLANZAPINE 7.5 MG PO TABS: 7.5000 mg | ORAL_TABLET | Freq: Every day | ORAL | 2 refills | Status: AC

## 2023-12-16 MED ORDER — MIRTAZAPINE 45 MG PO TABS: 45.0000 mg | ORAL_TABLET | Freq: Every day | ORAL | 2 refills | Status: AC

## 2023-12-16 MED ORDER — VENLAFAXINE HCL ER 75 MG PO CP24: 225.0000 mg | ORAL_CAPSULE | Freq: Every day | ORAL | 2 refills | Status: AC

## 2023-12-16 NOTE — Progress Notes (Signed)
 [name] [id number] Apr 16, 1970 53 y.o.  Subjective:   Patient ID:  Kelly Johnston is a 53 y.o. (DOB 1970/10/30) male.  Chief Complaint:  Chief Complaint  Patient presents with   Follow-up    ELYA DILORETO Follow-up of severe depression with psychosis, altered mental status and anxiety requiring ECT  Att visit Jun 30, 2018.  Was completely in remission on May 4 when seen.  The only change since then has been discontinuing olanzapine . We completed weaned off olanzapine  and discontinued it.  At follow-up visit August 06, 2018 he had relapsed with depression and rumination and anxiety.  Venlafaxine  was increased to 225 mg daily.  Olanzapine  was restarted at 5 mg nightly.  visit in November 2020 his depression and anxiety symptoms had largely resolved.  There were no med changes.  05/20/19 appt with the following noted: Still OK with depression.  Nothing has gotten worse.  Able to be productive and enjoy things.  Anxiety is managed and Ativan  does a little without se.  Pleased with meds without SE. More normal.  Hopeless resolved.  Quick improvement so probably the olanzapine  helped.  Residual depression and anxiety are the same and not insurmountable.  Tears resolved.  Sleep without   Initial and terminal insomnia.  Some worry over job and finances.  Conc is better.  Caffeine not after 5.  1 diet coke in afternoon.  No RLS.   Almost never using Ativan . 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 Plan: no med changes  11/19/2019 appointment with the following noted: Good. No issues with depression.  Anxiety OK. No Ativan  used Patient reports stable mood and denies depressed or irritable moods.  Patient denies any recent difficulty with anxiety.  Patient denies difficulty with sleep initiation or maintenance. Denies appetite disturbance.  Patient reports that energy and motivation have been good.  Patient denies any difficulty with concentration.  Patient  denies any suicidal ideation. No SE concerns. Plan no med changes  05/23/2020 appointment with the following noted:  doing good without relapses and satisfied with meds. Happier with career choice.  Makes a difference with mental health.  Doing real estate appraisals.  Staying busy.  Anxiety goodl Patient reports stable mood and denies depressed or irritable moods.  Patient denies any recent difficulty with anxiety.  Patient denies difficulty with sleep initiation or maintenance. Denies appetite disturbance.  Patient reports that energy and motivation have been good.  Patient denies any difficulty with concentration.  Patient denies any suicidal ideation. Wonders if binge eating disorder.  Uncontrollable urge even if not hungry.  Goes to extreme.  Wonders if started when depressed.  01/25/21 appt noted: Still good without relapses of depression since here. Work is Firefighter. Patient reports stable mood and denies depressed or irritable moods.  Patient denies any recent difficulty with anxiety.  Patient denies difficulty with sleep initiation or maintenance. Denies appetite disturbance.  Patient reports that energy and motivation have been good.  Patient denies any difficulty with concentration.  Patient denies any suicidal ideation. Satisfied with meds.  Disc Lybalvi  approval. Tolerating the meds except for the weight gain.  He has not been taking Lybalvi  yet.  We discussed the approval by the [place]  Department of insurance Plan: Trial Libalvi in place of olanzapine  After appeal to the [place]  Department of insurance the insurance company's denial of coverage was overturned.  The patient now has access to Lybalvi  and will start Lybalvi  5 mg  in place of the olanzapine  in order to help with weight loss.  07/25/2021 appointment with the following noted: Never got to take Lybalvi  bc $4300/90 days. So been on the olanzapine  5 HS. Doing well.  Work is good, life and family good. Better  career fit for him. Patient reports stable mood and denies depressed or irritable moods.  Patient denies any recent difficulty with anxiety.  Patient denies difficulty with sleep initiation or maintenance. Denies appetite disturbance.  Patient reports that energy and motivation have been good.  Patient denies any difficulty with concentration.  Patient denies any suicidal ideation. Not much difference in appetite with Lybalvi  when had samples. No concerns with meds. Plan: Trial Libalvi in place of olanzapine  not helpful enough with appetite and weight using samples so will stick with olanzapine  5 mg nightly. Continue mirtazapine  45 mg nightly Continue venlafaxine  XR's 225 mg daily TSH is suppressed at 0.152\in March 2023 given that he is been doing well for some time we can probably get by reducing the Cytomel .  We will reduce Cytomel  from 50 mcg every morning to 37.5 mg nightly  01/25/22 appt noted:  Son at [employer] playing bball. Doing great.  Everything going well.  No depressive episodes since here.  Anxiety wihtout a problem.  8-10 hours sleep. No concerns with meds. No problems with reducing Cytomel  to 25 mcg AM. Health is fine. No changes desired. Work is pretty good and enjoys it and still fairly busy.  Appraising.  Plan: continue Cytomel  25 mcg AM potentiator, Venlafaxine  XR 225, mirtazapine  45, olanzapine  7.5 mg HS.  10/30/22 appt noted: Some general anxiety without reason with feeling of fear in the chest in the last 2-3 weeks.  Still plenty of sleep.  Would like to do something about anxity. Meds as above.  No new SE No sig dep.    No other concerns.  05/28/23 appt noted: Patient reports stable mood and denies depressed or irritable moods.  Patient denies any recent difficulty with anxiety.  Patient denies difficulty with sleep initiation or maintenance. Denies appetite disturbance.  Patient reports that energy and motivation have been good.  Patient denies any difficulty with  concentration.  Patient denies any suicidal ideation. Anxiety resolved.  No dep episodeds. No new SE  12/16/23 appt noted:  Med: Cytomel  25 mcg AM potentiator, Venlafaxine  XR 225, mirtazapine  45, olanzapine  7.5 mg HS.  No changes.  Compounded Wegovy  2.5 mg.  (From [facility]). Things are good.  Mood and anxiety managed.  SE wt gain.  None other issues Dieting and slowly losing wt.  On Wegovy .  No changes desired.  Pscych hx:  He had a psych hosp at [facility] late 2019.  Admitted with hallucinations, delusions and memory problems and had accidentally stopped psych meds.  He had a mixture of symptoms that was diagnosed is perhaps delirium related to benzodiazepine withdrawal as well as depression with psychotic features having just had an ECT treatment.  He was hospitalized at [facility] from December 3 until December 5.    He completed ECT treatment .  Prior psychiatric meds  lithium ,  venlafaxine  XR 225 , duloxetine  120, fluoxetine 60, Wellbutrin 450, Viibryd, c Abilify, Cytomel , Rexulti 2 mg, mirtazapine  45 mg, olanzapine  10 helpful for anxiety. 2 courses of ECT with the last completed December 2019  Review of Systems:   No tremor.  No weakness. Started losing weight at gym. Working on [employer]. No palpitations, no chest pain No tremor.  No confusion.  No  headaches.  No balance problems.  No sedation.  Medications: I have reviewed the patient's current medications.  Current Outpatient Medications  Medication Sig Dispense Refill   liothyronine  (CYTOMEL ) 25 MCG tablet Take 1 tablet (25 mcg total) by mouth daily. 90 tablet 1   lisinopril  (ZESTRIL ) 40 MG tablet TAKE 1 TABLET BY MOUTH DAILY 90 tablet 0   Semaglutide ,0.25 or 0.5MG /DOS, (OZEMPIC , 0.25 OR 0.5 MG/DOSE,) 2 MG/3ML SOPN Inject 0.25 mg into the skin once a week. 3 mL 1   sildenafil  (VIAGRA ) 100 MG tablet Take 1 tablet (100 mg total) by mouth daily as needed. 90 tablet 0   mirtazapine  (REMERON ) 45 MG tablet Take 1 tablet (45 mg total)  by mouth at bedtime. 90 tablet 2   OLANZapine  (ZYPREXA ) 7.5 MG tablet Take 1 tablet (7.5 mg total) by mouth at bedtime. 90 tablet 2   venlafaxine  XR (EFFEXOR -XR) 75 MG 24 hr capsule Take 3 capsules (225 mg total) by mouth daily with breakfast. 270 capsule 2   No current facility-administered medications for this visit.     Medication Side Effects: None really noted.  Allergies:  Allergies  Allergen Reactions   Latex Rash    Past Medical History:  Diagnosis Date   Anxiety    Benzodiazepine withdrawal (HCC)    Chronic kidney disease    protein builds up   Depression    Heart murmur    Hypertension    IgA nephropathy    Sleep apnea    cpap    Family History  Problem Relation Age of Onset   Testicular cancer Father    Alcohol abuse Cousin    Sleep apnea Neg Hx    Colon cancer Neg Hx    Esophageal cancer Neg Hx    Rectal cancer Neg Hx    Stomach cancer Neg Hx     Social History   Socioeconomic History   Marital status: Divorced    Spouse name: Not on file   Number of children: 3   Years of education: Not on file   Highest education level: Professional school degree (e.g., MD, DDS, DVM, JD)  Occupational History   Not on file  Tobacco Use   Smoking status: Never   Smokeless tobacco: Never  Vaping Use   Vaping status: Never Used  Substance and Sexual Activity   Alcohol use: Yes    Comment: OCC   Drug use: Not Currently    Types: Marijuana    Comment: last used 3 to 4 years ago   Sexual activity: Yes    Birth control/protection: Other-see comments  Other Topics Concern   Not on file  Social History Narrative   Not on file   Social Drivers of Health   Financial Resource Strain: Low Risk  (01/06/2018)   Overall Financial Resource Strain (CARDIA)    Difficulty of Paying Living Expenses: Not hard at all  Food Insecurity: No Food Insecurity (01/06/2018)   Hunger Vital Sign    Worried About Running Out of Food in the Last Year: Never true    Ran Out of  Food in the Last Year: Never true  Transportation Needs: No Transportation Needs (01/06/2018)   PRAPARE - Administrator, Civil Service (Medical): No    Lack of Transportation (Non-Medical): No  Physical Activity: Inactive (01/06/2018)   Exercise Vital Sign    Days of Exercise per Week: 0 days    Minutes of Exercise per Session: 0 min  Stress: Stress Concern Present (  01/06/2018)   [place] [employer] - Occupational Stress Questionnaire    Feeling of Stress : Very much  Social Connections: Unknown (01/06/2018)   Social Connection and Isolation Panel    Frequency of Communication with Friends and Family: Not on file    Frequency of Social Gatherings with Friends and Family: Not on file    Attends Religious Services: Never    Active Member of Clubs or Organizations: No    Attends Banker Meetings: Never    Marital Status: Divorced  Catering manager Violence: Not At Risk (01/06/2018)   Humiliation, Afraid, Rape, and Kick questionnaire    Fear of Current or Ex-Partner: No    Emotionally Abused: No    Physically Abused: No    Sexually Abused: No    Past Medical History, Surgical history, Social history, and Family history were reviewed and updated as appropriate.   Please see review of systems for further details on the patient's review from today.   Objective:   Physical Exam:  There were no vitals taken for this visit.  Physical Exam Constitutional:      General: He is not in acute distress.    Appearance: He is well-developed. He is obese.  Musculoskeletal:        General: No deformity.  Neurological:     Mental Status: He is alert and oriented to person, place, and time.     Cranial Nerves: No dysarthria.     Coordination: Coordination normal.  Psychiatric:        Attention and Perception: Attention and perception normal. He does not perceive auditory or visual hallucinations.        Mood and Affect: Mood is not anxious or  depressed. Affect is not labile, blunt, angry or inappropriate.        Speech: Speech normal.        Behavior: Behavior normal. Behavior is cooperative.        Thought Content: Thought content normal. Thought content is not paranoid or delusional. Thought content does not include homicidal or suicidal ideation. Thought content does not include homicidal or suicidal plan.        Cognition and Memory: Cognition and memory normal.        Judgment: Judgment normal.     Comments:  Insight good no evidence for psychosis     Lab Review:     Component Value Date/Time   NA 135 05/24/2020 1648   K 4.2 05/24/2020 1648   CL 100 05/24/2020 1648   CO2 21 (L) 05/24/2020 1648   GLUCOSE 77 05/24/2020 1648   BUN 15 05/24/2020 1648   CREATININE 1.18 05/24/2020 1648   CREATININE 1.82 (H) 11/03/2013 1613   CALCIUM 8.9 05/24/2020 1648   PROT 6.9 05/08/2022 1040   ALBUMIN 3.7 05/08/2022 1040   AST 13 05/08/2022 1040   ALT 14 05/08/2022 1040   ALKPHOS 92 05/08/2022 1040   BILITOT 0.2 05/08/2022 1040   GFRNONAA >60 05/24/2020 1648   GFRNONAA 44 (L) 11/03/2013 1613   GFRAA >60 01/09/2018 1513   GFRAA 51 (L) 11/03/2013 1613       Component Value Date/Time   WBC 8.8 04/27/2021 1511   WBC 8.9 01/09/2018 1513   RBC 4.59 04/27/2021 1511   RBC 5.29 01/09/2018 1513   HGB 14.3 04/27/2021 1511   HCT 41.8 04/27/2021 1511   PLT 320 04/27/2021 1511   MCV 91 04/27/2021 1511   MCH 31.2 04/27/2021 1511   MCH 30.6  01/09/2018 1513   MCHC 34.2 04/27/2021 1511   MCHC 32.3 01/09/2018 1513   RDW 12.9 04/27/2021 1511   LYMPHSABS 2.0 04/27/2021 1511   MONOABS 1.3 (H) 05/30/2016 0409   EOSABS 0.1 04/27/2021 1511   BASOSABS 0.1 04/27/2021 1511    No results found for: POCLITH, LITHIUM    No results found for: PHENYTOIN, PHENOBARB, VALPROATE, CBMZ   .res Assessment: Plan:    Major depression, recurrent, full remission - Plan: venlafaxine  XR (EFFEXOR -XR) 75 MG 24 hr capsule, OLANZapine  (ZYPREXA )  7.5 MG tablet, mirtazapine  (REMERON ) 45 MG tablet  Social anxiety disorder - Plan: venlafaxine  XR (EFFEXOR -XR) 75 MG 24 hr capsule, OLANZapine  (ZYPREXA ) 7.5 MG tablet  Generalized anxiety disorder - Plan: OLANZapine  (ZYPREXA ) 7.5 MG tablet   Mr. Prats has had a severe episode of depression with psychotic features and anxiety and cognitive impairment which has responded to ECT plus medication changes.  He  completed ECT.  He was markedly better and in remission at his visit on May 4.  At that time we discontinued olanzapine  and he had relapsed severely.  So we restarted olanzapine  5 mg daily and increase venlafaxine  XR to 225 mg daily.  SX resolved again with olanzapine  added. At this point his symptoms of depression and anxiety are  resolved   Tolerating meds except for the weight gain.  depression well managed.  He enjoys his new job.  Less anxiety with Increase olanzapine  7.5 mg nightly..  Discussed potential metabolic side effects associated with atypical antipsychotics, as well as potential risk for movement side effects. Advised pt to contact office if movement side effects occur.   Disc Glp-1's for wt  Continue mirtazapine  45 mg nightly Continue venlafaxine  XR's 225 mg daily He asks we refill lisinopril .  BP is good. Reduce Cytomel  25 mg AM  Call if there is any worsening of symptoms  FU 6 mos  Lorene Macintosh, MD, DFAPA   Future Appointments  Date Time Provider Department Center  05/28/2024  2:30 PM Whitfield Raisin, NP [facility] None  09/14/2024  3:30 PM Cottle, Lorene KANDICE Raddle., MD [facility] None    No orders of the defined types were placed in this encounter.     -------------------------------

## 2024-02-11 ENCOUNTER — Other Ambulatory Visit: Payer: Self-pay | Admitting: Psychiatry

## 2024-02-11 DIAGNOSIS — F3342 Major depressive disorder, recurrent, in full remission: Secondary | ICD-10-CM

## 2024-03-12 ENCOUNTER — Other Ambulatory Visit: Payer: Self-pay | Admitting: Psychiatry

## 2024-05-28 ENCOUNTER — Telehealth: Admitting: Adult Health

## 2024-09-14 ENCOUNTER — Ambulatory Visit: Admitting: Psychiatry
